# Patient Record
Sex: Male | Born: 1945
Health system: Southern US, Community
[De-identification: ages and names within clinical notes are randomized; demographics above are authoritative.]

## PROBLEM LIST (undated history)

## (undated) DIAGNOSIS — K769 Liver disease, unspecified: Secondary | ICD-10-CM

## (undated) DIAGNOSIS — N400 Enlarged prostate without lower urinary tract symptoms: Secondary | ICD-10-CM

## (undated) DIAGNOSIS — I10 Essential (primary) hypertension: Secondary | ICD-10-CM

## (undated) DIAGNOSIS — K219 Gastro-esophageal reflux disease without esophagitis: Secondary | ICD-10-CM

## (undated) DIAGNOSIS — M1990 Unspecified osteoarthritis, unspecified site: Secondary | ICD-10-CM

## (undated) DIAGNOSIS — M109 Gout, unspecified: Secondary | ICD-10-CM

## (undated) DIAGNOSIS — E039 Hypothyroidism, unspecified: Secondary | ICD-10-CM

## (undated) DIAGNOSIS — M542 Cervicalgia: Secondary | ICD-10-CM

## (undated) DIAGNOSIS — R2689 Other abnormalities of gait and mobility: Secondary | ICD-10-CM

## (undated) DIAGNOSIS — K5792 Diverticulitis of intestine, part unspecified, without perforation or abscess without bleeding: Secondary | ICD-10-CM

## (undated) HISTORY — PX: CHOLECYSTECTOMY: SHX55

## (undated) HISTORY — DX: Cervicalgia: M54.2

## (undated) HISTORY — PX: HERNIA REPAIR: SHX51

## (undated) HISTORY — DX: Liver disease, unspecified: K76.9

## (undated) HISTORY — DX: Other abnormalities of gait and mobility: R26.89

---

## 2010-06-01 ENCOUNTER — Emergency Department (HOSPITAL_COMMUNITY): Admission: EM | Admit: 2010-06-01 | Discharge: 2010-06-01 | Payer: Self-pay | Admitting: Emergency Medicine

## 2014-04-30 ENCOUNTER — Emergency Department (HOSPITAL_COMMUNITY)
Admission: EM | Admit: 2014-04-30 | Discharge: 2014-05-01 | Disposition: A | Discharge status: Home / Self Care | Payer: Medicare Other | Attending: Emergency Medicine | Admitting: Emergency Medicine

## 2014-04-30 DIAGNOSIS — R1013 Epigastric pain: Secondary | ICD-10-CM | POA: Insufficient documentation

## 2014-04-30 DIAGNOSIS — R112 Nausea with vomiting, unspecified: Secondary | ICD-10-CM

## 2014-04-30 DIAGNOSIS — Z880 Allergy status to penicillin: Secondary | ICD-10-CM | POA: Diagnosis not present

## 2014-04-30 DIAGNOSIS — I10 Essential (primary) hypertension: Secondary | ICD-10-CM | POA: Diagnosis not present

## 2014-04-30 DIAGNOSIS — R197 Diarrhea, unspecified: Secondary | ICD-10-CM | POA: Insufficient documentation

## 2014-04-30 DIAGNOSIS — R1012 Left upper quadrant pain: Secondary | ICD-10-CM | POA: Insufficient documentation

## 2014-04-30 DIAGNOSIS — R6883 Chills (without fever): Secondary | ICD-10-CM | POA: Diagnosis not present

## 2014-04-30 DIAGNOSIS — R1011 Right upper quadrant pain: Secondary | ICD-10-CM | POA: Insufficient documentation

## 2014-04-30 NOTE — ED Notes (Signed)
Pt states it isn't really pain but uncomfortable,  He vomited enroute and was given Zofran 4 mg IV

## 2014-04-30 NOTE — ED Notes (Signed)
Bed: HK06 Expected date:  Expected time:  Means of arrival:  Comments: EMS/65yoM/epigastric pain/nausea

## 2014-04-30 NOTE — ED Provider Notes (Signed)
CSN: [id number]     Arrival date & time 04/30/14  2325 History   First MD Initiated Contact with Patient 04/30/14 2333     Chief Complaint  Patient presents with  . Nausea  . Emesis  . Abdominal Pain   HPI  History provided by the patient. The patient is a 69 year old male with history of hypertension, cholecystectomy who presents with complaints of epigastric pain. The pains have been worsening for the past few months. Patient states they feel very similar to the pains he was having one year ago prior to his cholecystectomy. States at that time he was having similar pains with nausea and vomiting and when he went to the hospital they found that he was having problems with his gallbladder, liver and pancreas. Since his surgery he generally was doing well but over the last few months he has had similar pressure and pain and discomfort in his epigastric area. He has been drinking Coca-Cola or other carbonated beverages and belching which relieves the discomfort temporarily. He has had several episodes of nausea and vomiting however this evening prior to arrival. He also reports slight chills. Denies fever. Denies any other aggravating or alleviating factors.     No past medical history on file. No past surgical history on file. No family history on file. History  Substance Use Topics  . Smoking status: Not on file  . Smokeless tobacco: Not on file  . Alcohol Use: Not on file    Review of Systems  Constitutional: Positive for chills. Negative for fever.  Respiratory: Negative for shortness of breath.   Cardiovascular: Negative for chest pain.  Gastrointestinal: Positive for nausea, vomiting and abdominal pain. Negative for diarrhea and constipation.  All other systems reviewed and are negative.     Allergies  Penicillins  Home Medications   Prior to Admission medications   Not on File   BP 123/73  Pulse 99  Temp(Src) 97.7 F (36.5 C) (Oral)  Resp 16  SpO2 93% Physical  Exam  Nursing note and vitals reviewed. Constitutional: He is oriented to person, place, and time. He appears well-developed and well-nourished. No distress.  HENT:  Head: Normocephalic.  Cardiovascular: Normal rate and regular rhythm.   Pulmonary/Chest: Effort normal and breath sounds normal. No respiratory distress.  Abdominal: Soft. There is tenderness in the right upper quadrant, epigastric area and left upper quadrant. There is no rigidity, no rebound and no guarding.  Musculoskeletal: Normal range of motion.  Neurological: He is alert and oriented to person, place, and time.  Skin: Skin is warm.  Psychiatric: He has a normal mood and affect. His behavior is normal.    ED Course  Procedures   COORDINATION OF CARE:  Nursing notes reviewed. Vital signs reviewed. Initial pt interview and examination performed.   Filed Vitals:   04/30/14 2326 04/30/14 2329  BP:  123/73  Pulse:  99  Temp:  97.7 F (36.5 C)  TempSrc:  Oral  Resp:  16  SpO2: 98% 93%    11:47 PM- patient seen and evaluated. Patient appears in mild discomfort. Afebrile. Unremarkable vital signs.   Patient feeling improved after medications. Now resting comfortably. Able to tolerate by mouth fluids. No concerning findings on CT scan. Patient is already aware of his one kidney.  Treatment plan initiated: Medications  sodium chloride 0.9 % bolus 1,000 mL (1,000 mLs Intravenous New Bag/Given 05/01/14 0020)  pantoprazole (PROTONIX) injection 40 mg (40 mg Intravenous Given 05/01/14 0020)  ondansetron (ZOFRAN) injection  4 mg (4 mg Intravenous Given 05/01/14 0022)  iohexol (OMNIPAQUE) 300 MG/ML solution 50 mL (50 mLs Oral Contrast Given 05/01/14 0015)  iohexol (OMNIPAQUE) 300 MG/ML solution 100 mL (100 mLs Intravenous Contrast Given 05/01/14 0115)     Results for orders placed during the hospital encounter of 04/30/14  CBC WITH DIFFERENTIAL      Result Value Ref Range   WBC 13.4 (*) 4.0 - 10.5 K/uL   RBC 4.50  4.22 - 5.81  MIL/uL   Hemoglobin 14.1  13.0 - 17.0 g/dL   HCT 40.3  39.0 - 52.0 %   MCV 89.6  78.0 - 100.0 fL   MCH 31.3  26.0 - 34.0 pg   MCHC 35.0  30.0 - 36.0 g/dL   RDW 12.3  11.5 - 15.5 %   Platelets 212  150 - 400 K/uL   Neutrophils Relative % 83 (*) 43 - 77 %   Neutro Abs 11.1 (*) 1.7 - 7.7 K/uL   Lymphocytes Relative 7 (*) 12 - 46 %   Lymphs Abs 1.0  0.7 - 4.0 K/uL   Monocytes Relative 9  3 - 12 %   Monocytes Absolute 1.2 (*) 0.1 - 1.0 K/uL   Eosinophils Relative 1  0 - 5 %   Eosinophils Absolute 0.1  0.0 - 0.7 K/uL   Basophils Relative 0  0 - 1 %   Basophils Absolute 0.0  0.0 - 0.1 K/uL  COMPREHENSIVE METABOLIC PANEL      Result Value Ref Range   Sodium 140  137 - 147 mEq/L   Potassium 3.9  3.7 - 5.3 mEq/L   Chloride 102  96 - 112 mEq/L   CO2 24  19 - 32 mEq/L   Glucose, Bld 108 (*) 70 - 99 mg/dL   BUN 17  6 - 23 mg/dL   Creatinine, Ser 1.09  0.50 - 1.35 mg/dL   Calcium 9.5  8.4 - 10.5 mg/dL   Total Protein 7.2  6.0 - 8.3 g/dL   Albumin 3.9  3.5 - 5.2 g/dL   AST 17  0 - 37 U/L   ALT 12  0 - 53 U/L   Alkaline Phosphatase 98  39 - 117 U/L   Total Bilirubin 0.4  0.3 - 1.2 mg/dL   GFR calc non Af Amer 68 (*) >90 mL/min   GFR calc Af Amer 79 (*) >90 mL/min   Anion gap 14  5 - 15  LIPASE, BLOOD      Result Value Ref Range   Lipase 44  11 - 59 U/L    In the Imaging Review Ct Abdomen Pelvis W Contrast  05/01/2014   CLINICAL DATA:  Abdominal pain, nausea and vomiting. Patient states he was born with only 1 kidney.  EXAM: CT ABDOMEN AND PELVIS WITH CONTRAST  TECHNIQUE: Multidetector CT imaging of the abdomen and pelvis was performed using the standard protocol following bolus administration of intravenous contrast.  CONTRAST:  64mL OMNIPAQUE IOHEXOL 300 MG/ML SOLN, 133mL OMNIPAQUE IOHEXOL 300 MG/ML SOLN  COMPARISON:  02/28/2007  FINDINGS: Lung bases demonstrate multiple small calcified pleural plaques.  Poorly defined low-attenuation area in segment 5 of the liver appears unchanged  since previous study and probably represents a hemangioma. Surgical absence of the gallbladder. The pancreas, spleen, adrenal glands, abdominal aorta, inferior vena cava, and retroperitoneal lymph nodes are unremarkable. Cysts in the lower pole of the right kidney. Focal scarring in the right kidney. No hydronephrosis. Left kidney is not identified  consistent with history of congenital absence. Stomach and small bowel are unremarkable. Diverticulosis of the colon. No colonic distention or wall thickening. Prominent visceral adipose tissues. No free air or free fluid in the abdomen.  Pelvis: Lipoma in the right flank muscles. Diverticulosis of sigmoid colon without evidence of diverticulitis. The appendix is normal. No free or loculated pelvic fluid collections. No pelvic mass or lymphadenopathy. There is a small bladder diverticulum off of the right posterior superior aspect. No significant bladder wall thickening. Vertebral hemangioma at T12. No destructive bone lesions appreciated.  IMPRESSION: No definite acute process demonstrated in the abdomen or pelvis. Congenital absence of the left kidney. Diverticulosis coli without inflammatory changes. Bladder diverticulum. Right flank lipoma. Presumed cavernous hemangioma in the liver.   Electronically Signed   By: Lucienne Capers M.D.   On: 05/01/2014 01:38     EKG Interpretation   Date/Time:  Friday April 30 2014 23:33:23 EDT Ventricular Rate:  94 PR Interval:  153 QRS Duration: 87 QT Interval:  366 QTC Calculation: 458 R Axis:   13 Text Interpretation:  Sinus rhythm Atrial premature complex Borderline T  abnormalities, inferior leads Confirmed by Beau Fanny  MD, DOUGLAS ([id number]) on  05/01/2014 12:14:47 AM      MDM   Final diagnoses:  Nausea vomiting and diarrhea      Martie Lee, PA-C 05/01/14 [phone]

## 2014-05-01 ENCOUNTER — Emergency Department (HOSPITAL_COMMUNITY): Payer: Medicare Other

## 2014-05-01 LAB — CBC WITH DIFFERENTIAL/PLATELET
BASOS ABS: 0 10*3/uL (ref 0.0–0.1)
Basophils Relative: 0 % (ref 0–1)
Eosinophils Absolute: 0.1 10*3/uL (ref 0.0–0.7)
Eosinophils Relative: 1 % (ref 0–5)
HCT: 40.3 % (ref 39.0–52.0)
HEMOGLOBIN: 14.1 g/dL (ref 13.0–17.0)
Lymphocytes Relative: 7 % — ABNORMAL LOW (ref 12–46)
Lymphs Abs: 1 10*3/uL (ref 0.7–4.0)
MCH: 31.3 pg (ref 26.0–34.0)
MCHC: 35 g/dL (ref 30.0–36.0)
MCV: 89.6 fL (ref 78.0–100.0)
Monocytes Absolute: 1.2 10*3/uL — ABNORMAL HIGH (ref 0.1–1.0)
Monocytes Relative: 9 % (ref 3–12)
NEUTROS ABS: 11.1 10*3/uL — AB (ref 1.7–7.7)
NEUTROS PCT: 83 % — AB (ref 43–77)
Platelets: 212 10*3/uL (ref 150–400)
RBC: 4.5 MIL/uL (ref 4.22–5.81)
RDW: 12.3 % (ref 11.5–15.5)
WBC: 13.4 10*3/uL — AB (ref 4.0–10.5)

## 2014-05-01 LAB — LIPASE, BLOOD: Lipase: 44 U/L (ref 11–59)

## 2014-05-01 LAB — COMPREHENSIVE METABOLIC PANEL
ALBUMIN: 3.9 g/dL (ref 3.5–5.2)
ALK PHOS: 98 U/L (ref 39–117)
ALT: 12 U/L (ref 0–53)
AST: 17 U/L (ref 0–37)
Anion gap: 14 (ref 5–15)
BILIRUBIN TOTAL: 0.4 mg/dL (ref 0.3–1.2)
BUN: 17 mg/dL (ref 6–23)
CHLORIDE: 102 meq/L (ref 96–112)
CO2: 24 mEq/L (ref 19–32)
Calcium: 9.5 mg/dL (ref 8.4–10.5)
Creatinine, Ser: 1.09 mg/dL (ref 0.50–1.35)
GFR calc Af Amer: 79 mL/min — ABNORMAL LOW (ref >90)
GFR calc non Af Amer: 68 mL/min — ABNORMAL LOW (ref >90)
Glucose, Bld: 108 mg/dL — ABNORMAL HIGH (ref 70–99)
POTASSIUM: 3.9 meq/L (ref 3.7–5.3)
Sodium: 140 mEq/L (ref 137–147)
Total Protein: 7.2 g/dL (ref 6.0–8.3)

## 2014-05-01 MED ORDER — PANTOPRAZOLE SODIUM 40 MG IV SOLR
40.0000 mg | Freq: Once | INTRAVENOUS | Status: AC
  Administered 2014-05-01: 40 mg via INTRAVENOUS
  Filled 2014-05-01: qty 40

## 2014-05-01 MED ORDER — ONDANSETRON 8 MG PO TBDP: ORAL_TABLET | ORAL | Status: DC

## 2014-05-01 MED ORDER — SODIUM CHLORIDE 0.9 % IV BOLUS (SEPSIS)
1000.0000 mL | Freq: Once | INTRAVENOUS | Status: AC
  Administered 2014-05-01: 1000 mL via INTRAVENOUS

## 2014-05-01 MED ORDER — IOHEXOL 300 MG/ML  SOLN
100.0000 mL | Freq: Once | INTRAMUSCULAR | Status: AC | PRN
  Administered 2014-05-01: 100 mL via INTRAVENOUS

## 2014-05-01 MED ORDER — IOHEXOL 300 MG/ML  SOLN
50.0000 mL | Freq: Once | INTRAMUSCULAR | Status: AC | PRN
  Administered 2014-05-01: 50 mL via ORAL

## 2014-05-01 MED ORDER — ONDANSETRON HCL 4 MG/2ML IJ SOLN
4.0000 mg | Freq: Once | INTRAMUSCULAR | Status: AC
  Administered 2014-05-01: 4 mg via INTRAVENOUS
  Filled 2014-05-01: qty 2

## 2014-05-01 NOTE — ED Notes (Signed)
Patient transported to CT 

## 2014-05-01 NOTE — ED Notes (Signed)
Pt ambulated to bathroom 

## 2014-05-01 NOTE — ED Notes (Signed)
Pt is currently sleeping in NAD

## 2014-05-01 NOTE — ED Provider Notes (Signed)
Medical screening examination/treatment/procedure(s) were performed by non-physician practitioner and as supervising physician I was immediately available for consultation/collaboration.   EKG Interpretation   Date/Time:  Friday April 30 2014 23:33:23 EDT Ventricular Rate:  94 PR Interval:  153 QRS Duration: 87 QT Interval:  366 QTC Calculation: 458 R Axis:   13 Text Interpretation:  Sinus rhythm Atrial premature complex Borderline T  abnormalities, inferior leads Confirmed by Beau Fanny  MD, Shalinda Burkholder ([id number]) on  05/01/2014 12:14:47 AM       Veryl Speak, MD 05/01/14 1350

## 2014-05-01 NOTE — Discharge Instructions (Signed)
Your laboratory testing and a CT scan did not show any concerning or emergent causes for your abdominal pain, nausea, vomiting diarrhea. Please followup with a primary care provider for continued evaluation and treatment. Drink plenty of fluids to stay hydrated. Return any time for changing or worsening symptoms.    Nausea and Vomiting Nausea is a sick feeling that often comes before throwing up (vomiting). Vomiting is a reflex where stomach contents come out of your mouth. Vomiting can cause severe loss of body fluids (dehydration). Children and elderly adults can become dehydrated quickly, especially if they also have diarrhea. Nausea and vomiting are symptoms of a condition or disease. It is important to find the cause of your symptoms. CAUSES   Direct irritation of the stomach lining. This irritation can result from increased acid production (gastroesophageal reflux disease), infection, food poisoning, taking certain medicines (such as nonsteroidal anti-inflammatory drugs), alcohol use, or tobacco use.  Signals from the brain.These signals could be caused by a headache, heat exposure, an inner ear disturbance, increased pressure in the brain from injury, infection, a tumor, or a concussion, pain, emotional stimulus, or metabolic problems.  An obstruction in the gastrointestinal tract (bowel obstruction).  Illnesses such as diabetes, hepatitis, gallbladder problems, appendicitis, kidney problems, cancer, sepsis, atypical symptoms of a heart attack, or eating disorders.  Medical treatments such as chemotherapy and radiation.  Receiving medicine that makes you sleep (general anesthetic) during surgery. DIAGNOSIS Your caregiver may ask for tests to be done if the problems do not improve after a few days. Tests may also be done if symptoms are severe or if the reason for the nausea and vomiting is not clear. Tests may include:  Urine tests.  Blood tests.  Stool tests.  Cultures (to look  for evidence of infection).  X-rays or other imaging studies. Test results can help your caregiver make decisions about treatment or the need for additional tests. TREATMENT You need to stay well hydrated. Drink frequently but in small amounts.You may wish to drink water, sports drinks, clear broth, or eat frozen ice pops or gelatin dessert to help stay hydrated.When you eat, eating slowly may help prevent nausea.There are also some antinausea medicines that may help prevent nausea. HOME CARE INSTRUCTIONS   Take all medicine as directed by your caregiver.  If you do not have an appetite, do not force yourself to eat. However, you must continue to drink fluids.  If you have an appetite, eat a normal diet unless your caregiver tells you differently.  Eat a variety of complex carbohydrates (rice, wheat, potatoes, bread), lean meats, yogurt, fruits, and vegetables.  Avoid high-fat foods because they are more difficult to digest.  Drink enough water and fluids to keep your urine clear or pale yellow.  If you are dehydrated, ask your caregiver for specific rehydration instructions. Signs of dehydration may include:  Severe thirst.  Dry lips and mouth.  Dizziness.  Dark urine.  Decreasing urine frequency and amount.  Confusion.  Rapid breathing or pulse. SEEK IMMEDIATE MEDICAL CARE IF:   You have blood or brown flecks (like coffee grounds) in your vomit.  You have black or bloody stools.  You have a severe headache or stiff neck.  You are confused.  You have severe abdominal pain.  You have chest pain or trouble breathing.  You do not urinate at least once every 8 hours.  You develop cold or clammy skin.  You continue to vomit for longer than 24 to 48 hours.  You have a fever. MAKE SURE YOU:   Understand these instructions.  Will watch your condition.  Will get help right away if you are not doing well or get worse. Document Released: 08/13/2005 Document  Revised: 11/05/2011 Document Reviewed: 01/10/2011 [facility] Patient Information 2015 [place], [place]. This information is not intended to replace advice given to you by your health care provider. Make sure you discuss any questions you have with your health care provider.   Abdominal Pain Many things can cause abdominal pain. Usually, abdominal pain is not caused by a disease and will improve without treatment. It can often be observed and treated at home. Your health care provider will do a physical exam and possibly order blood tests and X-rays to help determine the seriousness of your pain. However, in many cases, more time must pass before a clear cause of the pain can be found. Before that point, your health care provider may not know if you need more testing or further treatment. HOME CARE INSTRUCTIONS  Monitor your abdominal pain for any changes. The following actions may help to alleviate any discomfort you are experiencing:  Only take over-the-counter or prescription medicines as directed by your health care provider.  Do not take laxatives unless directed to do so by your health care provider.  Try a clear liquid diet (broth, tea, or water) as directed by your health care provider. Slowly move to a bland diet as tolerated. SEEK MEDICAL CARE IF:  You have unexplained abdominal pain.  You have abdominal pain associated with nausea or diarrhea.  You have pain when you urinate or have a bowel movement.  You experience abdominal pain that wakes you in the night.  You have abdominal pain that is worsened or improved by eating food.  You have abdominal pain that is worsened with eating fatty foods.  You have a fever. SEEK IMMEDIATE MEDICAL CARE IF:   Your pain does not go away within 2 hours.  You keep throwing up (vomiting).  Your pain is felt only in portions of the abdomen, such as the right side or the left lower portion of the abdomen.  You pass bloody or black tarry  stools. MAKE SURE YOU:  Understand these instructions.   Will watch your condition.   Will get help right away if you are not doing well or get worse.  Document Released: 05/23/2005 Document Revised: 08/18/2013 Document Reviewed: 04/22/2013 [facility] Patient Information 2015 [place], [place]. This information is not intended to replace advice given to you by your health care provider. Make sure you discuss any questions you have with your health care provider.

## 2014-10-06 ENCOUNTER — Inpatient Hospital Stay (HOSPITAL_COMMUNITY)
Admission: EM | Admit: 2014-10-06 | Discharge: 2014-10-08 | DRG: 074 | Disposition: A | Discharge status: Home / Self Care | Payer: Medicare Other | Attending: Internal Medicine | Admitting: Internal Medicine

## 2014-10-06 ENCOUNTER — Encounter (HOSPITAL_COMMUNITY): Payer: Self-pay | Admitting: *Deleted

## 2014-10-06 ENCOUNTER — Emergency Department (HOSPITAL_COMMUNITY): Payer: Medicare Other

## 2014-10-06 DIAGNOSIS — M109 Gout, unspecified: Secondary | ICD-10-CM | POA: Diagnosis present

## 2014-10-06 DIAGNOSIS — G8190 Hemiplegia, unspecified affecting unspecified side: Secondary | ICD-10-CM | POA: Diagnosis not present

## 2014-10-06 DIAGNOSIS — I10 Essential (primary) hypertension: Secondary | ICD-10-CM | POA: Diagnosis present

## 2014-10-06 DIAGNOSIS — G5731 Lesion of lateral popliteal nerve, right lower limb: Principal | ICD-10-CM | POA: Diagnosis present

## 2014-10-06 DIAGNOSIS — R29898 Other symptoms and signs involving the musculoskeletal system: Secondary | ICD-10-CM | POA: Diagnosis not present

## 2014-10-06 DIAGNOSIS — Z9049 Acquired absence of other specified parts of digestive tract: Secondary | ICD-10-CM | POA: Diagnosis present

## 2014-10-06 DIAGNOSIS — R946 Abnormal results of thyroid function studies: Secondary | ICD-10-CM | POA: Diagnosis present

## 2014-10-06 DIAGNOSIS — M2141 Flat foot [pes planus] (acquired), right foot: Secondary | ICD-10-CM | POA: Diagnosis not present

## 2014-10-06 DIAGNOSIS — M21371 Foot drop, right foot: Secondary | ICD-10-CM | POA: Diagnosis present

## 2014-10-06 DIAGNOSIS — K219 Gastro-esophageal reflux disease without esophagitis: Secondary | ICD-10-CM | POA: Diagnosis present

## 2014-10-06 DIAGNOSIS — Z880 Allergy status to penicillin: Secondary | ICD-10-CM | POA: Diagnosis not present

## 2014-10-06 DIAGNOSIS — G8191 Hemiplegia, unspecified affecting right dominant side: Secondary | ICD-10-CM | POA: Diagnosis not present

## 2014-10-06 DIAGNOSIS — G459 Transient cerebral ischemic attack, unspecified: Secondary | ICD-10-CM | POA: Diagnosis not present

## 2014-10-06 DIAGNOSIS — R531 Weakness: Secondary | ICD-10-CM | POA: Diagnosis not present

## 2014-10-06 HISTORY — DX: Gout, unspecified: M10.9

## 2014-10-06 HISTORY — DX: Essential (primary) hypertension: I10

## 2014-10-06 HISTORY — DX: Gastro-esophageal reflux disease without esophagitis: K21.9

## 2014-10-06 LAB — I-STAT CHEM 8, ED
BUN: 12 mg/dL (ref 6–23)
CREATININE: 0.9 mg/dL (ref 0.50–1.35)
Calcium, Ion: 1.13 mmol/L (ref 1.13–1.30)
Chloride: 102 mmol/L (ref 96–112)
Glucose, Bld: 85 mg/dL (ref 70–99)
HCT: 39 % (ref 39.0–52.0)
Hemoglobin: 13.3 g/dL (ref 13.0–17.0)
POTASSIUM: 4 mmol/L (ref 3.5–5.1)
SODIUM: 142 mmol/L (ref 135–145)
TCO2: 23 mmol/L (ref 0–100)

## 2014-10-06 LAB — BASIC METABOLIC PANEL
ANION GAP: 12 (ref 5–15)
BUN: 15 mg/dL (ref 6–23)
CALCIUM: 9.6 mg/dL (ref 8.4–10.5)
CO2: 26 mmol/L (ref 19–32)
Chloride: 112 mmol/L (ref 96–112)
Creatinine, Ser: 1 mg/dL (ref 0.50–1.35)
GFR calc Af Amer: 88 mL/min — ABNORMAL LOW (ref >90)
GFR, EST NON AFRICAN AMERICAN: 76 mL/min — AB (ref >90)
Glucose, Bld: 102 mg/dL — ABNORMAL HIGH (ref 70–99)
POTASSIUM: 4.7 mmol/L (ref 3.5–5.1)
SODIUM: 150 mmol/L — AB (ref 135–145)

## 2014-10-06 LAB — CBC
HEMATOCRIT: 40.4 % (ref 39.0–52.0)
HEMOGLOBIN: 14.4 g/dL (ref 13.0–17.0)
MCH: 33.7 pg (ref 26.0–34.0)
MCHC: 35.6 g/dL (ref 30.0–36.0)
MCV: 94.6 fL (ref 78.0–100.0)
Platelets: 230 10*3/uL (ref 150–400)
RBC: 4.27 MIL/uL (ref 4.22–5.81)
RDW: 11.9 % (ref 11.5–15.5)
WBC: 9.6 10*3/uL (ref 4.0–10.5)

## 2014-10-06 LAB — CBG MONITORING, ED: Glucose-Capillary: 92 mg/dL (ref 70–99)

## 2014-10-06 LAB — I-STAT TROPONIN, ED: Troponin i, poc: 0.01 ng/mL (ref 0.00–0.08)

## 2014-10-06 MED ORDER — SODIUM CHLORIDE 0.9 % IV BOLUS (SEPSIS)
1000.0000 mL | Freq: Once | INTRAVENOUS | Status: AC
  Administered 2014-10-06: 1000 mL via INTRAVENOUS

## 2014-10-06 NOTE — Progress Notes (Signed)
EDCM spoke to patient at bedside.  Patient confirms he has [employer] and assistance through the [place].  Patient confirms his pcp is Dr. Adele Dan at [facility] [place] [facility] in [place] updated.

## 2014-10-06 NOTE — H&P (Signed)
[facility] [place]  Chief Complaint:  Right leg weakness  HPI: Ross Becker is a 69 y.o. male   has a past medical history of GERD (gastroesophageal reflux disease); Gout; and Hypertension.   Presented with  Patient noticed his balance was off few days ago. He have had a headache in the back of his head for the past 24 hours. When he got up in AM he noticed that his right leg was weak and numb. Denies any arm weakness.  He noticed that he has had some memory lapses for the past few days. No speech problems. Neurology was consulted and reccomends admission to [place] was called for admission for Possible TIA versus CVA  Review of Systems:    Pertinent positives include: ataxia, left leg weakness  Constitutional:  No weight loss, night sweats, Fevers, chills, fatigue, weight loss  HEENT:  No headaches, Difficulty swallowing,Tooth/dental problems,Sore throat,  No sneezing, itching, ear ache, nasal congestion, post nasal drip,  Cardio-vascular:  No chest pain, Orthopnea, PND, anasarca, dizziness, palpitations.no Bilateral lower extremity swelling  GI:  No heartburn, indigestion, abdominal pain, nausea, vomiting, diarrhea, change in bowel habits, loss of appetite, melena, blood in stool, hematemesis Resp:  no shortness of breath at rest. No dyspnea on exertion, No excess mucus, no productive cough, No non-productive cough, No coughing up of blood.No change in color of mucus.No wheezing. Skin:  no rash or lesions. No jaundice GU:  no dysuria, change in color of urine, no urgency or frequency. No straining to urinate.  No flank pain.  Musculoskeletal:  No joint pain or no joint swelling. No decreased range of motion. No back pain.  Psych:  No change in mood or affect. No depression or anxiety. No memory loss.  Neuro: no localizing neurological complaints, no tingling, no weakness, no double vision, no gait abnormality, no slurred speech, no confusion  Otherwise ROS  are negative except for above, 10 systems were reviewed  Past Medical History: Past Medical History  Diagnosis Date  . GERD (gastroesophageal reflux disease)   . Gout   . Hypertension    Past Surgical History  Procedure Laterality Date  . Cholecystectomy    . Hernia repair       Medications: Prior to Admission medications   Medication Sig Start Date End Date Taking? Authorizing Provider  acetaminophen (TYLENOL) 325 MG tablet Take 650 mg by mouth every 6 (six) hours as needed for mild pain.   Yes Historical Provider, MD  cholecalciferol (VITAMIN D) 1000 UNITS tablet Take 1,000 Units by mouth daily.   Yes Historical Provider, MD  colchicine 0.6 MG tablet Take 0.6 mg by mouth daily as needed. For gout   Yes Historical Provider, MD  cyanocobalamin 1000 MCG tablet Take 100 mcg by mouth daily.   Yes Historical Provider, MD  enalapril (VASOTEC) 20 MG tablet Take 10 mg by mouth daily.   Yes Historical Provider, MD  omeprazole (PRILOSEC) 20 MG capsule Take 40 mg by mouth 2 (two) times daily before a meal.    Yes Historical Provider, MD  terazosin (HYTRIN) 2 MG capsule Take 8 mg by mouth at bedtime.   Yes Historical Provider, MD  ondansetron (ZOFRAN ODT) 8 MG disintegrating tablet 8mg  ODT q4 hours prn nausea Patient not taking: Reported on 10/06/2014 05/01/14   Ruthell Rummage Dammen, PA-C  propranolol (INDERAL) 20 MG tablet Take 20 mg by mouth 2 (two) times daily as needed. For migraines    Historical Provider, MD  Allergies:   Allergies  Allergen Reactions  . Penicillins Other (See Comments)    "childhood allergy"    Social History:  Ambulatory  independently   Lives at home alone,         reports that he has never smoked. He does not have any smokeless tobacco history on file. He reports that he drinks alcohol. He reports that he does not use illicit drugs.    Family History: family history includes Brain cancer in his mother; Cancer in his father.    Physical Exam: Patient  Vitals for the past 24 hrs:  BP Temp Temp src Pulse Resp SpO2  10/06/14 1946 132/94 mmHg 98 F (36.7 C) Oral 111 16 98 %    1. General:  in No Acute distress 2. Psychological: Alert and   Oriented 3. Head/ENT:   Moist   Mucous Membranes                          Head Non traumatic, neck supple                          Normal  Dentition 4. SKIN: normal   Skin turgor,  Skin clean Dry and intact no rash 5. Heart: Regular rate and rhythm no Murmur, Rub or gallop 6. Lungs: Clear to auscultation bilaterally, no wheezes or crackles   7. Abdomen: Soft, non-tender, Non distended 8. Lower extremities: no clubbing, cyanosis, or edema 9. Neurologically strength diminished on the right. Right upper extremity drift mild also present. Cranial nerves II through XII intact. Right foot drop noted  10. MSK: Normal range of motion  body mass index is unknown because there is no height or weight on file.   Labs on Admission:   Results for orders placed or performed during the hospital encounter of 10/06/14 (from the past 24 hour(s))  Basic metabolic panel  (at [facility])     Status: Abnormal   Collection Time: 10/06/14  8:16 PM  Result Value Ref Range   Sodium 150 (H) 135 - 145 mmol/L   Potassium 4.7 3.5 - 5.1 mmol/L   Chloride 112 96 - 112 mmol/L   CO2 26 19 - 32 mmol/L   Glucose, Bld 102 (H) 70 - 99 mg/dL   BUN 15 6 - 23 mg/dL   Creatinine, Ser 1.00 0.50 - 1.35 mg/dL   Calcium 9.6 8.4 - 10.5 mg/dL   GFR calc non Af Amer 76 (L) >90 mL/min   GFR calc Af Amer 88 (L) >90 mL/min   Anion gap 12 5 - 15  CBC  (at [facility])     Status: None   Collection Time: 10/06/14  8:16 PM  Result Value Ref Range   WBC 9.6 4.0 - 10.5 K/uL   RBC 4.27 4.22 - 5.81 MIL/uL   Hemoglobin 14.4 13.0 - 17.0 g/dL   HCT 40.4 39.0 - 52.0 %   MCV 94.6 78.0 - 100.0 fL   MCH 33.7 26.0 - 34.0 pg   MCHC 35.6 30.0 - 36.0 g/dL   RDW 11.9 11.5 - 15.5 %   Platelets 230 150 - 400 K/uL  CBG, ED     Status:  None   Collection Time: 10/06/14  8:19 PM  Result Value Ref Range   Glucose-Capillary 92 70 - 99 mg/dL  I-stat troponin, ED     Status: None   Collection Time:  10/06/14  9:09 PM  Result Value Ref Range   Troponin i, poc 0.01 0.00 - 0.08 ng/mL   Comment 3          I-stat chem 8, ed     Status: None   Collection Time: 10/06/14 10:36 PM  Result Value Ref Range   Sodium 142 135 - 145 mmol/L   Potassium 4.0 3.5 - 5.1 mmol/L   Chloride 102 96 - 112 mmol/L   BUN 12 6 - 23 mg/dL   Creatinine, Ser 0.90 0.50 - 1.35 mg/dL   Glucose, Bld 85 70 - 99 mg/dL   Calcium, Ion 1.13 1.13 - 1.30 mmol/L   TCO2 23 0 - 100 mmol/L   Hemoglobin 13.3 13.0 - 17.0 g/dL   HCT 39.0 39.0 - 52.0 %    UA not obtained  No results found for: HGBA1C  CrCl cannot be calculated (Unknown ideal weight.).  BNP (last 3 results) No results for input(s): PROBNP in the last 8760 hours.  Other results:  I have pearsonaly reviewed this: ECG REPORT  Rate:118  Rhythm: ST ST&T Change: no ischemia some PVC's   There were no vitals filed for this visit.   Cultures: No results found for: SDES, SPECREQUEST, CULT, REPTSTATUS   Radiological Exams on Admission: Ct Head Wo Contrast  10/06/2014   CLINICAL DATA:  Right leg weakness, onset today.  EXAM: CT HEAD WITHOUT CONTRAST  TECHNIQUE: Contiguous axial images were obtained from the base of the skull through the vertex without intravenous contrast.  COMPARISON:  None.  FINDINGS: Ventricles and sulci appear symmetrical. No mass effect or midline shift. No abnormal extra-axial fluid collections. Gray-white matter junctions are distinct. Basal cisterns are not effaced. No evidence of acute intracranial hemorrhage. No depressed skull fractures. Visualized paranasal sinuses and mastoid air cells are not opacified.  IMPRESSION: No acute intracranial abnormalities.   Electronically Signed   By: Lucienne Capers M.D.   On: 10/06/2014 21:41    Chart has been  reviewed  Assessment/Plan  69 year-old gentleman history of hypertension presents with right-sided weakness being transferred to [facility]  Present on Admission:  . TIA (transient ischemic attack) -  - will admit based on TIA/CVA protocol, await results of MRA/MRI, Carotid Doppler and Echo, obtain cardiac enzymes,  ECG,   Lipid panel, TSH. Order PT/OT evaluation. Will make sure patient is on antiplatelet agent.   Neurology consult.     . Hypertension - permissive hypertension   Prophylaxis: SCD   CODE STATUS:  FULL CODE   Other plan as per orders.  I have spent a total of 55 min on this admission  Darnisha Vernet 10/06/2014, 11:34 PM  [facility]  Pager [phone]   after 2 AM please page floor coverage PA If 7AM-7PM, please contact the day team taking care of the patient  [email or web address]  Password [email or web address]

## 2014-10-06 NOTE — ED Provider Notes (Signed)
CSN: [id number]     Arrival date & time 10/06/14  1941 History   First MD Initiated Contact with Patient 10/06/14 2058     Chief Complaint  Patient presents with  . Extremity Weakness     (Consider location/radiation/quality/duration/timing/severity/associated sxs/prior Treatment) The history is provided by the patient.  Trenten Watchman is a 69 y.o. male hx of GERD, HTN, gout, here with dizziness, right leg weakness. He has been feeling dizzy for the last several days. He states that sometimes he feels imbalance and felt like he is on fall. Denies vertigo or near syncope. He went to bed late yesterday and woke up around 1 PM. He woke up and suddenly have right leg weakness. He states that when he walks he feels like he is dragging his right foot. Also has worsening dizziness. Denies any chest pain or shortness of breath. He had a fall in 2003 and was scheduled for MRI but never received it.    Past Medical History  Diagnosis Date  . GERD (gastroesophageal reflux disease)   . Gout   . Hypertension    Past Surgical History  Procedure Laterality Date  . Cholecystectomy    . Hernia repair     No family history on file. History  Substance Use Topics  . Smoking status: Never Smoker   . Smokeless tobacco: Not on file  . Alcohol Use: Yes     Comment: occasionally    Review of Systems  Musculoskeletal: Positive for extremity weakness.  Neurological: Positive for dizziness and weakness.  All other systems reviewed and are negative.     Allergies  Penicillins  Home Medications   Prior to Admission medications   Medication Sig Start Date End Date Taking? Authorizing Provider  acetaminophen (TYLENOL) 325 MG tablet Take 650 mg by mouth every 6 (six) hours as needed for mild pain.   Yes Historical Provider, MD  cholecalciferol (VITAMIN D) 1000 UNITS tablet Take 1,000 Units by mouth daily.   Yes Historical Provider, MD  colchicine 0.6 MG tablet Take 0.6 mg by mouth daily as  needed. For gout   Yes Historical Provider, MD  cyanocobalamin 1000 MCG tablet Take 100 mcg by mouth daily.   Yes Historical Provider, MD  enalapril (VASOTEC) 20 MG tablet Take 10 mg by mouth daily.   Yes Historical Provider, MD  omeprazole (PRILOSEC) 20 MG capsule Take 40 mg by mouth 2 (two) times daily before a meal.    Yes Historical Provider, MD  terazosin (HYTRIN) 2 MG capsule Take 8 mg by mouth at bedtime.   Yes Historical Provider, MD  ondansetron (ZOFRAN ODT) 8 MG disintegrating tablet 8mg  ODT q4 hours prn nausea Patient not taking: Reported on 10/06/2014 05/01/14   Ruthell Rummage Dammen, PA-C  propranolol (INDERAL) 20 MG tablet Take 20 mg by mouth 2 (two) times daily as needed. For migraines    Historical Provider, MD   BP 132/94 mmHg  Pulse 111  Temp(Src) 98 F (36.7 C) (Oral)  Resp 16  SpO2 98% Physical Exam  Constitutional: He is oriented to person, place, and time.  Uncomfortable   HENT:  Head: Normocephalic.  MM slightly dry   Eyes: Conjunctivae are normal. Pupils are equal, round, and reactive to light.  No nystagmus   Neck: Normal range of motion. Neck supple.  Cardiovascular: Regular rhythm and normal heart sounds.   Slightly tachy   Pulmonary/Chest: Effort normal and breath sounds normal. No respiratory distress. He has no wheezes. He has no rales.  Abdominal: Soft. Bowel sounds are normal. He exhibits no distension. There is no tenderness. There is no rebound and no guarding.  Musculoskeletal: Normal range of motion. He exhibits no edema or tenderness.  Neurological: He is alert and oriented to person, place, and time.  CN 2-12 intact. Strength 4/5 R leg. ? R foot drop. Drags right foot when ambulating. Neg rhomberg.   Skin: Skin is warm and dry.  Psychiatric: He has a normal mood and affect. His behavior is normal. Judgment and thought content normal.  Nursing note and vitals reviewed.   ED Course  Procedures (including critical care time) Labs Review Labs Reviewed   BASIC METABOLIC PANEL - Abnormal; Notable for the following:    Sodium 150 (*)    Glucose, Bld 102 (*)    GFR calc non Af Amer 76 (*)    GFR calc Af Amer 88 (*)    All other components within normal limits  CBC  CBG MONITORING, ED  I-STAT TROPOININ, ED  I-STAT CHEM 8, ED    Imaging Review Ct Head Wo Contrast  10/06/2014   CLINICAL DATA:  Right leg weakness, onset today.  EXAM: CT HEAD WITHOUT CONTRAST  TECHNIQUE: Contiguous axial images were obtained from the base of the skull through the vertex without intravenous contrast.  COMPARISON:  None.  FINDINGS: Ventricles and sulci appear symmetrical. No mass effect or midline shift. No abnormal extra-axial fluid collections. Gray-white matter junctions are distinct. Basal cisterns are not effaced. No evidence of acute intracranial hemorrhage. No depressed skull fractures. Visualized paranasal sinuses and mastoid air cells are not opacified.  IMPRESSION: No acute intracranial abnormalities.   Electronically Signed   By: Lucienne Capers M.D.   On: 10/06/2014 21:41     EKG Interpretation   Date/Time:  Wednesday October 06 2014 19:53:21 EST Ventricular Rate:  118 PR Interval:  146 QRS Duration: 85 QT Interval:  327 QTC Calculation: 458 R Axis:   37 Text Interpretation:  Sinus tachycardia Ventricular premature complex No  significant change since last tracing Confirmed by Micky Sheller  MD, Kamare Caspers ([id number])  on 10/06/2014 9:14:09 PM      MDM   Final diagnoses:  None   Furman Trentman is a 69 y.o. male here with dizziness, R leg weakness. I am concerned for posterior stroke. Since he is still weak, will likely need stroke workup inpatient. Will get CT head, labs. Will consult neuro and admit.   10:58 PM Discussed with Dr. Doy Mince who wants transfer to [facility]. Na 150 but recheck is 140 so likely lab error. Will admit.     Wandra Arthurs, MD 10/06/14 2259

## 2014-10-06 NOTE — ED Notes (Signed)
Pt reports waking up this am with R leg weakness.  Denies R arm weakness at this time.  He reports last known normal was when he went to bed around 0200 today.  Pt is A&Ox 4.  No facial droop or slurred speech noted at time.  Pt reports tingling sensation and cramping in his R calf area which is intermittent.  Pt also reports intermittent dizziness, worse when turning his head.  HE also reports intermittent sharp shooting pain behind his L eye.  Reports L eye injury from a fall  In 2003.

## 2014-10-07 ENCOUNTER — Inpatient Hospital Stay (HOSPITAL_COMMUNITY): Payer: Medicare Other

## 2014-10-07 DIAGNOSIS — M2141 Flat foot [pes planus] (acquired), right foot: Secondary | ICD-10-CM

## 2014-10-07 DIAGNOSIS — G5731 Lesion of lateral popliteal nerve, right lower limb: Principal | ICD-10-CM

## 2014-10-07 DIAGNOSIS — R29898 Other symptoms and signs involving the musculoskeletal system: Secondary | ICD-10-CM | POA: Diagnosis present

## 2014-10-07 LAB — URINALYSIS, ROUTINE W REFLEX MICROSCOPIC
Bilirubin Urine: NEGATIVE
Glucose, UA: NEGATIVE mg/dL
Hgb urine dipstick: NEGATIVE
KETONES UR: NEGATIVE mg/dL
LEUKOCYTES UA: NEGATIVE
Nitrite: NEGATIVE
PROTEIN: NEGATIVE mg/dL
Specific Gravity, Urine: 1.012 (ref 1.005–1.030)
UROBILINOGEN UA: 0.2 mg/dL (ref 0.0–1.0)
pH: 5.5 (ref 5.0–8.0)

## 2014-10-07 LAB — TSH: TSH: 6.163 u[IU]/mL — AB (ref 0.350–4.500)

## 2014-10-07 LAB — GLUCOSE, CAPILLARY
GLUCOSE-CAPILLARY: 100 mg/dL — AB (ref 70–99)
Glucose-Capillary: 112 mg/dL — ABNORMAL HIGH (ref 70–99)
Glucose-Capillary: 127 mg/dL — ABNORMAL HIGH (ref 70–99)
Glucose-Capillary: 97 mg/dL (ref 70–99)

## 2014-10-07 LAB — LIPID PANEL
CHOL/HDL RATIO: 2.1 ratio
Cholesterol: 112 mg/dL (ref 0–200)
HDL: 53 mg/dL (ref >39)
LDL CALC: 35 mg/dL (ref 0–99)
Triglycerides: 122 mg/dL (ref <150)
VLDL: 24 mg/dL (ref 0–40)

## 2014-10-07 MED ORDER — ACETAMINOPHEN 325 MG PO TABS: 650.0000 mg | ORAL_TABLET | ORAL | Status: DC | PRN

## 2014-10-07 MED ORDER — ENALAPRIL MALEATE 10 MG PO TABS
10.0000 mg | ORAL_TABLET | Freq: Every day | ORAL | Status: DC
  Administered 2014-10-07 – 2014-10-08 (×2): 10 mg via ORAL
  Filled 2014-10-07 (×2): qty 1

## 2014-10-07 MED ORDER — STROKE: EARLY STAGES OF RECOVERY BOOK
Freq: Once | Status: AC
  Administered 2014-10-07: 1
  Filled 2014-10-07: qty 1

## 2014-10-07 MED ORDER — TERAZOSIN HCL 5 MG PO CAPS
8.0000 mg | ORAL_CAPSULE | Freq: Every day | ORAL | Status: DC
  Administered 2014-10-07: 8 mg via ORAL
  Filled 2014-10-07 (×2): qty 1

## 2014-10-07 MED ORDER — SODIUM CHLORIDE 0.9 % IV SOLN
INTRAVENOUS | Status: DC
  Administered 2014-10-07: 03:00:00 via INTRAVENOUS

## 2014-10-07 MED ORDER — PANTOPRAZOLE SODIUM 40 MG PO TBEC
40.0000 mg | DELAYED_RELEASE_TABLET | Freq: Every day | ORAL | Status: DC
Start: 2014-10-07 — End: 2014-10-08
  Administered 2014-10-07 – 2014-10-08 (×2): 40 mg via ORAL
  Filled 2014-10-07 (×2): qty 1

## 2014-10-07 MED ORDER — TERAZOSIN HCL 5 MG PO CAPS
8.0000 mg | ORAL_CAPSULE | Freq: Once | ORAL | Status: AC
  Administered 2014-10-07: 8 mg via ORAL
  Filled 2014-10-07: qty 1

## 2014-10-07 MED ORDER — ASPIRIN 325 MG PO TABS
325.0000 mg | ORAL_TABLET | Freq: Every day | ORAL | Status: DC
  Administered 2014-10-07 – 2014-10-08 (×2): 325 mg via ORAL
  Filled 2014-10-07 (×2): qty 1

## 2014-10-07 MED ORDER — LORAZEPAM 2 MG/ML IJ SOLN
1.0000 mg | Freq: Once | INTRAMUSCULAR | Status: AC
  Administered 2014-10-07: 1 mg via INTRAVENOUS
  Filled 2014-10-07: qty 1

## 2014-10-07 NOTE — Progress Notes (Addendum)
[facility] PROGRESS NOTE  Ross Becker IYM:[id number] DOB: December 09, 1945 DOA: 10/06/2014 PCP: Historical Provider, MD  Assessment/Plan:  Right foot weakness MRI of the brain without contrast negative for any intracranial abnormality.  Patient on full dose aspirin.  Neurology recommended outpatient EMG and outpatient neurology follow-up ([facility] is [facility] may need to followup with [facility]).  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  Physical therapy recommended 1 more day off hospitalization for assessment as he lives by himself, suspect patient likely may be discharged home with a cane.  Hypertension Restart patient's home antihypertensive medications.  Stable.  Abnormal TSH Check free T4 in the morning.  Code Status: Full code. Family Communication: Patient updated at bedside.  Disposition Plan: Plan for discharge tomorrow.   Consultants:  Neurology  Procedures:  MRI Brain  Head CT  Antibiotics:  None.  HPI/Subjective: No specific concerns.  Still has right lower extremity weakness and numbness which seems to be improving.  Objective: Filed Vitals:   10/07/14 0234 10/07/14 0552 10/07/14 0816 10/07/14 1353  BP: 158/71 128/73 142/90 137/78  Pulse: 62 77    Temp: 98 F (36.7 C) 98.2 F (36.8 C) 97.5 F (36.4 C) 98.1 F (36.7 C)  TempSrc: Oral Oral Oral Oral  Resp: 16 16    Height: 5' 11.5" (1.816 m)     Weight: 85.14 kg (187 lb 11.2 oz)     SpO2: 98% 96%  97%    Intake/Output Summary (Last 24 hours) at 10/07/14 1517 Last data filed at 10/07/14 1020  Gross per 24 hour  Intake 688.75 ml  Output    875 ml  Net -186.25 ml   Filed Weights   10/07/14 0234  Weight: 85.14 kg (187 lb 11.2 oz)    Exam:  Physical Exam: General: Awake, Oriented, No acute distress. HEENT: EOMI. Neck: Supple CV: S1 and S2 Lungs: Clear to ascultation bilaterally Abdomen: Soft, Nontender, Nondistended, +bowel sounds. Ext: Good pulses. Trace  edema.  Diminished sensation in his right foot.  Had 1+ dorsiflexion.  Had 3-4+ plantar flexion.  Data Reviewed: Basic Metabolic Panel:  Recent Labs Lab 10/06/14 2016 10/06/14 2236  NA 150* 142  K 4.7 4.0  CL 112 102  CO2 26  --   GLUCOSE 102* 85  BUN 15 12  CREATININE 1.00 0.90  CALCIUM 9.6  --    Liver Function Tests: No results for input(s): AST, ALT, ALKPHOS, BILITOT, PROT, ALBUMIN in the last 168 hours. No results for input(s): LIPASE, AMYLASE in the last 168 hours. No results for input(s): AMMONIA in the last 168 hours. CBC:  Recent Labs Lab 10/06/14 2016 10/06/14 2236  WBC 9.6  --   HGB 14.4 13.3  HCT 40.4 39.0  MCV 94.6  --   PLT 230  --    Cardiac Enzymes: No results for input(s): CKTOTAL, CKMB, CKMBINDEX, TROPONINI in the last 168 hours. BNP (last 3 results) No results for input(s): BNP in the last 8760 hours.  ProBNP (last 3 results) No results for input(s): PROBNP in the last 8760 hours.  CBG:  Recent Labs Lab 10/06/14 2019 10/07/14 0754 10/07/14 1157  GLUCAP 92 97 112*    No results found for this or any previous visit (from the past 240 hour(s)).   Studies: Ct Head Wo Contrast  10/06/2014   CLINICAL DATA:  Right leg weakness, onset today.  EXAM: CT HEAD WITHOUT CONTRAST  TECHNIQUE: Contiguous axial images were obtained from the base of the skull  through the vertex without intravenous contrast.  COMPARISON:  None.  FINDINGS: Ventricles and sulci appear symmetrical. No mass effect or midline shift. No abnormal extra-axial fluid collections. Gray-white matter junctions are distinct. Basal cisterns are not effaced. No evidence of acute intracranial hemorrhage. No depressed skull fractures. Visualized paranasal sinuses and mastoid air cells are not opacified.  IMPRESSION: No acute intracranial abnormalities.   Electronically Signed   By: Lucienne Capers M.D.   On: 10/06/2014 21:41   Mri Brain Without Contrast  10/07/2014   CLINICAL DATA:  TIA. Off  balance a few days ago. Posterior headache for the past 24 hours. Right leg weakness and numbness.  EXAM: MRI HEAD WITHOUT CONTRAST  TECHNIQUE: Multiplanar, multiecho pulse sequences of the brain and surrounding structures were obtained without intravenous contrast.  COMPARISON:  Head CT 10/06/2014  FINDINGS: There is no evidence of acute infarct, intracranial hemorrhage, mass, midline shift, or extra-axial fluid collection. There is mild cerebral atrophy. Periventricular white matter T2 hyperintensities are nonspecific but may reflect minimal chronic small vessel ischemic disease, less than is often seen in patients of this age.  Orbits are unremarkable. Mild left maxillary sinus mucosal thickening and trace left mastoid fluid are noted. Major intracranial vascular flow voids are preserved.  IMPRESSION: 1. No acute intracranial abnormality. 2. Mild cerebral atrophy.   Electronically Signed   By: Logan Bores   On: 10/07/2014 09:07    Scheduled Meds: . aspirin  325 mg Oral Daily  . enalapril  10 mg Oral Daily  . pantoprazole  40 mg Oral Daily  . terazosin  8 mg Oral QHS   Continuous Infusions:   Active Problems:   Right hemiplegia   TIA (transient ischemic attack)   Hypertension    Canyon Willow A, MD  [facility] Pager [phone]. If 7PM-7AM, please contact night-coverage at [email or web address], password [facility] 10/07/2014, 3:17 PM  LOS: 1 day

## 2014-10-07 NOTE — Consult Note (Addendum)
Referring Physician: Roel Cluck    Chief Complaint: Right foot weakness  HPI: Ross Becker is an 69 y.o. male with a history of hypertension who reports that he went to bed late on Wednesday morning (about 1AM) and was at baseline.  He therefore slept in and did not wake up until about 1300 on yesterday.  When he got out of bed he noted that his right foot was weak and numb.  It affected his gait. His symptoms did not improve and the patient presented for evaluation.  The patient reports sleeping heavily that night.  Does not report excessive drinking prior to going to sleep. Patient reports that he has a history of dizziness with pivoting that has worsened over the past 1-2 day.  He also reports that he has pain behind his left eye that has been an issue since an injury quite a few years ago.  He will have episodes when he is unable to see to the left (what appears to be a [facility] that he is describing) that last a few minutes and resolve.  Also reports that he will have visual scotomas at times as well.    Date last known well: Date: 10/06/2014 Time last known well: Time: 01:00 tPA Given: No: Not felt to be a stroke  Past Medical History  Diagnosis Date  . GERD (gastroesophageal reflux disease)   . Gout   . Hypertension     Past Surgical History  Procedure Laterality Date  . Cholecystectomy    . Hernia repair      Family History  Problem Relation Age of Onset  . Brain cancer Mother   . Cancer Father    Social History:  reports that he has never smoked. He does not have any smokeless tobacco history on file. He reports that he drinks alcohol. He reports that he does not use illicit drugs.  Allergies:  Allergies  Allergen Reactions  . Penicillins Other (See Comments)    "childhood allergy"    Medications:  I have reviewed the patient's current medications. Prior to Admission:  Prescriptions prior to admission  Medication Sig Dispense Refill Last Dose  . acetaminophen (TYLENOL)  325 MG tablet Take 650 mg by mouth every 6 (six) hours as needed for mild pain.   10/05/2014 at Unknown time  . cholecalciferol (VITAMIN D) 1000 UNITS tablet Take 1,000 Units by mouth daily.   10/05/2014 at Unknown time  . colchicine 0.6 MG tablet Take 0.6 mg by mouth daily as needed. For gout   Past Week at Unknown time  . cyanocobalamin 1000 MCG tablet Take 100 mcg by mouth daily.   10/06/2014 at Unknown time  . enalapril (VASOTEC) 20 MG tablet Take 10 mg by mouth daily.   10/06/2014 at Unknown time  . omeprazole (PRILOSEC) 20 MG capsule Take 40 mg by mouth 2 (two) times daily before a meal.    10/05/2014 at Unknown time  . terazosin (HYTRIN) 2 MG capsule Take 8 mg by mouth at bedtime.   10/05/2014 at Unknown time  . ondansetron (ZOFRAN ODT) 8 MG disintegrating tablet 8mg  ODT q4 hours prn nausea (Patient not taking: Reported on 10/06/2014) 20 tablet 0   . propranolol (INDERAL) 20 MG tablet Take 20 mg by mouth 2 (two) times daily as needed. For migraines   unknown at unknown   Scheduled: . aspirin  325 mg Oral Daily  . LORazepam  1 mg Intravenous Once  . pantoprazole  40 mg Oral Daily  . terazosin  8 mg Oral QHS    ROS: History obtained from the patient  General ROS: negative for - chills, fatigue, fever, night sweats, weight gain or weight loss Psychological ROS: negative for - behavioral disorder, hallucinations, memory difficulties, mood swings or suicidal ideation Ophthalmic ROS: as noted in HPI ENT ROS: as noted in HPI Allergy and Immunology ROS: negative for - hives or itchy/watery eyes Hematological and Lymphatic ROS: negative for - bleeding problems, bruising or swollen lymph nodes Endocrine ROS: negative for - galactorrhea, hair pattern changes, polydipsia/polyuria or temperature intolerance Respiratory ROS: negative for - cough, hemoptysis, shortness of breath or wheezing Cardiovascular ROS: negative for - chest pain, dyspnea on exertion, edema or irregular heartbeat Gastrointestinal  ROS: negative for - abdominal pain, diarrhea, hematemesis, nausea/vomiting or stool incontinence Genito-Urinary ROS: negative for - dysuria, hematuria, incontinence or urinary frequency/urgency Musculoskeletal ROS: negative for - joint swelling or muscular weakness Neurological ROS:  Dermatological ROS: negative for rash and skin lesion changes  Physical Examination: Blood pressure 158/71, pulse 62, temperature 98 F (36.7 C), temperature source Oral, resp. rate 16, height 5' 11.5" (1.816 m), weight 85.14 kg (187 lb 11.2 oz), SpO2 98 %.  HEENT-  Normocephalic, no lesions, without obvious abnormality.  Normal external eye and conjunctiva.  Normal TM's bilaterally.  Normal auditory canals and external ears. Normal external nose, mucus membranes and septum.  Normal pharynx. Cardiovascular- S1, S2 normal, pulses palpable throughout   Lungs- chest clear, no wheezing, rales, normal symmetric air entry Abdomen- soft, non-tender; bowel sounds normal; no masses,  no organomegaly Extremities- no edema Lymph-no adenopathy palpable Musculoskeletal-no joint tenderness, deformity or swelling Skin-warm and dry, no hyperpigmentation, vitiligo, or suspicious lesions  Neurological Examination Mental Status: Alert, oriented, thought content appropriate.  Speech fluent without evidence of aphasia.  Able to follow 3 step commands without difficulty. Cranial Nerves: II: Discs flat bilaterally; Visual fields grossly normal, pupils equal, round, reactive to light and accommodation III,IV, VI: ptosis not present, extra-ocular motions intact bilaterally V,VII: smile symmetric, facial light touch sensation normal bilaterally VIII: hearing normal bilaterally IX,X: gag reflex present XI: bilateral shoulder shrug XII: midline tongue extension Motor: Right : Upper extremity   5/5    Left:     Upper extremity   5/5  Lower extremity   5/5 proximally   Lower extremity   5/5 In the right foot distally with 0/5 plantar  flexion and eversion.  Inversion 5/5.  Quadriceps and hamstrings 5/5 Tone and bulk:normal tone throughout; no atrophy noted Sensory: Pinprick and light touch decreased in the lateral aspect of the foot and in the first web space.   Deep Tendon Reflexes: 2+ and symmetric throughout Plantars: Right: downgoing   Left: downgoing Cerebellar: normal finger-to-nose and normal heel-to-shin testing bilaterally   Laboratory Studies:  Basic Metabolic Panel:  Recent Labs Lab 10/06/14 2016 10/06/14 2236  NA 150* 142  K 4.7 4.0  CL 112 102  CO2 26  --   GLUCOSE 102* 85  BUN 15 12  CREATININE 1.00 0.90  CALCIUM 9.6  --     Liver Function Tests: No results for input(s): AST, ALT, ALKPHOS, BILITOT, PROT, ALBUMIN in the last 168 hours. No results for input(s): LIPASE, AMYLASE in the last 168 hours. No results for input(s): AMMONIA in the last 168 hours.  CBC:  Recent Labs Lab 10/06/14 2016 10/06/14 2236  WBC 9.6  --   HGB 14.4 13.3  HCT 40.4 39.0  MCV 94.6  --   PLT 230  --  Cardiac Enzymes: No results for input(s): CKTOTAL, CKMB, CKMBINDEX, TROPONINI in the last 168 hours.  BNP: Invalid input(s): POCBNP  CBG:  Recent Labs Lab 10/06/14 2019  GLUCAP 92    Microbiology: No results found for this or any previous visit.  Coagulation Studies: No results for input(s): LABPROT, INR in the last 72 hours.  Urinalysis:  Recent Labs Lab 10/07/14 0007  COLORURINE YELLOW  LABSPEC 1.012  PHURINE 5.5  GLUCOSEU NEGATIVE  HGBUR NEGATIVE  BILIRUBINUR NEGATIVE  KETONESUR NEGATIVE  PROTEINUR NEGATIVE  UROBILINOGEN 0.2  NITRITE NEGATIVE  LEUKOCYTESUR NEGATIVE    Lipid Panel: No results found for: CHOL, TRIG, HDL, CHOLHDL, VLDL, LDLCALC  HgbA1C: No results found for: HGBA1C  Urine Drug Screen:  No results found for: LABOPIA, COCAINSCRNUR, LABBENZ, AMPHETMU, THCU, LABBARB  Alcohol Level: No results for input(s): ETH in the last 168 hours.  Other results: EKG:  sinus tachycardia at 118 bpm.  Imaging: Ct Head Wo Contrast  10/06/2014   CLINICAL DATA:  Right leg weakness, onset today.  EXAM: CT HEAD WITHOUT CONTRAST  TECHNIQUE: Contiguous axial images were obtained from the base of the skull through the vertex without intravenous contrast.  COMPARISON:  None.  FINDINGS: Ventricles and sulci appear symmetrical. No mass effect or midline shift. No abnormal extra-axial fluid collections. Gray-white matter junctions are distinct. Basal cisterns are not effaced. No evidence of acute intracranial hemorrhage. No depressed skull fractures. Visualized paranasal sinuses and mastoid air cells are not opacified.  IMPRESSION: No acute intracranial abnormalities.   Electronically Signed   By: Lucienne Capers M.D.   On: 10/06/2014 21:41    Assessment: 69 y.o. male presenting with a right foot drop.  The remainder of his symptoms appear more chronic.  Patient was to have MRI evaluation of those symptoms when living in [place] but moved before this was completed.  At this time neurological examination suggests a more peripheral cause and a likely peroneal neuropathy.  It is unclear if there may have been a compression injury while sleeping.  Head CT personally reviewed and shows no acute changes.    Stroke Risk Factors - hypertension  Plan: 1. HgbA1c, fasting lipid panel 2. MRI of the brain without contrast to complete work up of more chronic symptoms 3. PT consult for right AFO 4. NCV/EMG as an outpatient 5.  Blood sugars normal.  Would check TSH.    Alexis Goodell, MD [facility] [phone] 10/07/2014, 5:45 AM

## 2014-10-07 NOTE — Evaluation (Signed)
Physical Therapy Evaluation Patient Details Name: Min Collymore MRN: [id number] DOB: 1946/02/21 Today's Date: 10/07/2014   History of Present Illness  Aneesh Faller is an 69 y.o. male with a history of hypertension who reports that he went to bed late on Wednesday morning (about 1AM) and was at baseline. He therefore slept in and did not wake up until about 1300 on yesterday. When he got out of bed he noted that his right foot was weak and numb. It affected his gait. His symptoms did not improve and the patient presented for evaluation.  Clinical Impression  Pt admitted with/for R foot drop.  MRI negative for infarct.  Pt currently limited functionally due to the problems listed below.  (see problems list.)  Pt will benefit from PT to maximize function and safety to be able to get home safely alone.     Follow Up Recommendations No PT follow up    Equipment Recommendations  Cane;Other (comment) (shower seat)    Recommendations for Other Services       Precautions / Restrictions Precautions Precautions: Fall (minimal risk)      Mobility  Bed Mobility Overal bed mobility: Independent                Transfers Overall transfer level: Independent                  Ambulation/Gait Ambulation/Gait assistance: Supervision Ambulation Distance (Feet): 150 Feet Assistive device:  (occasional use of the rail.) Gait Pattern/deviations: Step-through pattern;Steppage Gait velocity: moderate   General Gait Details: Mild steppage on the right fatiguing to moderate steppage due to very weak df  Stairs            Wheelchair Mobility    Modified Rankin (Stroke Patients Only)       Balance Overall balance assessment: Needs assistance Sitting-balance support: No upper extremity supported Sitting balance-Leahy Scale: Normal     Standing balance support: No upper extremity supported Standing balance-Leahy Scale: Fair                                Pertinent Vitals/Pain Pain Assessment: No/denies pain    Home Living Family/patient expects to be discharged to:: Private residence Living Arrangements: Alone   Type of Home: Apartment Home Access: Stairs to enter Entrance Stairs-Rails: Psychiatric nurse of Steps: 3 Home Layout: One level Home Equipment: None      Prior Function Level of Independence: Independent               Hand Dominance        Extremity/Trunk Assessment   Upper Extremity Assessment: Defer to OT evaluation           Lower Extremity Assessment: Overall WFL for tasks assessed;RLE deficits/detail RLE Deficits / Details: trace df, 2/5 eversion and 4/5 pf       Communication   Communication: No difficulties  Cognition Arousal/Alertness: Awake/alert Behavior During Therapy: WFL for tasks assessed/performed Overall Cognitive Status: Within Functional Limits for tasks assessed                      General Comments General comments (skin integrity, edema, etc.): Discussed wait and see concerning need for AFO until neuro followup due to potential improvements.  Discussed other safety issues such as not safe to drive, environmental hazards in the home.    Exercises        Assessment/Plan  PT Assessment Patient needs continued PT services  PT Diagnosis Abnormality of gait   PT Problem List Decreased strength;Decreased balance;Decreased knowledge of precautions;Decreased knowledge of use of DME  PT Treatment Interventions DME instruction;Gait training;Stair training;Balance training;Patient/family education   PT Goals (Current goals can be found in the Care Plan section) Acute Rehab PT Goals Patient Stated Goal: need to be independent PT Goal Formulation: With patient Time For Goal Achievement: 10/09/14 Potential to Achieve Goals: Good    Frequency Min 2X/week   Barriers to discharge Decreased caregiver support      Co-evaluation                End of Session   Activity Tolerance: Patient tolerated treatment well Patient left: in bed;with call bell/phone within reach Nurse Communication: Mobility status         Time: 1610-9604 PT Time Calculation (min) (ACUTE ONLY): 48 min   Charges:   PT Evaluation $Initial PT Evaluation Tier I: 1 Procedure PT Treatments $Gait Training: 8-22 mins $Therapeutic Activity: 8-22 mins   PT G Codes:        Vardaan Depascale, Tessie Fass 10/07/2014, 2:03 PM 10/07/2014  Donnella Sham, PT [phone]  (pager)

## 2014-10-08 LAB — GLUCOSE, CAPILLARY
Glucose-Capillary: 97 mg/dL (ref 70–99)
Glucose-Capillary: 101 mg/dL — ABNORMAL HIGH (ref 70–99)

## 2014-10-08 LAB — BASIC METABOLIC PANEL
ANION GAP: 6 (ref 5–15)
BUN: 9 mg/dL (ref 6–23)
CO2: 26 mmol/L (ref 19–32)
Calcium: 8.3 mg/dL — ABNORMAL LOW (ref 8.4–10.5)
Chloride: 107 mmol/L (ref 96–112)
Creatinine, Ser: 0.9 mg/dL (ref 0.50–1.35)
GFR calc Af Amer: >90 mL/min (ref >90)
GFR calc non Af Amer: 86 mL/min — ABNORMAL LOW (ref >90)
Glucose, Bld: 94 mg/dL (ref 70–99)
Potassium: 3.9 mmol/L (ref 3.5–5.1)
SODIUM: 139 mmol/L (ref 135–145)

## 2014-10-08 LAB — CBC
HCT: 35.5 % — ABNORMAL LOW (ref 39.0–52.0)
Hemoglobin: 12.4 g/dL — ABNORMAL LOW (ref 13.0–17.0)
MCH: 33.2 pg (ref 26.0–34.0)
MCHC: 34.9 g/dL (ref 30.0–36.0)
MCV: 95.2 fL (ref 78.0–100.0)
Platelets: 162 10*3/uL (ref 150–400)
RBC: 3.73 MIL/uL — AB (ref 4.22–5.81)
RDW: 11.8 % (ref 11.5–15.5)
WBC: 5.4 10*3/uL (ref 4.0–10.5)

## 2014-10-08 LAB — HEMOGLOBIN A1C
Hgb A1c MFr Bld: 5.2 % (ref 4.8–5.6)
Mean Plasma Glucose: 103 mg/dL

## 2014-10-08 LAB — T4, FREE: Free T4: 1.13 ng/dL (ref 0.80–1.80)

## 2014-10-08 MED ORDER — ASPIRIN 81 MG PO TABS: 81.0000 mg | ORAL_TABLET | Freq: Every day | ORAL | Status: DC

## 2014-10-08 NOTE — Care Management Note (Signed)
    Page 1 of 1   10/08/2014     5:33:13 PM CARE MANAGEMENT NOTE 10/08/2014  Patient:  Ross Becker, Ross Becker   Account Number:  [id number]  Date Initiated:  10/08/2014  Documentation initiated by:  Tomi Bamberger  Subjective/Objective Assessment:   dx tia  admit= lives alone     Action/Plan:   pt eval- no f/u   Anticipated DC Date:  10/08/2014   Anticipated DC Plan:  [place]  CM consult      Choice offered to / List presented to:             Status of service:  Completed, signed off Medicare Important Message given?  NA - LOS <3 / Initial given by admissions (If response is "NO", the following Medicare IM given date fields will be blank) Date Medicare IM given:   Medicare IM given by:   Date Additional Medicare IM given:   Additional Medicare IM given by:    Discharge Disposition:  HOME/SELF CARE  Per UR Regulation:  Reviewed for med. necessity/level of care/duration of stay  If discussed at [place] of Stay Meetings, dates discussed:    Comments:  10/08/14 [place], BSN [phone] no needs anticiapated.

## 2014-10-08 NOTE — Progress Notes (Signed)
Notified Dr Bjorn Loser and echo and carotids dopplers where cancelled by neurology. If test are still needed sh will have to reorder

## 2014-10-08 NOTE — Discharge Summary (Signed)
Physician Discharge Summary  Ross Becker NUU:[id number] DOB: Feb 21, 1946 DOA: 10/06/2014  PCP: Historical Provider, MD  Admit date: 10/06/2014 Discharge date: 10/08/2014  Recommendations for Outpatient Follow-up:  1. Pt will need to follow up with PCP in 2-3 weeks post discharge 2. Please obtain BMP to evaluate electrolytes and kidney function 3. Please also check CBC to evaluate Hg and Hct levels 4. Pt advised to follow up with neurologist if needed   Discharge Diagnoses:  Active Problems:   Right hemiplegia   TIA (transient ischemic attack)   Hypertension   Weakness of right foot  Discharge Condition: Stable  Diet recommendation: Heart healthy diet discussed in details   History of present illness:   69 y.o. male with GERD, HTN, gout, presented unsteady gait and headaches 24 hours in duration. He has denies any specific neurologist symptoms other than the right foot weakness.    Right foot weakness MRI of the brain without contrast negative for any intracranial abnormality. Patient on aspirin. Neurology recommended outpatient EMG and outpatient neurology follow-up ([facility] is [facility] may need to followup with [place] neurology). 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 Physical therapy recommended HH PT.   Hypertension Restart patient's home antihypertensive medications. Stable.  Abnormal TSH Free T4 pending   Code Status: Full code. Family Communication: Patient updated at bedside.  Disposition Plan: Home today   Consultants:  Neurology  Procedures:  MRI Brain  Head CT  Antibiotics:  None.   Procedures/Studies: Ct Head Wo Contrast  10/06/2014   No acute intracranial abnormalities.     Mri Brain Without Contrast  10/07/2014 No acute intracranial abnormality. 2. Mild cerebral atrophy.   Consultations:  None   Antibiotics:  None   Discharge Exam: Filed Vitals:   10/08/14 0418  BP: 139/77  Pulse: 60  Temp: 97.8 F (36.6 C)   Resp: 13   Filed Vitals:   10/07/14 1353 10/07/14 1735 10/07/14 2316 10/08/14 0418  BP: 137/78 127/65 126/72 139/77  Pulse:  53 57 60  Temp: 98.1 F (36.7 C) 97.9 F (36.6 C) 97.7 F (36.5 C) 97.8 F (36.6 C)  TempSrc: Oral Oral Oral Oral  Resp:  16 17 13   Height:      Weight:      SpO2: 97% 98% 95% 96%    General: Pt is alert, follows commands appropriately, not in acute distress Cardiovascular: Regular rate and rhythm, S1/S2 +, no murmurs, no rubs, no gallops Respiratory: Clear to auscultation bilaterally, no wheezing, no crackles, no rhonchi Abdominal: Soft, non tender, non distended, bowel sounds +, no guarding Extremities: no edema, no cyanosis, pulses palpable bilaterally DP and PT Neuro: Grossly nonfocal  Discharge Instructions  Discharge Instructions    Diet - low sodium heart healthy    Complete by:  As directed      Increase activity slowly    Complete by:  As directed             Medication List    STOP taking these medications        ondansetron 8 MG disintegrating tablet  Commonly known as:  ZOFRAN ODT      TAKE these medications        acetaminophen 325 MG tablet  Commonly known as:  TYLENOL  Take 650 mg by mouth every 6 (six) hours as needed for mild pain.     aspirin 81 MG tablet  Take 1 tablet (81 mg total) by mouth daily.     cholecalciferol  1000 UNITS tablet  Commonly known as:  VITAMIN D  Take 1,000 Units by mouth daily.     colchicine 0.6 MG tablet  Take 0.6 mg by mouth daily as needed. For gout     cyanocobalamin 1000 MCG tablet  Take 100 mcg by mouth daily.     enalapril 20 MG tablet  Commonly known as:  VASOTEC  Take 10 mg by mouth daily.     omeprazole 20 MG capsule  Commonly known as:  PRILOSEC  Take 40 mg by mouth 2 (two) times daily before a meal.     propranolol 20 MG tablet  Commonly known as:  INDERAL  Take 20 mg by mouth 2 (two) times daily as needed. For migraines     terazosin 2 MG capsule  Commonly known  as:  HYTRIN  Take 8 mg by mouth at bedtime.           Follow-up Information    Follow up with Faye Ramsay, MD.   Specialty:  Internal Medicine   Why:  As needed, If symptoms worsen   Contact information:   [address] [place] [address] [phone]        The results of significant diagnostics from this hospitalization (including imaging, microbiology, ancillary and laboratory) are listed below for reference.     Microbiology: No results found for this or any previous visit (from the past 240 hour(s)).   Labs: Basic Metabolic Panel:  Recent Labs Lab 10/06/14 2016 10/06/14 2236 10/08/14 0611  NA 150* 142 139  K 4.7 4.0 3.9  CL 112 102 107  CO2 26  --  26  GLUCOSE 102* 85 94  BUN 15 12 9   CREATININE 1.00 0.90 0.90  CALCIUM 9.6  --  8.3*   CBC:  Recent Labs Lab 10/06/14 2016 10/06/14 2236 10/08/14 0611  WBC 9.6  --  5.4  HGB 14.4 13.3 12.4*  HCT 40.4 39.0 35.5*  MCV 94.6  --  95.2  PLT 230  --  162   CBG:  Recent Labs Lab 10/07/14 0754 10/07/14 1157 10/07/14 1701 10/07/14 2331 10/08/14 0813  GLUCAP 97 112* 100* 127* 97     SIGNED: Time coordinating discharge: Over 30 minutes  Faye Ramsay, MD  [facility] 10/08/2014, 10:02 AM Pager [phone]  If 7PM-7AM, please contact night-coverage [email or web address] Password [email or web address]

## 2014-10-08 NOTE — Discharge Instructions (Signed)
Aspirin and Your Heart Aspirin affects the way your blood clots and helps "thin" the blood. Aspirin has many uses in heart disease. It may be used as a primary prevention to help reduce the risk of heart related events. It also can be used as a secondary measure to prevent more heart attacks or to prevent additional damage from blood clots.  ASPIRIN MAY HELP IF YOU:  Have had a heart attack or chest pain.  Have undergone open heart surgery such as CABG (Coronary Artery Bypass Surgery).  Have had coronary angioplasty with or without stents.  Have experienced a stroke or TIA (transient ischemic attack).  Have peripheral vascular disease (PAD).  Have chronic heart rhythm problems such as atrial fibrillation.  Are at risk for heart disease. BEFORE STARTING ASPIRIN Before you start taking aspirin, your caregiver will need to review your medical history. Many things will need to be taken into consideration, such as:  Smoking status.  Blood pressure.  Diabetes.  Gender.  Weight.  Cholesterol level. ASPIRIN DOSES  Aspirin should only be taken on the advice of your caregiver. Talk to your caregiver about how much aspirin you should take. Aspirin comes in different doses such as:  81 mg.  162 mg.  325 mg.  The aspirin dose you take may be affected by many factors, some of which include:  Your current medications, especially if your are taking blood-thinners or anti-platelet medicine.  Liver function.  Heart disease risk.  Age.  Aspirin comes in two forms:  Non-enteric-coated. This type of aspirin does not have a coating and is absorbed faster. Non-enteric coated aspirin is recommended for patients experiencing chest pain symptoms. This type of aspirin also comes in a chewable form.  Enteric-coated. This means the aspirin has a special coating that releases the medicine very slowly. Enteric-coated aspirin causes less stomach upset. This type of aspirin should not be chewed  or crushed. ASPIRIN SIDE EFFECTS Daily use of aspirin can increase your risk of serious side effects. Some of these include:  Increased bleeding. This can range from a cut that does not stop bleeding to more serious problems such as stomach bleeding or bleeding into the brain (Intracerebral bleeding).  Increased bruising.  Stomach upset.  An allergic reaction such as red, itchy skin.  Increased risk of bleeding when combined with non-steroidal anti-inflammatory medicine (NSAIDS).  Alcohol should be drank in moderation when taking aspirin. Alcohol can increase the risk of stomach bleeding when taken with aspirin.  Aspirin should not be given to children less than 68 years of age due to the association of Reye syndrome. Reye syndrome is a serious illness that can affect the brain and liver. Studies have linked Reye syndrome with aspirin use in children.  People that have nasal polyps have an increased risk of developing an aspirin allergy. SEEK MEDICAL CARE IF:   You develop an allergic reaction such as:  Hives.  Itchy skin.  Swelling of the lips, tongue or face.  You develop stomach pain.  You have unusual bleeding or bruising.  You have ringing in your ears. SEEK IMMEDIATE MEDICAL CARE IF:   You have severe chest pain, especially if the pain is crushing or pressure-like and spreads to the arms, back, neck, or jaw. THIS IS AN EMERGENCY. Do not wait to see if the pain will go away. Get medical help at once. Call your local emergency services (911 in the [place] DO NOT drive yourself to the hospital.  You have stroke-like symptoms  such as:  Loss of vision.  Difficulty talking.  Numbness or weakness on one side of your body.  Numbness or weakness in your arm or leg.  Not thinking clearly or feeling confused.  Your bowel movements are bloody, dark red or black in color.  You vomit or cough up blood.  You have blood in your urine.  You have shortness of breath,  coughing or wheezing. MAKE SURE YOU:   Understand these instructions.  Will monitor your condition.  Seek immediate medical care if necessary. Document Released: 07/26/2008 Document Revised: 12/08/2012 Document Reviewed: 11/18/2013 [facility] Patient Information 2015 [place], [place]. This information is not intended to replace advice given to you by your health care provider. Make sure you discuss any questions you have with your health care provider.

## 2014-10-08 NOTE — Progress Notes (Signed)
Patient discharge instruction reviewed with patient. Patient verbalized understanding. Patient instructed  To take a daily 81 mg and verbalized signs and symptom of a stroke using teach back. Patient skin intact IV removed. Patient escorted to d/c lounge.

## 2014-10-08 NOTE — Progress Notes (Signed)
Physical Therapy Treatment Patient Details Name: Ross Becker MRN: [id number] DOB: [birth year] Today's Date: 10/08/2014    History of Present Illness Ross Becker is an 69 y.o. male with a history of hypertension who reports that he went to bed late on Wednesday morning (about 1AM) and was at baseline. He therefore slept in and did not wake up until about 1300 on yesterday. When he got out of bed he noted that his right foot was weak and numb. It affected his gait. His symptoms did not improve and the patient presented for evaluation.    PT Comments    Pt is ready for discharge.  R LE dorsiflexion shows improvement, hopeful for return.  Pt used cane well, but feels safe enough without cane and chooses to d/c without assistive device at this time.  Will sign off at this time.   Follow Up Recommendations  No PT follow up     Equipment Recommendations  None recommended by PT    Recommendations for Other Services       Precautions / Restrictions      Mobility  Bed Mobility Overal bed mobility: Independent                Transfers Overall transfer level: Independent                  Ambulation/Gait Ambulation/Gait assistance: Independent;Modified independent (Device/Increase time) Ambulation Distance (Feet): 280 Feet Assistive device: None;Straight cane Gait Pattern/deviations: Step-through pattern Gait velocity: functional speed Gait velocity interpretation: at or above normal speed for age/gender General Gait Details: Steady with good foot clearance initially and the more mild steppage as tibialis anterior fatigued.   Stairs Stairs: Yes Stairs assistance: Modified independent (Device/Increase time) Stair Management: One rail Right;Step to pattern;Forwards Number of Stairs: 10 General stair comments: steady with rail  Wheelchair Mobility    Modified Rankin (Stroke Patients Only)       Balance Overall balance assessment: No apparent  balance deficits (not formally assessed)   Sitting balance-Leahy Scale: Normal       Standing balance-Leahy Scale: Good                      Cognition Arousal/Alertness: Awake/alert Behavior During Therapy: WFL for tasks assessed/performed Overall Cognitive Status: Within Functional Limits for tasks assessed                      Exercises      General Comments        Pertinent Vitals/Pain Pain Assessment: No/denies pain    Home Living                      Prior Function            PT Goals (current goals can now be found in the care plan section) Acute Rehab PT Goals Patient Stated Goal: need to be independent PT Goal Formulation: With patient Time For Goal Achievement: 10/09/14 Potential to Achieve Goals: Good Progress towards PT goals: Progressing toward goals    Frequency       PT Plan Current plan remains appropriate    Co-evaluation             End of Session   Activity Tolerance: Patient tolerated treatment well Patient left: in bed;with call bell/phone within reach     Time: 1125-1140 PT Time Calculation (min) (ACUTE ONLY): 15 min  Charges:  $Gait Training: 8-22 mins  G Codes:      Abdallah Hern, Tessie Fass 10/08/2014, 11:51 AM 10/08/2014  Donnella Sham, [place] [phone]  (pager)

## 2016-01-07 ENCOUNTER — Encounter (HOSPITAL_COMMUNITY): Payer: Self-pay | Admitting: Emergency Medicine

## 2016-01-07 ENCOUNTER — Emergency Department (HOSPITAL_COMMUNITY)
Admission: EM | Admit: 2016-01-07 | Discharge: 2016-01-07 | Disposition: A | Discharge status: Home / Self Care | Payer: Medicare Other | Attending: Emergency Medicine | Admitting: Emergency Medicine

## 2016-01-07 ENCOUNTER — Emergency Department (HOSPITAL_COMMUNITY): Payer: Medicare Other

## 2016-01-07 DIAGNOSIS — R079 Chest pain, unspecified: Secondary | ICD-10-CM | POA: Insufficient documentation

## 2016-01-07 DIAGNOSIS — M109 Gout, unspecified: Secondary | ICD-10-CM | POA: Diagnosis not present

## 2016-01-07 DIAGNOSIS — I10 Essential (primary) hypertension: Secondary | ICD-10-CM | POA: Insufficient documentation

## 2016-01-07 DIAGNOSIS — Z79899 Other long term (current) drug therapy: Secondary | ICD-10-CM | POA: Insufficient documentation

## 2016-01-07 DIAGNOSIS — Z880 Allergy status to penicillin: Secondary | ICD-10-CM | POA: Insufficient documentation

## 2016-01-07 DIAGNOSIS — Z7982 Long term (current) use of aspirin: Secondary | ICD-10-CM | POA: Diagnosis not present

## 2016-01-07 DIAGNOSIS — K219 Gastro-esophageal reflux disease without esophagitis: Secondary | ICD-10-CM | POA: Diagnosis not present

## 2016-01-07 DIAGNOSIS — R0789 Other chest pain: Secondary | ICD-10-CM | POA: Diagnosis not present

## 2016-01-07 LAB — CBC
HCT: 40.7 % (ref 39.0–52.0)
Hemoglobin: 14.3 g/dL (ref 13.0–17.0)
MCH: 33.1 pg (ref 26.0–34.0)
MCHC: 35.1 g/dL (ref 30.0–36.0)
MCV: 94.2 fL (ref 78.0–100.0)
PLATELETS: 177 10*3/uL (ref 150–400)
RBC: 4.32 MIL/uL (ref 4.22–5.81)
RDW: 11.9 % (ref 11.5–15.5)
WBC: 8.3 10*3/uL (ref 4.0–10.5)

## 2016-01-07 LAB — BASIC METABOLIC PANEL
Anion gap: 10 (ref 5–15)
BUN: 16 mg/dL (ref 6–20)
CO2: 23 mmol/L (ref 22–32)
CREATININE: 1.02 mg/dL (ref 0.61–1.24)
Calcium: 9.2 mg/dL (ref 8.9–10.3)
Chloride: 105 mmol/L (ref 101–111)
GFR calc non Af Amer: >60 mL/min (ref >60)
Glucose, Bld: 104 mg/dL — ABNORMAL HIGH (ref 65–99)
POTASSIUM: 4.1 mmol/L (ref 3.5–5.1)
Sodium: 138 mmol/L (ref 135–145)

## 2016-01-07 LAB — I-STAT TROPONIN, ED
Troponin i, poc: 0 ng/mL (ref 0.00–0.08)
Troponin i, poc: 0 ng/mL (ref 0.00–0.08)

## 2016-01-07 NOTE — Discharge Instructions (Signed)
Follow up with your cardiologist.  Take zantac 150mg  twice a day for the pain.  Nonspecific Chest Pain  Chest pain can be caused by many different conditions. There is always a chance that your pain could be related to something serious, such as a heart attack or a blood clot in your lungs. Chest pain can also be caused by conditions that are not life-threatening. If you have chest pain, it is very important to follow up with your health care provider. CAUSES  Chest pain can be caused by:  Heartburn.  Pneumonia or bronchitis.  Anxiety or stress.  Inflammation around your heart (pericarditis) or lung (pleuritis or pleurisy).  A blood clot in your lung.  A collapsed lung (pneumothorax). It can develop suddenly on its own (spontaneous pneumothorax) or from trauma to the chest.  Shingles infection (varicella-zoster virus).  Heart attack.  Damage to the bones, muscles, and cartilage that make up your chest wall. This can include:  Bruised bones due to injury.  Strained muscles or cartilage due to frequent or repeated coughing or overwork.  Fracture to one or more ribs.  Sore cartilage due to inflammation (costochondritis). RISK FACTORS  Risk factors for chest pain may include:  Activities that increase your risk for trauma or injury to your chest.  Respiratory infections or conditions that cause frequent coughing.  Medical conditions or overeating that can cause heartburn.  Heart disease or family history of heart disease.  Conditions or health behaviors that increase your risk of developing a blood clot.  Having had chicken pox (varicella zoster). SIGNS AND SYMPTOMS Chest pain can feel like:  Burning or tingling on the surface of your chest or deep in your chest.  Crushing, pressure, aching, or squeezing pain.  Dull or sharp pain that is worse when you move, cough, or take a deep breath.  Pain that is also felt in your back, neck, shoulder, or arm, or pain that  spreads to any of these areas. Your chest pain may come and go, or it may stay constant. DIAGNOSIS Lab tests or other studies may be needed to find the cause of your pain. Your health care provider may have you take a test called an ambulatory ECG (electrocardiogram). An ECG records your heartbeat patterns at the time the test is performed. You may also have other tests, such as:  Transthoracic echocardiogram (TTE). During echocardiography, sound waves are used to create a picture of all of the heart structures and to look at how blood flows through your heart.  Transesophageal echocardiogram (TEE).This is a more advanced imaging test that obtains images from inside your body. It allows your health care provider to see your heart in finer detail.  Cardiac monitoring. This allows your health care provider to monitor your heart rate and rhythm in real time.  Holter monitor. This is a portable device that records your heartbeat and can help to diagnose abnormal heartbeats. It allows your health care provider to track your heart activity for several days, if needed.  Stress tests. These can be done through exercise or by taking medicine that makes your heart beat more quickly.  Blood tests.  Imaging tests. TREATMENT  Your treatment depends on what is causing your chest pain. Treatment may include:  Medicines. These may include:  Acid blockers for heartburn.  Anti-inflammatory medicine.  Pain medicine for inflammatory conditions.  Antibiotic medicine, if an infection is present.  Medicines to dissolve blood clots.  Medicines to treat coronary artery disease.  Supportive  care for conditions that do not require medicines. This may include:  Resting.  Applying heat or cold packs to injured areas.  Limiting activities until pain decreases. HOME CARE INSTRUCTIONS  If you were prescribed an antibiotic medicine, finish it all even if you start to feel better.  Avoid any activities  that bring on chest pain.  Do not use any tobacco products, including cigarettes, chewing tobacco, or electronic cigarettes. If you need help quitting, ask your health care provider.  Do not drink alcohol.  Take medicines only as directed by your health care provider.  Keep all follow-up visits as directed by your health care provider. This is important. This includes any further testing if your chest pain does not go away.  If heartburn is the cause for your chest pain, you may be told to keep your head raised (elevated) while sleeping. This reduces the chance that acid will go from your stomach into your esophagus.  Make lifestyle changes as directed by your health care provider. These may include:  Getting regular exercise. Ask your health care provider to suggest some activities that are safe for you.  Eating a heart-healthy diet. A registered dietitian can help you to learn healthy eating options.  Maintaining a healthy weight.  Managing diabetes, if necessary.  Reducing stress. SEEK MEDICAL CARE IF:  Your chest pain does not go away after treatment.  You have a rash with blisters on your chest.  You have a fever. SEEK IMMEDIATE MEDICAL CARE IF:   Your chest pain is worse.  You have an increasing cough, or you cough up blood.  You have severe abdominal pain.  You have severe weakness.  You faint.  You have chills.  You have sudden, unexplained chest discomfort.  You have sudden, unexplained discomfort in your arms, back, neck, or jaw.  You have shortness of breath at any time.  You suddenly start to sweat, or your skin gets clammy.  You feel nauseous or you vomit.  You suddenly feel light-headed or dizzy.  Your heart begins to beat quickly, or it feels like it is skipping beats. These symptoms may represent a serious problem that is an emergency. Do not wait to see if the symptoms will go away. Get medical help right away. Call your local emergency  services (911 in the [place] Do not drive yourself to the hospital.   This information is not intended to replace advice given to you by your health care provider. Make sure you discuss any questions you have with your health care provider.   Document Released: 05/23/2005 Document Revised: 09/03/2014 Document Reviewed: 03/19/2014 Elsevier Interactive Patient Education [employer].

## 2016-01-07 NOTE — ED Notes (Signed)
Patient transported to X-ray 

## 2016-01-07 NOTE — ED Provider Notes (Signed)
CSN: [id number]     Arrival date & time 01/07/16  1849 History   First MD Initiated Contact with Patient 01/07/16 1853     Chief Complaint  Patient presents with  . Chest Pain     (Consider location/radiation/quality/duration/timing/severity/associated sxs/prior Treatment) Patient is a 70 y.o. male presenting with chest pain. The history is provided by the patient.  Chest Pain Pain location:  L chest Pain quality: sharp and shooting   Pain radiates to:  Does not radiate Pain radiates to the back: no   Pain severity:  Mild Onset quality:  Sudden Duration:  2 hours Timing:  Constant Progression:  Unchanged Chronicity:  New Relieved by:  Nothing Worsened by:  Nothing tried Ineffective treatments:  None tried Associated symptoms: no abdominal pain, no fever, no headache, no palpitations, no shortness of breath and not vomiting     70 yo M with chest pain.  Left sided, non exertional.  Having some indigestion for past week.  Denies sob, diaphoresis.  HTN.  Non smoker.    Past Medical History  Diagnosis Date  . GERD (gastroesophageal reflux disease)   . Gout   . Hypertension    Past Surgical History  Procedure Laterality Date  . Cholecystectomy    . Hernia repair     Family History  Problem Relation Age of Onset  . Brain cancer Mother   . Cancer Father    Social History  Substance Use Topics  . Smoking status: Never Smoker   . Smokeless tobacco: None  . Alcohol Use: Yes     Comment: occasionally    Review of Systems  Constitutional: Negative for fever and chills.  HENT: Negative for congestion and facial swelling.   Eyes: Negative for discharge and visual disturbance.  Respiratory: Negative for shortness of breath.   Cardiovascular: Negative for chest pain and palpitations.  Gastrointestinal: Negative for vomiting, abdominal pain and diarrhea.  Musculoskeletal: Negative for myalgias and arthralgias.  Skin: Negative for color change and rash.  Neurological:  Negative for tremors, syncope and headaches.  Psychiatric/Behavioral: Negative for confusion and dysphoric mood.      Allergies  Penicillins  Home Medications   Prior to Admission medications   Medication Sig Start Date End Date Taking? Authorizing Provider  acetaminophen (TYLENOL) 325 MG tablet Take 650 mg by mouth every 6 (six) hours as needed for mild pain.   Yes Historical Provider, MD  cholecalciferol (VITAMIN D) 1000 UNITS tablet Take 2,000 Units by mouth daily.    Yes Historical Provider, MD  Cholecalciferol (VITAMIN D3) 50000 units CAPS Take 1 capsule by mouth every 28 (twenty-eight) days.   Yes Historical Provider, MD  colchicine 0.6 MG tablet Take 0.6 mg by mouth daily as needed. For gout   Yes Historical Provider, MD  cyanocobalamin 1000 MCG tablet Take 1,000 mcg by mouth daily.    Yes Historical Provider, MD  enalapril (VASOTEC) 20 MG tablet Take 10 mg by mouth daily.   Yes Historical Provider, MD  levothyroxine (SYNTHROID, LEVOTHROID) 50 MCG tablet Take 50 mcg by mouth daily before breakfast.   Yes Historical Provider, MD  methocarbamol (ROBAXIN) 750 MG tablet Take 750 mg by mouth at bedtime as needed for muscle spasms.    Yes Historical Provider, MD  omeprazole (PRILOSEC) 20 MG capsule Take 20 mg by mouth daily.    Yes Historical Provider, MD  terazosin (HYTRIN) 2 MG capsule Take 8 mg by mouth at bedtime.   Yes Historical Provider, MD  aspirin 81  MG tablet Take 1 tablet (81 mg total) by mouth daily. 10/08/14   Theodis Blaze, MD   BP 113/82 mmHg  Pulse 87  Temp(Src) 98.3 F (36.8 C) (Oral)  Resp 17  Ht 6' (1.829 m)  Wt 187 lb (84.823 kg)  BMI 25.36 kg/m2  SpO2 95% Physical Exam  Constitutional: He is oriented to person, place, and time. He appears well-developed and well-nourished.  HENT:  Head: Normocephalic and atraumatic.  Eyes: EOM are normal. Pupils are equal, round, and reactive to light.  Neck: Normal range of motion. Neck supple. No JVD present.   Cardiovascular: Normal rate and regular rhythm.  Exam reveals no gallop and no friction rub.   No murmur heard. Pulmonary/Chest: No respiratory distress. He has no wheezes. He exhibits tenderness.  Abdominal: He exhibits no distension. There is no rebound and no guarding.  Musculoskeletal: Normal range of motion.  Neurological: He is alert and oriented to person, place, and time.  Skin: No rash noted. No pallor.  Psychiatric: He has a normal mood and affect. His behavior is normal.  Nursing note and vitals reviewed.   ED Course  Procedures (including critical care time) Labs Review Labs Reviewed  BASIC METABOLIC PANEL - Abnormal; Notable for the following:    Glucose, Bld 104 (*)    All other components within normal limits  CBC  I-STAT TROPOININ, ED  Randolm Idol, ED    Imaging Review Dg Chest 2 View  01/07/2016  CLINICAL DATA:  Chest pain EXAM: CHEST  2 VIEW COMPARISON:  None. FINDINGS: Elevated right hemidiaphragm. Negative for heart failure. Heart size within normal limits. Negative for pneumonia.  Lungs are clear without infiltrate or mass. IMPRESSION: No active cardiopulmonary disease. Electronically Signed   By: Franchot Gallo M.D.   On: 01/07/2016 19:16   I have personally reviewed and evaluated these images and lab results as part of my medical decision-making.   EKG Interpretation   Date/Time:  Saturday Jan 07 2016 18:54:16 EDT Ventricular Rate:  114 PR Interval:  165 QRS Duration: 93 QT Interval:  316 QTC Calculation: 435 R Axis:   -8 Text Interpretation:  Sinus tachycardia Low voltage, precordial leads  Since last tracing rate faster Confirmed by Dewaine Morocho MD, Quillian Quince [id number]) on  01/07/2016 7:00:15 PM      MDM   Final diagnoses:  Chest pain, unspecified chest pain type    70 yo M with chest pain, delta trop negative, symptoms improved with gi cocktail.  D/c home.   11:51 PM:  I have discussed the diagnosis/risks/treatment options with the patient and  believe the pt to be eligible for discharge home to follow-up with PCP. We also discussed returning to the ED immediately if new or worsening sx occur. We discussed the sx which are most concerning (e.g., sudden worsening pain, fever, inability to tolerate by mouth) that necessitate immediate return. Medications administered to the patient during their visit and any new prescriptions provided to the patient are listed below.  Medications given during this visit Medications - No data to display  Discharge Medication List as of 01/07/2016 10:44 PM      The patient appears reasonably screen and/or stabilized for discharge and I doubt any other medical condition or other [facility] requiring further screening, evaluation, or treatment in the ED at this time prior to discharge.       Deno Etienne, DO 01/07/16 2351

## 2016-01-07 NOTE — ED Notes (Signed)
GEMS from home for CP onset 45 min pta, pressure, 8/10, intermittent, pain free on arrival, A/O X4, ambulatory and in NAD  324 ASA 1 nitro

## 2016-02-06 IMAGING — CT CT HEAD W/O CM
2 series · 16 of 30 positions shown, 20 images · non-contrast
Comparison: None.

CLINICAL DATA: Right leg weakness, onset today.

EXAM:
CT HEAD WITHOUT CONTRAST
TECHNIQUE: Contiguous axial images were obtained from the base of the skull
through the vertex without intravenous contrast.

[Series 2: head w/o · axial · non-contrast · 0.45mm/px · z∈[-130,-10]mm · 13 of 28 slices shown, 17 images]
[im 2/28  brain]
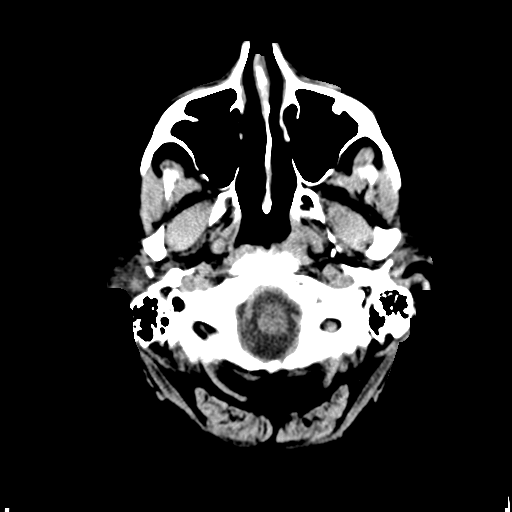
[im 2/28  bone]
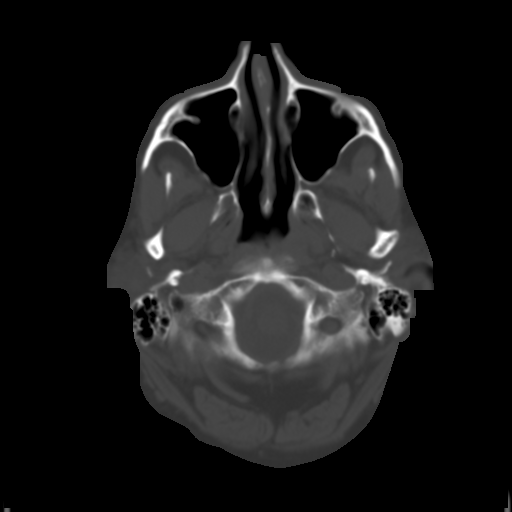
[im 4/28  brain]
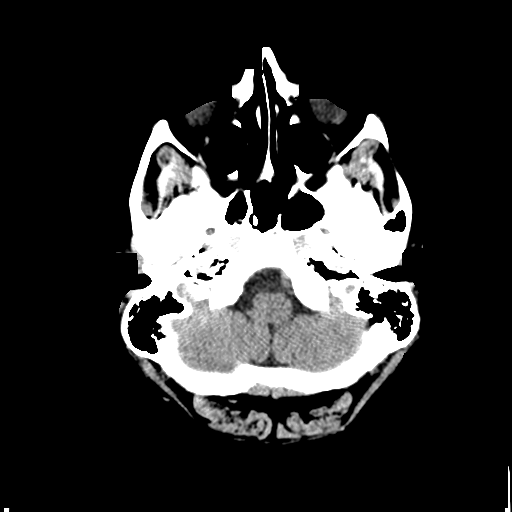
[im 6/28  brain]
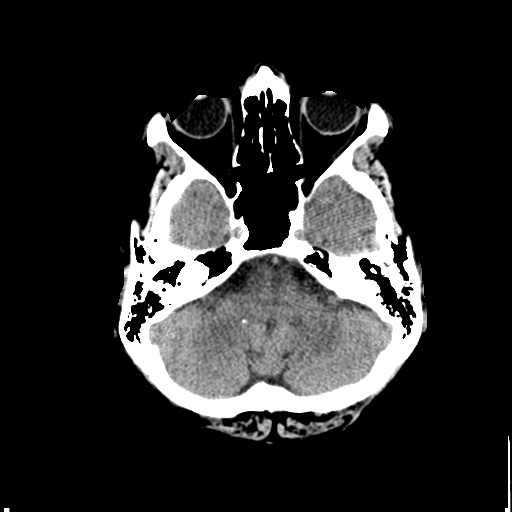
[im 8/28  brain]
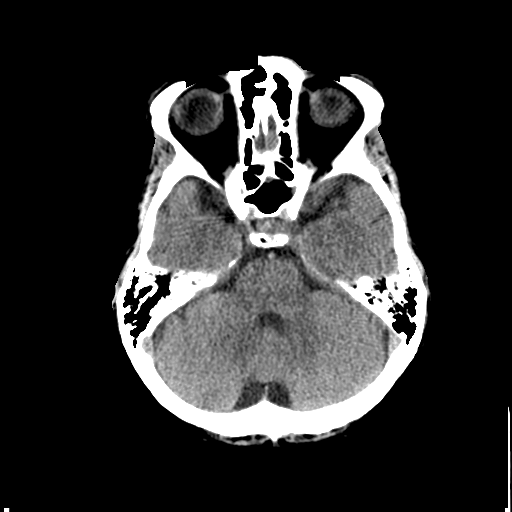
[im 10/28  brain]
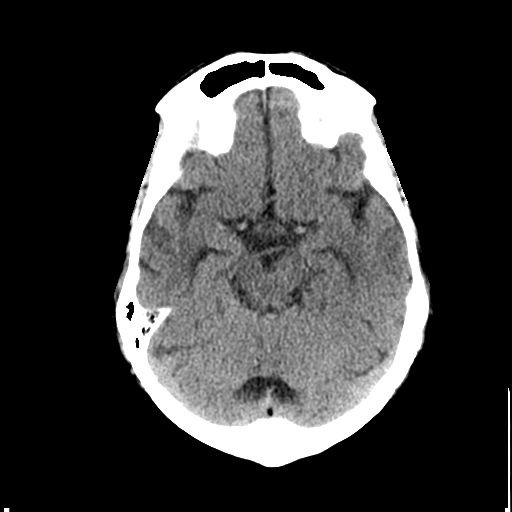
[im 10/28  bone]
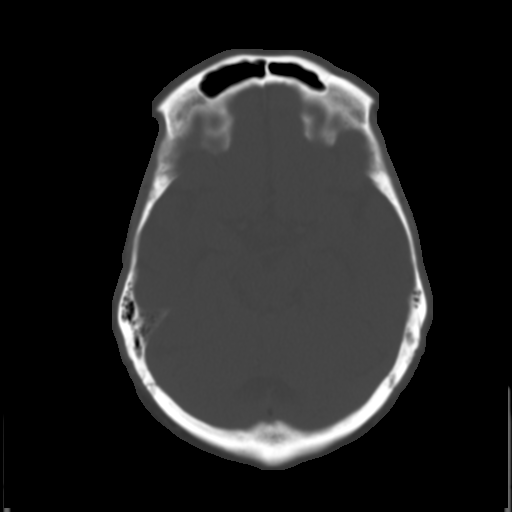
[im 12/28  brain]
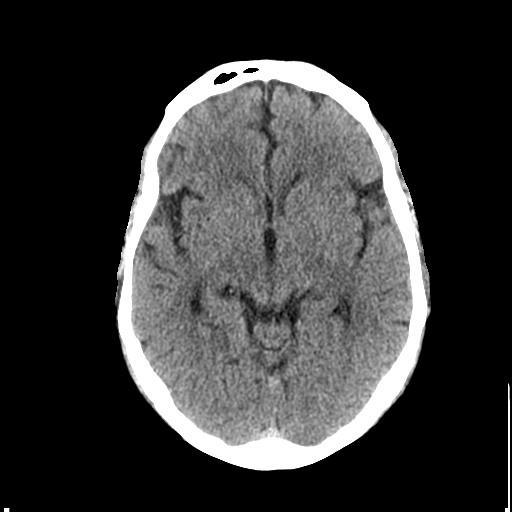
[im 14/28  brain]
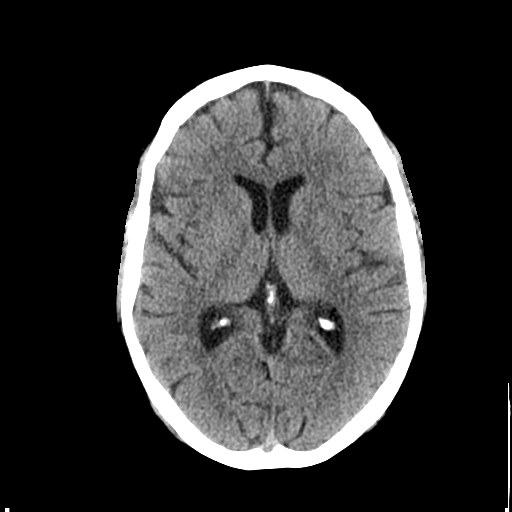
[im 16/28  brain]
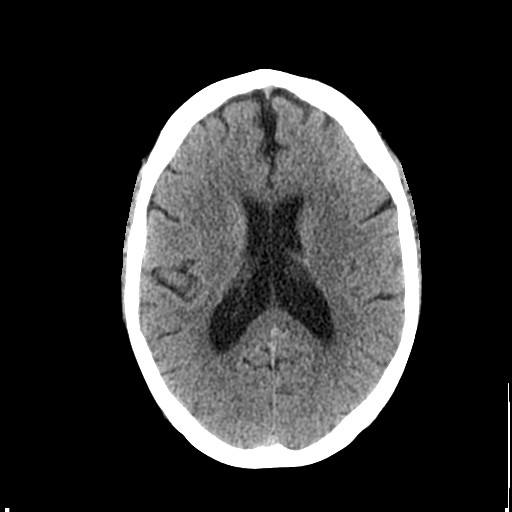
[im 18/28  brain]
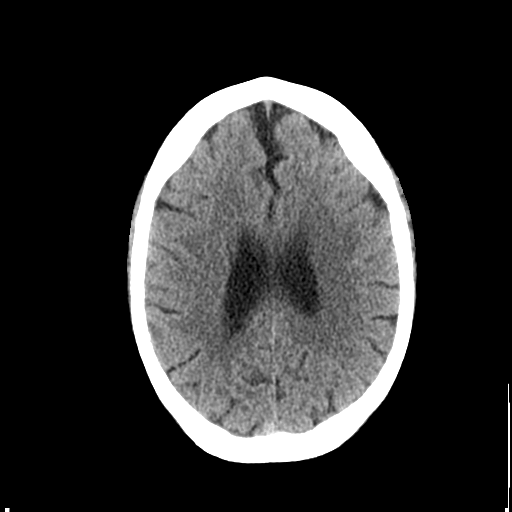
[im 18/28  bone]
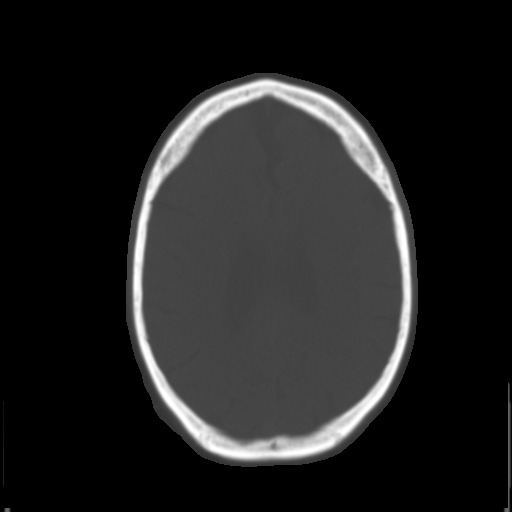
[im 20/28  brain]
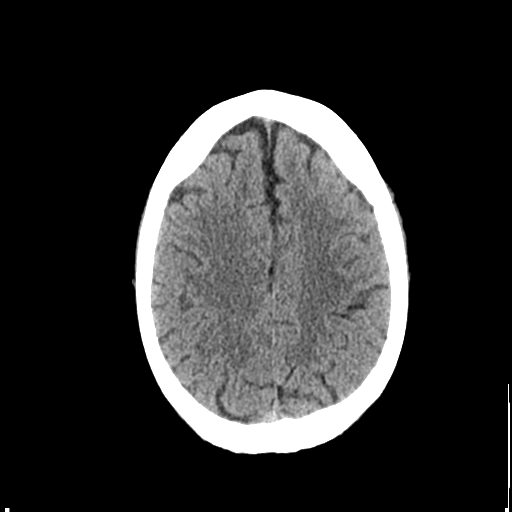
[im 22/28  brain]
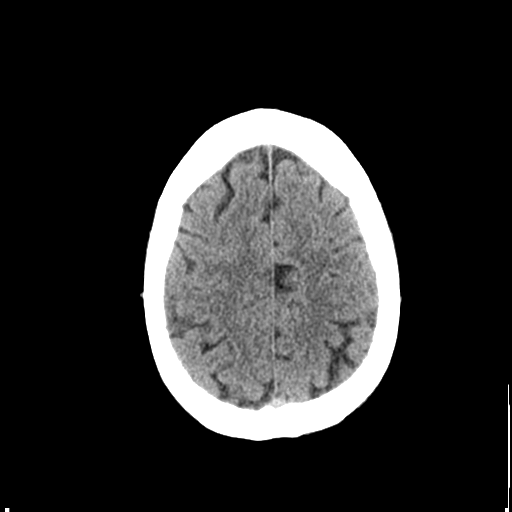
[im 24/28  brain]
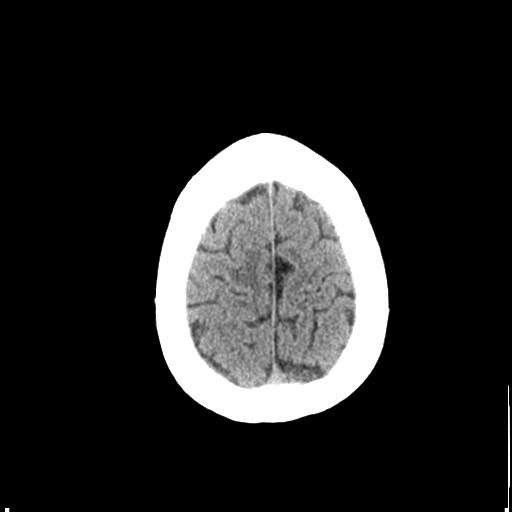
[im 26/28  brain]
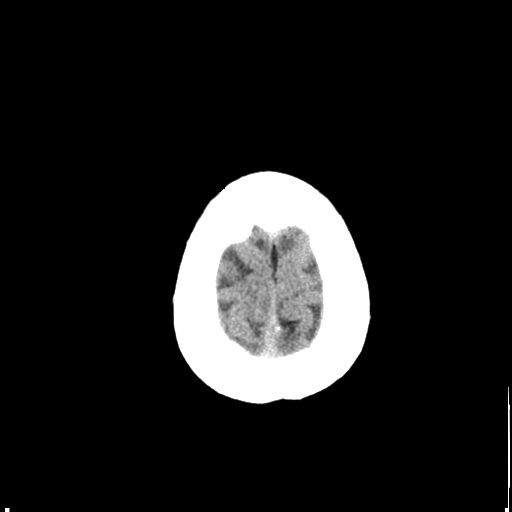
[im 26/28  bone]
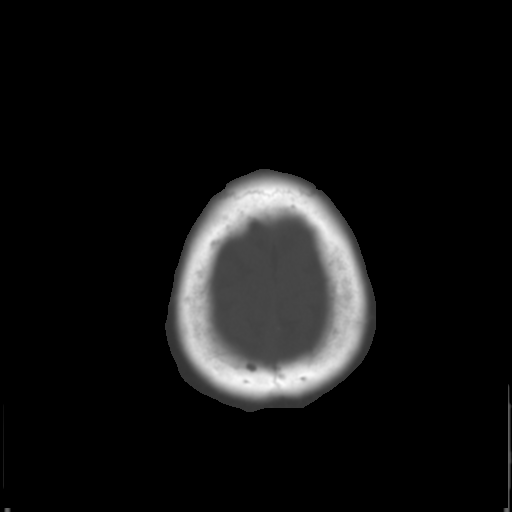

[Series 3: bone windows · axial · 0.45mm/px · z∈[-130,-90]mm · 3 of 28 slices shown]
[im 2/28  bone]
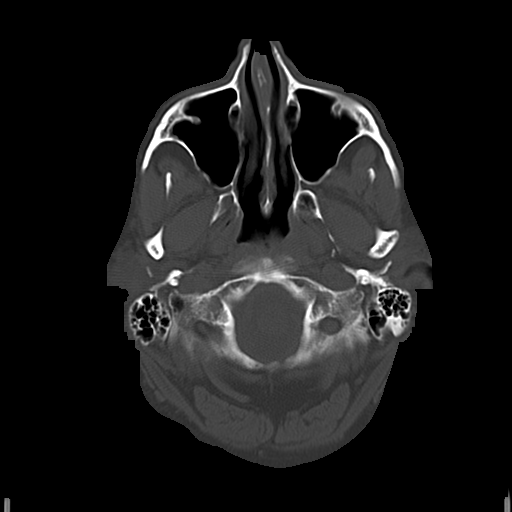
[im 6/28  bone]
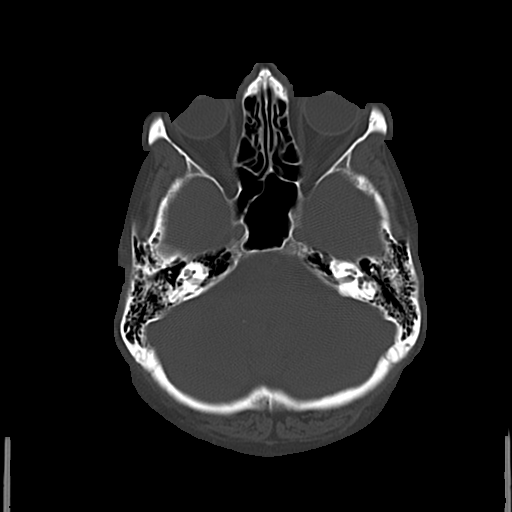
[im 10/28  bone]
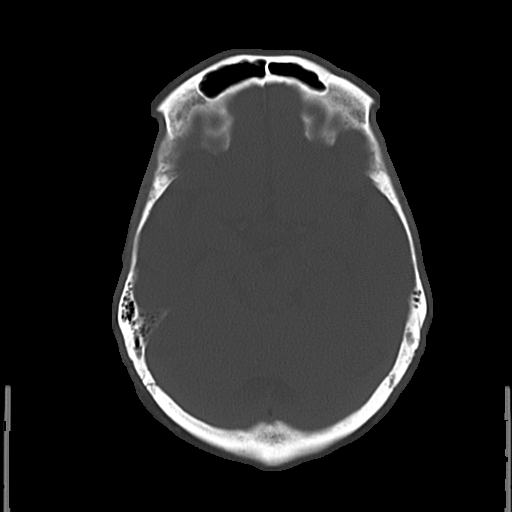

[16 of 30 positions shown; findings below may reference images not displayed]

FINDINGS: Ventricles and sulci appear symmetrical. No mass effect or midline
shift. No abnormal extra-axial fluid collections. Gray-white matter
junctions are distinct. Basal cisterns are not effaced. No evidence
of acute intracranial hemorrhage. No depressed skull fractures.
Visualized paranasal sinuses and mastoid air cells are not
opacified.
IMPRESSION: No acute intracranial abnormalities.

## 2016-12-09 ENCOUNTER — Encounter (HOSPITAL_COMMUNITY): Payer: Self-pay | Admitting: Emergency Medicine

## 2016-12-09 ENCOUNTER — Observation Stay (HOSPITAL_COMMUNITY)
Admission: EM | Admit: 2016-12-09 | Discharge: 2016-12-10 | Disposition: A | Discharge status: Home / Self Care | Payer: Medicare Other | Attending: Family Medicine | Admitting: Family Medicine

## 2016-12-09 ENCOUNTER — Emergency Department (HOSPITAL_COMMUNITY): Payer: Medicare Other

## 2016-12-09 DIAGNOSIS — Z7982 Long term (current) use of aspirin: Secondary | ICD-10-CM | POA: Diagnosis not present

## 2016-12-09 DIAGNOSIS — R079 Chest pain, unspecified: Secondary | ICD-10-CM | POA: Diagnosis present

## 2016-12-09 DIAGNOSIS — I208 Other forms of angina pectoris: Secondary | ICD-10-CM

## 2016-12-09 DIAGNOSIS — R072 Precordial pain: Secondary | ICD-10-CM

## 2016-12-09 DIAGNOSIS — Z79899 Other long term (current) drug therapy: Secondary | ICD-10-CM | POA: Diagnosis not present

## 2016-12-09 DIAGNOSIS — Z8673 Personal history of transient ischemic attack (TIA), and cerebral infarction without residual deficits: Secondary | ICD-10-CM | POA: Insufficient documentation

## 2016-12-09 DIAGNOSIS — I10 Essential (primary) hypertension: Secondary | ICD-10-CM | POA: Insufficient documentation

## 2016-12-09 DIAGNOSIS — I200 Unstable angina: Secondary | ICD-10-CM | POA: Diagnosis not present

## 2016-12-09 DIAGNOSIS — R0789 Other chest pain: Secondary | ICD-10-CM | POA: Diagnosis present

## 2016-12-09 LAB — CBC WITH DIFFERENTIAL/PLATELET
Basophils Absolute: 0 10*3/uL (ref 0.0–0.1)
Basophils Relative: 0 %
EOS PCT: 1 %
Eosinophils Absolute: 0.1 10*3/uL (ref 0.0–0.7)
HCT: 38 % — ABNORMAL LOW (ref 39.0–52.0)
HEMOGLOBIN: 13.3 g/dL (ref 13.0–17.0)
LYMPHS PCT: 25 %
Lymphs Abs: 1.4 10*3/uL (ref 0.7–4.0)
MCH: 33.7 pg (ref 26.0–34.0)
MCHC: 35 g/dL (ref 30.0–36.0)
MCV: 96.2 fL (ref 78.0–100.0)
Monocytes Absolute: 0.4 10*3/uL (ref 0.1–1.0)
Monocytes Relative: 7 %
Neutro Abs: 3.7 10*3/uL (ref 1.7–7.7)
Neutrophils Relative %: 67 %
PLATELETS: 136 10*3/uL — AB (ref 150–400)
RBC: 3.95 MIL/uL — ABNORMAL LOW (ref 4.22–5.81)
RDW: 11.7 % (ref 11.5–15.5)
WBC: 5.6 10*3/uL (ref 4.0–10.5)

## 2016-12-09 LAB — BASIC METABOLIC PANEL
Anion gap: 10 (ref 5–15)
BUN: 15 mg/dL (ref 6–20)
CHLORIDE: 100 mmol/L — AB (ref 101–111)
CO2: 25 mmol/L (ref 22–32)
CREATININE: 1.1 mg/dL (ref 0.61–1.24)
Calcium: 8.9 mg/dL (ref 8.9–10.3)
GFR calc Af Amer: >60 mL/min (ref >60)
GFR calc non Af Amer: >60 mL/min (ref >60)
Glucose, Bld: 109 mg/dL — ABNORMAL HIGH (ref 65–99)
Potassium: 3.9 mmol/L (ref 3.5–5.1)
Sodium: 135 mmol/L (ref 135–145)

## 2016-12-09 LAB — I-STAT TROPONIN, ED: Troponin i, poc: 0 ng/mL (ref 0.00–0.08)

## 2016-12-09 LAB — TSH: TSH: 2.683 u[IU]/mL (ref 0.350–4.500)

## 2016-12-09 LAB — LIPID PANEL
CHOL/HDL RATIO: 2.4 ratio
Cholesterol: 134 mg/dL (ref 0–200)
HDL: 55 mg/dL (ref >40)
LDL CALC: 47 mg/dL (ref 0–99)
TRIGLYCERIDES: 162 mg/dL — AB (ref <150)
VLDL: 32 mg/dL (ref 0–40)

## 2016-12-09 LAB — TROPONIN I

## 2016-12-09 LAB — D-DIMER, QUANTITATIVE: D-Dimer, Quant: 0.3 ug/mL-FEU (ref 0.00–0.50)

## 2016-12-09 MED ORDER — ONDANSETRON HCL 4 MG/2ML IJ SOLN: 4.0000 mg | Freq: Four times a day (QID) | INTRAMUSCULAR | Status: DC | PRN

## 2016-12-09 MED ORDER — ASPIRIN 81 MG PO CHEW
81.0000 mg | CHEWABLE_TABLET | Freq: Every day | ORAL | Status: DC
  Administered 2016-12-10: 81 mg via ORAL
  Filled 2016-12-09: qty 1

## 2016-12-09 MED ORDER — NITROGLYCERIN 0.4 MG SL SUBL
0.4000 mg | SUBLINGUAL_TABLET | SUBLINGUAL | Status: DC | PRN
Start: 2016-12-09 — End: 2016-12-10
  Administered 2016-12-09: 0.4 mg via SUBLINGUAL
  Filled 2016-12-09: qty 1

## 2016-12-09 MED ORDER — GI COCKTAIL ~~LOC~~: 30.0000 mL | Freq: Four times a day (QID) | ORAL | Status: DC | PRN

## 2016-12-09 MED ORDER — SODIUM CHLORIDE 0.9 % IV SOLN: 250.0000 mL | INTRAVENOUS | Status: DC | PRN

## 2016-12-09 MED ORDER — TERAZOSIN HCL 2 MG PO CAPS
8.0000 mg | ORAL_CAPSULE | Freq: Every day | ORAL | Status: DC
  Administered 2016-12-09: 8 mg via ORAL
  Filled 2016-12-09: qty 1

## 2016-12-09 MED ORDER — MORPHINE SULFATE (PF) 2 MG/ML IV SOLN: 2.0000 mg | INTRAVENOUS | Status: DC | PRN

## 2016-12-09 MED ORDER — PANTOPRAZOLE SODIUM 40 MG PO TBEC
40.0000 mg | DELAYED_RELEASE_TABLET | Freq: Every day | ORAL | Status: DC
  Administered 2016-12-09 – 2016-12-10 (×2): 40 mg via ORAL
  Filled 2016-12-09 (×2): qty 1

## 2016-12-09 MED ORDER — ENOXAPARIN SODIUM 40 MG/0.4ML ~~LOC~~ SOLN
40.0000 mg | SUBCUTANEOUS | Status: DC
  Administered 2016-12-09: 40 mg via SUBCUTANEOUS
  Filled 2016-12-09: qty 0.4

## 2016-12-09 MED ORDER — ENALAPRIL MALEATE 5 MG PO TABS
10.0000 mg | ORAL_TABLET | Freq: Every day | ORAL | Status: DC
  Administered 2016-12-09 – 2016-12-10 (×2): 10 mg via ORAL
  Filled 2016-12-09 (×2): qty 2

## 2016-12-09 MED ORDER — LEVOTHYROXINE SODIUM 50 MCG PO TABS
50.0000 ug | ORAL_TABLET | Freq: Every day | ORAL | Status: DC
  Administered 2016-12-10: 50 ug via ORAL
  Filled 2016-12-09: qty 1

## 2016-12-09 MED ORDER — ACETAMINOPHEN 325 MG PO TABS: 650.0000 mg | ORAL_TABLET | ORAL | Status: DC | PRN

## 2016-12-09 MED ORDER — SODIUM CHLORIDE 0.9% FLUSH: 3.0000 mL | INTRAVENOUS | Status: DC | PRN

## 2016-12-09 MED ORDER — SODIUM CHLORIDE 0.9% FLUSH
3.0000 mL | Freq: Two times a day (BID) | INTRAVENOUS | Status: DC
  Administered 2016-12-09 – 2016-12-10 (×3): 3 mL via INTRAVENOUS

## 2016-12-09 NOTE — Consult Note (Signed)
Reason for Consult: Chest pain  Requesting Physician: Hensel  Cardiologist: New  HPI: This is a 71 y.o. male non--smoker with a past medical history significant for hypertension, transient ischemic attack, but without diabetes mellitus, known coronary or peripheral vascular disease, congestive heart failure or other serious medical illnesses.   He presents with several episodes of brief anterior left chest discomfort occurring at rest, radiating towards the sternum, inconsistently worsened by breathing. He has noticed some mild exertional dyspnea for the last 2-3 days. Today around 9:30 AM he was at rest when he experienced the discomfort that lasted longer, for about 45 minutes. By the time he arrived at the emergency room symptoms had resolved. His electrocardiogram was normal and his first cardiac enzyme assay is normal.  He describes at least several months of exertional chest tightness. As long as he walks at a regular pace he has no complaints, but if he tries to jog or run he quickly becomes short of breath and develops chest tightness. This resolves within 1-2 minutes after cessation of activity. Location of the exertional discomfort is identical with the pain that brought him to the emergency room today. On the other hand the symptoms feel different, with today's pain being sharp.  In May 2017 had chest pain that led to an emergency room evaluation, symptoms seem to improve with GI cocktail, cardiac enzymes are negative. He has never had cardiac catheterization or stress testing that I can identify.  He has gastroesophageal reflux disease with symptoms that are typically well controlled with omeprazole.  He has had "several uncles" and that had sudden death, presumably of a heart attack, but does not have first-degree relatives with heart disease.  He receives [employer] benefits after 4 years in the [employer].  PMHx:  Past Medical History:  Diagnosis Date  . GERD (gastroesophageal  reflux disease)   . Gout   . Hypertension    Past Surgical History:  Procedure Laterality Date  . CHOLECYSTECTOMY    . HERNIA REPAIR      FAMHx: Family History  Problem Relation Age of Onset  . Brain cancer Mother   . Cancer Father     SOCHx:  reports that he has never smoked. He does not have any smokeless tobacco history on file. He reports that he drinks alcohol. He reports that he does not use drugs.  ALLERGIES: Allergies  Allergen Reactions  . Penicillins Other (See Comments)    "childhood allergy"    ROS: Pertinent items noted in HPI and remainder of comprehensive ROS otherwise negative.  HOME MEDICATIONS: Prescriptions Prior to Admission  Medication Sig Dispense Refill Last Dose  . cholecalciferol (VITAMIN D) 1000 UNITS tablet Take 2,000 Units by mouth daily.    12/08/2016 at Unknown time  . cyanocobalamin 1000 MCG tablet Take 1,000 mcg by mouth daily.    12/08/2016 at Unknown time  . enalapril (VASOTEC) 20 MG tablet Take 10 mg by mouth daily.   12/08/2016 at Unknown time  . levothyroxine (SYNTHROID, LEVOTHROID) 50 MCG tablet Take 50 mcg by mouth daily before breakfast.   12/08/2016 at Unknown time  . omeprazole (PRILOSEC) 20 MG capsule Take 40 mg by mouth daily.    12/08/2016 at Unknown time  . terazosin (HYTRIN) 2 MG capsule Take 8 mg by mouth at bedtime.   12/08/2016 at Unknown time  . aspirin 81 MG tablet Take 1 tablet (81 mg total) by mouth daily. (Patient not taking: Reported on 12/09/2016)   Not  Taking at Unknown time  . colchicine 0.6 MG tablet Take 0.6 mg by mouth daily as needed (for gout).    Unknown at [place]: Prior to Admission:  Prescriptions Prior to Admission  Medication Sig Dispense Refill Last Dose  . cholecalciferol (VITAMIN D) 1000 UNITS tablet Take 2,000 Units by mouth daily.    12/08/2016 at Unknown time  . cyanocobalamin 1000 MCG tablet Take 1,000 mcg by mouth daily.    12/08/2016 at Unknown time  . enalapril (VASOTEC) 20  MG tablet Take 10 mg by mouth daily.   12/08/2016 at Unknown time  . levothyroxine (SYNTHROID, LEVOTHROID) 50 MCG tablet Take 50 mcg by mouth daily before breakfast.   12/08/2016 at Unknown time  . omeprazole (PRILOSEC) 20 MG capsule Take 40 mg by mouth daily.    12/08/2016 at Unknown time  . terazosin (HYTRIN) 2 MG capsule Take 8 mg by mouth at bedtime.   12/08/2016 at Unknown time  . aspirin 81 MG tablet Take 1 tablet (81 mg total) by mouth daily. (Patient not taking: Reported on 12/09/2016)   Not Taking at Unknown time  . colchicine 0.6 MG tablet Take 0.6 mg by mouth daily as needed (for gout).    Unknown at Unknown    VITALS: Blood pressure 125/89, pulse 78, temperature 98.4 F (36.9 C), temperature source Oral, resp. rate 19, height 6' (1.829 m), weight 87.1 kg (192 lb), SpO2 96 %.  PHYSICAL EXAM:  General: Alert, oriented x3, no distress Head: no evidence of trauma, PERRL, EOMI, no exophtalmos or lid lag, no myxedema, no xanthelasma; normal ears, nose and oropharynx Neck: Normal jugular venous pulsations and no hepatojugular reflux; brisk carotid pulses without delay and no carotid bruits Chest: clear to auscultation, no signs of consolidation by percussion or palpation, normal fremitus, symmetrical and full respiratory excursions Cardiovascular: normal position and quality of the apical impulse, regular rhythm, normal first heart sound and normal second heart sound, no rubs or gallops, no murmur Abdomen: no tenderness or distention, no masses by palpation, no abnormal pulsatility or arterial bruits, normal bowel sounds, no hepatosplenomegaly Extremities: no clubbing, cyanosis;  no edema; 2+ radial, ulnar and brachial pulses bilaterally; 2+ right femoral, posterior tibial and dorsalis pedis pulses; 2+ left femoral, posterior tibial and dorsalis pedis pulses; no subclavian or femoral bruits Neurological: grossly nonfocal   LABS  CBC  Recent Labs  12/09/16 1056  WBC 5.6  NEUTROABS 3.7    HGB 13.3  HCT 38.0*  MCV 96.2  PLT 456*   Basic Metabolic Panel  Recent Labs  12/09/16 1056  NA 135  K 3.9  CL 100*  CO2 25  GLUCOSE 109*  BUN 15  CREATININE 1.10  CALCIUM 8.9   D-Dimer  Recent Labs  12/09/16 1056  DDIMER 0.30    IMAGING: Dg Chest 2 View  Result Date: 12/09/2016 CLINICAL DATA:  Mid LEFT chest pain. EXAM: CHEST  2 VIEW COMPARISON:  01/07/2016 FINDINGS: Normal mediastinum and cardiac silhouette. Normal pulmonary vasculature. No evidence of effusion, infiltrate, or pneumothorax. Mild pleural thickening along the lateral chest walls. No change from prior. No acute bony abnormality. IMPRESSION: No acute cardiopulmonary process. Bilateral pleural plaques. Electronically Signed   By: Suzy Bouchard M.D.   On: 12/09/2016 11:55    ECG: Normal sinus rhythm, normal tracing  TELEMETRY:  normal sinus rhythm  Note the absence of any coronary calcification or aortic atherosclerosis calcification seen on thoracic cuts of the CT abdomen/pelvis from 2015.  Minimal plaque is seen in the iliacs.  IMPRESSION: 1. Atypical chest discomfort at rest 2. Exertional angina 3. Treated hypertension 4. History of TIA  RECOMMENDATION: 1. Repeat cardiac enzymes 2. Treadmill stress test in AM if the cardiac enzymes and ECG remained normal. 3. If he develops further chest discomfort at rest, ECG abnormalities or if the cardiac enzymes are abnormal, I would proceed directly to coronary angiography. 4. Aspirin 5. At this point heparin does not appear indicated   Time Spent Directly with Patient: 45 minutes  Sanda Klein, MD, [facility] [phone] office [phone] pager   12/09/2016, 2:15 PM

## 2016-12-09 NOTE — ED Provider Notes (Signed)
[facility] DEPT Provider Note   CSN: [id number] Arrival date & time: 12/09/16  1030     History   Chief Complaint Chief Complaint  Patient presents with  . Chest Pain    HPI Ross Becker is a 71 y.o. male.  Patient is a 71 year old male with a history of hypertension, prior TIA and reflux. He presents with chest pain. He states over the last 2-3 days he's had some minute minute pain in his left chest. It starts in the lateral aspect of the chest and radiates toward the sternum. He states it's a little bit worse with deep breathing. It's nonexertional although he has had some shortness of breath on exertion over the last 2-3 days. He denies any current shortness of breath. He states his pain today started about 45 minutes prior to arrival and he currently denies any symptoms. It lasted total of about 45 minutes. He has a little bit of cough but no chest congestion. No leg swelling. He does have some vague discomfort in his left groin. No abdominal pain. No history of heart problems in the past. His last stress test was many years ago he says. He denies any family history of heart disease in his immediate family although he had several uncles that he says dropped dead of a heart attack.  He denies any smoking use. He states he does drink alcohol intermittently but not daily. He states he drinks sometimes a few beers a night and sometimes a mixed drink. He denies any alcohol use in the last 24 hours.      Past Medical History:  Diagnosis Date  . GERD (gastroesophageal reflux disease)   . Gout   . Hypertension     Patient Active Problem List   Diagnosis Date Noted  . Weakness of right foot 10/07/2014  . Right hemiplegia ([place]) 10/06/2014  . TIA (transient ischemic attack) 10/06/2014  . Hypertension 10/06/2014    Past Surgical History:  Procedure Laterality Date  . CHOLECYSTECTOMY    . HERNIA REPAIR         Home Medications    Prior to Admission medications     Medication Sig Start Date End Date Taking? Authorizing Provider  cholecalciferol (VITAMIN D) 1000 UNITS tablet Take 2,000 Units by mouth daily.    Yes Historical Provider, MD  cyanocobalamin 1000 MCG tablet Take 1,000 mcg by mouth daily.    Yes Historical Provider, MD  enalapril (VASOTEC) 20 MG tablet Take 10 mg by mouth daily.   Yes Historical Provider, MD  levothyroxine (SYNTHROID, LEVOTHROID) 50 MCG tablet Take 50 mcg by mouth daily before breakfast.   Yes Historical Provider, MD  omeprazole (PRILOSEC) 20 MG capsule Take 40 mg by mouth daily.    Yes Historical Provider, MD  terazosin (HYTRIN) 2 MG capsule Take 8 mg by mouth at bedtime.   Yes Historical Provider, MD  aspirin 81 MG tablet Take 1 tablet (81 mg total) by mouth daily. Patient not taking: Reported on 12/09/2016 10/08/14   Theodis Blaze, MD  colchicine 0.6 MG tablet Take 0.6 mg by mouth daily as needed (for gout).     Historical Provider, MD    Family History Family History  Problem Relation Age of Onset  . Brain cancer Mother   . Cancer Father     Social History Social History  Substance Use Topics  . Smoking status: Never Smoker  . Smokeless tobacco: Not on file  . Alcohol use Yes     Comment:  occasionally     Allergies   Penicillins   Review of Systems Review of Systems  Constitutional: Positive for fatigue. Negative for chills, diaphoresis and fever.  HENT: Negative for congestion, rhinorrhea and sneezing.   Eyes: Negative.   Respiratory: Positive for chest tightness and shortness of breath. Negative for cough.   Cardiovascular: Negative for chest pain and leg swelling.  Gastrointestinal: Negative for abdominal pain, blood in stool, diarrhea, nausea and vomiting.  Genitourinary: Negative for difficulty urinating, flank pain, frequency and hematuria.  Musculoskeletal: Negative for arthralgias and back pain.  Skin: Negative for rash.  Neurological: Negative for dizziness, speech difficulty, weakness,  numbness and headaches.     Physical Exam Updated Vital Signs BP (!) 147/88   Pulse 67   Temp 98.4 F (36.9 C) (Oral)   Resp 20   Ht 6' (1.829 m)   Wt 192 lb (87.1 kg)   SpO2 97%   BMI 26.04 kg/m   Physical Exam  Constitutional: He is oriented to person, place, and time. He appears well-developed and well-nourished.  HENT:  Head: Normocephalic and atraumatic.  Eyes: Pupils are equal, round, and reactive to light.  Neck: Normal range of motion. Neck supple.  Cardiovascular: Normal rate, regular rhythm and normal heart sounds.   Pulmonary/Chest: Effort normal and breath sounds normal. No respiratory distress. He has no wheezes. He has no rales. He exhibits no tenderness.  Abdominal: Soft. Bowel sounds are normal. There is no tenderness. There is no rebound and no guarding.  Musculoskeletal: Normal range of motion. He exhibits no edema.  No edema or calf tenderness  Lymphadenopathy:    He has no cervical adenopathy.  Neurological: He is alert and oriented to person, place, and time.  Skin: Skin is warm and dry. No rash noted.  Psychiatric: He has a normal mood and affect.     ED Treatments / Results  Labs (all labs ordered are listed, but only abnormal results are displayed) Labs Reviewed  BASIC METABOLIC PANEL - Abnormal; Notable for the following:       Result Value   Chloride 100 (*)    Glucose, Bld 109 (*)    All other components within normal limits  CBC WITH DIFFERENTIAL/PLATELET - Abnormal; Notable for the following:    RBC 3.95 (*)    HCT 38.0 (*)    Platelets 136 (*)    All other components within normal limits  D-DIMER, QUANTITATIVE (NOT AT [facility])  Randolm Idol, ED    EKG  EKG Interpretation  Date/Time:  Sunday December 09 2016 10:39:41 EDT Ventricular Rate:  61 PR Interval:    QRS Duration: 96 QT Interval:  409 QTC Calculation: 412 R Axis:   48 Text Interpretation:  Sinus rhythm Confirmed by Woody Kronberg  MD, Sharece Fleischhacker ([id number]) on 12/09/2016 10:44:04 AM        Radiology Dg Chest 2 View  Result Date: 12/09/2016 CLINICAL DATA:  Mid LEFT chest pain. EXAM: CHEST  2 VIEW COMPARISON:  01/07/2016 FINDINGS: Normal mediastinum and cardiac silhouette. Normal pulmonary vasculature. No evidence of effusion, infiltrate, or pneumothorax. Mild pleural thickening along the lateral chest walls. No change from prior. No acute bony abnormality. IMPRESSION: No acute cardiopulmonary process. Bilateral pleural plaques. Electronically Signed   By: Suzy Bouchard M.D.   On: 12/09/2016 11:55    Procedures Procedures (including critical care time)  Medications Ordered in ED Medications  nitroGLYCERIN (NITROSTAT) SL tablet 0.4 mg (not administered)     Initial Impression / Assessment and Plan /  ED Course  I have reviewed the triage vital signs and the nursing notes.  Pertinent labs & imaging results that were available during my care of the patient were reviewed by me and considered in my medical decision making (see chart for details).    HEART Score for Major Cardiac Events from [email or web address]  on 12/09/2016 ** All calculations should be rechecked by clinician prior to use **  RESULT SUMMARY: 4 points Moderate Score (4-6 points)  Risk of MACE of 12-16.6%.   INPUTS: History -> 1 = Moderately suspicious EKG -> 0 = Normal Age -> 2 = ?65 Risk factors -> 1 = 1-2 risk factors Initial troponin -> 0 = ?normal limit  Patient presents with chest pain. He's pain-free currently. He took a 325 mg aspirin prior to arrival. He was pain-free throughout majority of the ED course. He did have one episode where he had burning in his left chest. Repeat EKG was unchanged. I consulted family medicine whose on-call for unassigned and will admit the patient for further cardiac evaluation.  Final Clinical Impressions(s) / ED Diagnoses   Final diagnoses:  Unstable angina [facility])    New Prescriptions New Prescriptions   No medications on file     Malvin Johns,  MD 12/09/16 1234

## 2016-12-09 NOTE — ED Notes (Signed)
Pt reports sudden onset of nausea and burning in left chest about 5 mins ago, denies nausea at this time. DR Tamera Punt made aware

## 2016-12-09 NOTE — ED Triage Notes (Addendum)
Pt reports sharp left sided chest pain that occurs with inspiration. Pt reports pain began around 45 mins ago while he was lying in bed. Pt denies sob, resp e/u, nad. Pt had 325mg  asa pta.

## 2016-12-09 NOTE — ED Notes (Signed)
Pt has heart healthy diet order in place, lunch tray ordered.

## 2016-12-09 NOTE — H&P (Signed)
[facility] Admission History and Physical Service Pager: [phone]  Patient name: Ross Becker Medical record number: [id number] Date of birth: 1946/03/07 Age: 71 y.o. Gender: male  Primary Care Provider: [facility] Consultants: Cardiology  Code Status: Full (confirmed on admission)  Chief Complaint: Chest pain  Assessment and Plan: Ross Becker is a 71 y.o. male presenting with new onset chest pain and shortness of breath. PMH is significant for HTN, hypothyroidism, GERD, BPH, gout, h/o R peroneal palsy.  #Atypical chest pain: Acute. HEART score 4. No prior caths. Distant cardiac workup, however none on file. Former smoker 2 pack years. Initial troponin negative. EKG without ST changes or T-wave abn. CXR unremarkable. Not on ASA or other anticoagulation. Workup negative thus far, however concerning hx by symptoms and risk factors. PE unlikely with negative d-dimer. GERD is possible however burning in his different than baseline reflux. Other DDX includes MSK etiology. --Admit to observation on telemetry, attending Dr. Andria Frames --Cardiology consulted, appreciate recommendations: Trend troponin, treadmill stress test in morning if troponin and ECG normal, otherwise will proceed with coronary angiography, heparin not indicated at this time --ASA 81 mg QD, morphine IV 2 mg q2h PRN, nitroglycerin PRN --Trending troponin --Repeat EKG 12-lead in AM --NPO at midnight per above --Risk stratification labs pending: A1c --Cardiac monitoring  #Hypertension: Chronic. Stable. BP 150s over 90s on admission. Creatinine within normal limits. --Enalapril 10 mg QD, ASA 81 mg QD  #Hypothyroidism: Chronic. Stable. Last TSH elevated at 6.163 on 09/2014. --Synthroid 50 mcg QD --TSH pending  #GERD: Chronic. Stable. Chest pain may likely be from reflux, however characteristics not consistent with chronic. --Protonix 40 mg QD --GI cocktail q4h PRN and Zofran  PRN  #BPH: Chronic. Stable. On Terazolsin. No urinary issues on admission. --Terazolsin 8 mg QD  #Gout: Chronic. Stable. Several months since last attack. On colchicine PRN. --Avoid nephrotoxic agents, monitor outpatient  #Right peroneal palsy  Possible h/o TIA: Chronic. Stable. Documented possible TIA, however sounds like patient experience right peroneal palsy. [facility] of [facility] at [facility]. --Monitor outpatient  FEN/GI: HH diet, NPO at [place] Prophylaxis: Lovenox SQ  Disposition: ACS workup for chest pain. Pending troponin trend.  History of Present Illness:  Ross Becker is a 71 y.o. male presenting with new onset chest pain and shortness of breath. PMH is significant for HTN, hypothyroidism, GERD, BPH, gout, h/o R peroneal palsy.  Patient states he's been experiencing left centralized burning sensation since 0930 this morning with intermittent severity not worse with exertion and not relieved with rest. The pain radiates to the sternum. Patient denies fevers breath, palpitations, diaphoresis, cough. He states he occasionally has a palpitation. Patient states he is not received catheterization before he recalls having a stress test 4 years ago in [place] which he cannot recall the results. Patient did take a 325 aspirin this morning but did not take his blood pressure medications. Patient was brought by neighbor to [facility] ED  Upon arrival, VS afebrile, HR 60-70, BP 140-150/96, respirations 15-20 with 98% on room air. Pertinent labs: I-STAT troponin 0.00, d-dimer 0.30 (WNL), glucose 109, potassium 3.9, Cr 1.10, WBC 5.6, hgb 13.3, plts 136. EKG consistent with borderline bradycardia at 61 without ST changes or T-wave abnormalities. CXR negative except for incidental finding for bilateral pleural plaques. Cardiology consulted and patient admitted to observation under family practice teaching service.  Review Of Systems: Per HPI with the following additions: See above for pertinent. Otherwise  the remainder of the systems  were negative.  Review of Systems  Constitutional: Negative for chills and fever.  HENT: Negative for congestion and sore throat.   Eyes: Negative for blurred vision and double vision.  Respiratory: Negative for cough, shortness of breath and wheezing.   Cardiovascular: Positive for chest pain and palpitations. Negative for orthopnea, leg swelling and PND.  Gastrointestinal: Negative for abdominal pain, blood in stool, constipation, diarrhea, heartburn, melena, nausea and vomiting.  Genitourinary: Negative for dysuria, hematuria and urgency.  Musculoskeletal: Negative for back pain, falls and neck pain.  Skin: Negative for itching and rash.  Neurological: Negative for focal weakness and headaches.    Patient Active Problem List   Diagnosis Date Noted  . Weakness of right foot 10/07/2014  . Right hemiplegia ([place]) 10/06/2014  . TIA (transient ischemic attack) 10/06/2014  . Hypertension 10/06/2014    Past Medical History: Past Medical History:  Diagnosis Date  . GERD (gastroesophageal reflux disease)   . Gout   . Hypertension     Past Surgical History: Past Surgical History:  Procedure Laterality Date  . CHOLECYSTECTOMY    . HERNIA REPAIR      Social History: Social History  Substance Use Topics  . Smoking status: Never Smoker  . Smokeless tobacco: Not on file  . Alcohol use Yes     Comment: occasionally   Additional social history: Upset home alone. Drinks 7 beers per week 2 mix drinks. Former smoker, 2 pack years. Does not drive, states he did not renew his license because he didn't feel like he needed it. Please also refer to relevant sections of EMR.  Family History: Family History  Problem Relation Age of Onset  . Brain cancer Mother   . Cancer Father    New heart disease or history of strokes on mother or father's side.  Allergies and Medications: Allergies  Allergen Reactions  . Penicillins Other (See Comments)     "childhood allergy"   No current facility-administered medications on file prior to encounter.    Current Outpatient Prescriptions on File Prior to Encounter  Medication Sig Dispense Refill  . cholecalciferol (VITAMIN D) 1000 UNITS tablet Take 2,000 Units by mouth daily.     . cyanocobalamin 1000 MCG tablet Take 1,000 mcg by mouth daily.     . enalapril (VASOTEC) 20 MG tablet Take 10 mg by mouth daily.    Marland Kitchen levothyroxine (SYNTHROID, LEVOTHROID) 50 MCG tablet Take 50 mcg by mouth daily before breakfast.    . omeprazole (PRILOSEC) 20 MG capsule Take 40 mg by mouth daily.     Marland Kitchen terazosin (HYTRIN) 2 MG capsule Take 8 mg by mouth at bedtime.    Marland Kitchen aspirin 81 MG tablet Take 1 tablet (81 mg total) by mouth daily. (Patient not taking: Reported on 12/09/2016)    . colchicine 0.6 MG tablet Take 0.6 mg by mouth daily as needed (for gout).       Objective: BP (!) 147/75   Pulse 65   Temp 98.4 F (36.9 C) (Oral)   Resp 15   Ht 6' (1.829 m)   Wt 192 lb (87.1 kg)   SpO2 97%   BMI 26.04 kg/m  Exam: General: elderly male, well nourished, well developed, in no acute distress with non-toxic appearance HEENT: normocephalic, atraumatic, EOMI, PERRLA, moist mucous membranes Neck: supple, non-tender without lymphadenopathy CV: regular rate and rhythm without murmurs, rubs, or gallops Lungs: clear to auscultation bilaterally with normal work of breathing on room air Abdomen: soft, non-tender, no masses or  organomegaly palpable, normoactive bowel sounds Skin: warm, dry, no rashes or lesions, cap refill < 2 seconds Extremities: warm and well perfused, normal tone  Labs and Imaging: CBC BMET   Recent Labs Lab 12/09/16 1056  WBC 5.6  HGB 13.3  HCT 38.0*  PLT 136*    Recent Labs Lab 12/09/16 1056  NA 135  K 3.9  CL 100*  CO2 25  BUN 15  CREATININE 1.10  GLUCOSE 109*  CALCIUM 8.9     I-STAT troponin: 0.00 D-dimer: 0.30 (WNL) Lipid panel: Cholesterol 134 (WNL), triglyceride 162 (H), HDL  55 (WNL), LDL 47 (WNL) A1c: Pending TSH: Pending  DG Chest 2 View (12/09/2016) FINDINGS: Normal mediastinum and cardiac silhouette. Normal pulmonary vasculature. No evidence of effusion, infiltrate, or pneumothorax. Mild pleural thickening along the lateral chest walls. No change from prior. No acute bony abnormality.  IMPRESSION: No acute cardiopulmonary process. Bilateral pleural plaques.  [place] Bing, DO 12/09/2016, 12:17 PM PGY-1, [employer] Intern pager: [phone], text pages welcome   Upper Level Addendum:  I have seen and evaluated this patient along with Dr. Yisroel Ramming and reviewed the above note, making necessary revisions in red.   Elberta Leatherwood, MD,MS,  PGY3 12/09/2016 7:40 PM

## 2016-12-09 NOTE — Progress Notes (Signed)
I saw and examined this patient.  Discussed with Drs Yisroel Ramming and McKeag.  Agree with her documentation and management.  Will cosign their H&PE when available.  Patient admitted with chest pain and diaphoresis.  Patient is mostly atypical.  Has HBP and weak positive FHx of CAD.  Heart score=4.  Initial EKG and trop neg.  Will admit for ACS R/O and involve cards.  Likely stress test as initial WU.

## 2016-12-09 NOTE — ED Notes (Signed)
ED Provider at bedside. 

## 2016-12-10 ENCOUNTER — Encounter (HOSPITAL_COMMUNITY): Payer: Self-pay | Admitting: General Practice

## 2016-12-10 ENCOUNTER — Observation Stay (HOSPITAL_BASED_OUTPATIENT_CLINIC_OR_DEPARTMENT_OTHER): Payer: Medicare Other

## 2016-12-10 ENCOUNTER — Observation Stay (HOSPITAL_COMMUNITY): Payer: Medicare Other

## 2016-12-10 DIAGNOSIS — I200 Unstable angina: Secondary | ICD-10-CM | POA: Diagnosis not present

## 2016-12-10 DIAGNOSIS — R079 Chest pain, unspecified: Secondary | ICD-10-CM | POA: Diagnosis not present

## 2016-12-10 DIAGNOSIS — R0609 Other forms of dyspnea: Secondary | ICD-10-CM | POA: Diagnosis not present

## 2016-12-10 DIAGNOSIS — Z8673 Personal history of transient ischemic attack (TIA), and cerebral infarction without residual deficits: Secondary | ICD-10-CM | POA: Diagnosis not present

## 2016-12-10 DIAGNOSIS — I10 Essential (primary) hypertension: Secondary | ICD-10-CM | POA: Diagnosis not present

## 2016-12-10 DIAGNOSIS — Z79899 Other long term (current) drug therapy: Secondary | ICD-10-CM | POA: Diagnosis not present

## 2016-12-10 DIAGNOSIS — Z7982 Long term (current) use of aspirin: Secondary | ICD-10-CM | POA: Diagnosis not present

## 2016-12-10 DIAGNOSIS — R072 Precordial pain: Secondary | ICD-10-CM | POA: Diagnosis not present

## 2016-12-10 LAB — EXERCISE TOLERANCE TEST
CHL CUP RESTING HR STRESS: 104 {beats}/min
CSEPED: 4 min
CSEPEW: 6.6 METS
CSEPPHR: 151 {beats}/min
Exercise duration (sec): 42 s

## 2016-12-10 LAB — BASIC METABOLIC PANEL
ANION GAP: 8 (ref 5–15)
BUN: 14 mg/dL (ref 6–20)
CALCIUM: 8.8 mg/dL — AB (ref 8.9–10.3)
CO2: 27 mmol/L (ref 22–32)
Chloride: 102 mmol/L (ref 101–111)
Creatinine, Ser: 0.94 mg/dL (ref 0.61–1.24)
GFR calc Af Amer: >60 mL/min (ref >60)
GLUCOSE: 107 mg/dL — AB (ref 65–99)
Potassium: 4.3 mmol/L (ref 3.5–5.1)
Sodium: 137 mmol/L (ref 135–145)

## 2016-12-10 LAB — CBC
HCT: 38.1 % — ABNORMAL LOW (ref 39.0–52.0)
Hemoglobin: 13.4 g/dL (ref 13.0–17.0)
MCH: 33.9 pg (ref 26.0–34.0)
MCHC: 35.2 g/dL (ref 30.0–36.0)
MCV: 96.5 fL (ref 78.0–100.0)
PLATELETS: 150 10*3/uL (ref 150–400)
RBC: 3.95 MIL/uL — ABNORMAL LOW (ref 4.22–5.81)
RDW: 11.7 % (ref 11.5–15.5)
WBC: 6.7 10*3/uL (ref 4.0–10.5)

## 2016-12-10 LAB — ECHOCARDIOGRAM COMPLETE
Height: 71 in
Weight: 2979.2 oz

## 2016-12-10 LAB — HEMOGLOBIN A1C
Hgb A1c MFr Bld: 4.9 % (ref 4.8–5.6)
Mean Plasma Glucose: 94 mg/dL

## 2016-12-10 LAB — TROPONIN I: Troponin I: <0.03 ng/mL (ref <0.03)

## 2016-12-10 MED ORDER — ATORVASTATIN CALCIUM 40 MG PO TABS: 40.0000 mg | ORAL_TABLET | Freq: Every day | ORAL | 0 refills | Status: DC

## 2016-12-10 NOTE — Progress Notes (Addendum)
Progress Note  Patient Name: Ross Becker Date of Encounter: 12/10/2016  Primary Cardiologist: New  Subjective   No chest pain  Inpatient Medications    Scheduled Meds: . aspirin  81 mg Oral Daily  . enalapril  10 mg Oral Daily  . enoxaparin (LOVENOX) injection  40 mg Subcutaneous Q24H  . levothyroxine  50 mcg Oral QAC breakfast  . pantoprazole  40 mg Oral Daily  . sodium chloride flush  3 mL Intravenous Q12H  . terazosin  8 mg Oral QHS   Continuous Infusions:  PRN Meds: sodium chloride, acetaminophen, gi cocktail, morphine injection, nitroGLYCERIN, ondansetron (ZOFRAN) IV, sodium chloride flush   Vital Signs    Vitals:   12/09/16 1420 12/09/16 2330 12/10/16 0437 12/10/16 0831  BP: (!) 158/95 116/86 120/77 110/63  Pulse: 76 84 80 67  Resp: 20 19 18 18   Temp: 98 F (36.7 C) 98.1 F (36.7 C) 98 F (36.7 C) 98.2 F (36.8 C)  TempSrc: Oral   Oral  SpO2: 98% 96% 98% 95%  Weight: 189 lb 6 oz (85.9 kg)  186 lb 3.2 oz (84.5 kg)   Height: 5\' 11"  (1.803 m)       Intake/Output Summary (Last 24 hours) at 12/10/16 1059 Last data filed at 12/09/16 2331  Gross per 24 hour  Intake              123 ml  Output              775 ml  Net             -652 ml   Filed Weights   12/09/16 1041 12/09/16 1420 12/10/16 0437  Weight: 192 lb (87.1 kg) 189 lb 6 oz (85.9 kg) 186 lb 3.2 oz (84.5 kg)    Telemetry    NSR - Personally Reviewed  ECG    Normal Sinus Rhythm - Personally Reviewed  Physical Exam   GEN: No acute distress.   Neck: No JVD Cardiac: RRR, no murmurs, rubs, or gallops.  Respiratory: Clear to auscultation bilaterally.  GI: Soft, nontender, non-distended  MS: No edema; No deformity. Neuro:  Nonfocal  Psych: Normal affect   Labs    Chemistry Recent Labs Lab 12/09/16 1056 12/10/16 0715  NA 135 137  K 3.9 4.3  CL 100* 102  CO2 25 27  GLUCOSE 109* 107*  BUN 15 14  CREATININE 1.10 0.94  CALCIUM 8.9 8.8*  GFRNONAA >60 >60  GFRAA >60 >60    ANIONGAP 10 8     Hematology Recent Labs Lab 12/09/16 1056 12/10/16 0715  WBC 5.6 6.7  RBC 3.95* 3.95*  HGB 13.3 13.4  HCT 38.0* 38.1*  MCV 96.2 96.5  MCH 33.7 33.9  MCHC 35.0 35.2  RDW 11.7 11.7  PLT 136* 150    Cardiac Enzymes Recent Labs Lab 12/09/16 1814 12/09/16 2045 12/09/16 2345  TROPONINI <0.03 <0.03 <0.03    Recent Labs Lab 12/09/16 1117  TROPIPOC 0.00      DDimer  Recent Labs Lab 12/09/16 1056  DDIMER 0.30     Radiology    Dg Chest 2 View  Result Date: 12/09/2016 CLINICAL DATA:  Mid LEFT chest pain. EXAM: CHEST  2 VIEW COMPARISON:  01/07/2016 FINDINGS: Normal mediastinum and cardiac silhouette. Normal pulmonary vasculature. No evidence of effusion, infiltrate, or pneumothorax. Mild pleural thickening along the lateral chest walls. No change from prior. No acute bony abnormality. IMPRESSION: No acute cardiopulmonary process. Bilateral pleural plaques.   Cardiac  Studies   2D Echo Pending for today  Patient Profile     This is a 71 y.o. male non--smoker with a past medical history significant for hypertension, transient ischemic attack, but without diabetes mellitus, known coronary or peripheral vascular disease, congestive heart failure or other serious medical illnesses. Presented to the ED for chest pain.  Assessment & Plan     IMPRESSION: 1. Atypical chest discomfort at rest: Negative cardiac enzymes x 3. Stress test today-see below 2. Exertional angina: At this point given aspirin but hold off on Heparin 3. Treated hypertension 4. History of TIA   Plan: To discuss with MD, GXT was unremarkable for significant EKG changes but he should have been able to go further. ? Out of shape vs some other process, will need to review echo.   Angelena Form PA-C 12/10/2016 11:44 AM  I have seen and examined the patient along with Kerin Ransom PA-C.  I have reviewed the chart, notes and new data.  I agree with PA's note.  Key new  complaints: no chest discomfort Key examination changes: no arrhythmia or CHF Key new findings / data: echo is normal. Poor exercise tolerance on treadmill, but normal hemodynamics and no ECG changes  PLAN: Consider outpatient workup for his exertional symptoms. If no pulmonary abnormalities are identified on simple PFTs, recommend cardiopulmonary stress test or R and left heart cath to identify cause of his poor exercise tolerance.  Sanda Klein, MD, [place] [phone] 12/10/2016, 1:08 PM

## 2016-12-10 NOTE — Care Management Note (Signed)
Case Management Note  Patient Details  Name: Ross Becker MRN: [id number] Date of Birth: [birth year]  Subjective/Objective:  Pt presented for chest pain. Post stress test. PTA pt was independent.                   Action/Plan: No needs identified by CM at this time.   Expected Discharge Date:                  Expected Discharge Plan:  Home/Self Care  In-House Referral:  NA  Discharge planning Services  CM Consult  Post Acute Care Choice:  NA Choice offered to:  NA  DME Arranged:  N/A DME Agency:  NA  HH Arranged:  NA HH Agency:  NA  Status of Service:  Completed, signed off  If discussed at [place] of Stay Meetings, dates discussed:    Additional Comments:  Bethena Roys, RN 12/10/2016, 12:49 PM

## 2016-12-10 NOTE — Discharge Summary (Signed)
[facility] Discharge Summary  Patient name: Ross Becker Medical record number: [id number] Date of birth: [birth year] Age: 71 y.o. Gender: male Date of Admission: 12/09/2016  Date of Discharge: 12/10/2016 Admitting Physician: Zenia Resides, MD  Primary Care Provider: [facility] Consultants: Cardiology  Indication for Hospitalization: ACS rule out  Discharge Diagnoses/Problem List:  Patient Active Problem List   Diagnosis Date Noted  . Chest pain 12/09/2016  . Unstable angina ([place])   . Weakness of right foot 10/07/2014  . Right hemiplegia ([place]) 10/06/2014  . TIA (transient ischemic attack) 10/06/2014  . Hypertension 10/06/2014    Disposition: Home  Discharge Condition: Stable/Improved  Discharge Exam:  Temp:  [98 F (36.7 C)-98.4 F (36.9 C)] 98.2 F (36.8 C) (04/16 0831) Pulse Rate:  [63-84] 67 (04/16 0831) Resp:  [12-20] 18 (04/16 0831) BP: (110-164)/(63-99) 110/63 (04/16 0831) SpO2:  [95 %-100 %] 95 % (04/16 0831) Weight:  [186 lb 3.2 oz (84.5 kg)-192 lb (87.1 kg)] 186 lb 3.2 oz (84.5 kg) (04/16 0437) Physical Exam: General: NAD, sits comfortably, pleasant Cardiovascular: RRR, no m/r/g Respiratory: CTA bil no W/R/R Abdomen: soft  Mildly tender, nondistended Extremities: no LE edema   Brief Hospital Course:  Patient was hospitalized with atypical chest pain, admitted for acute coronary syndrome rule out. Troponin was followed and remained negative for 3 tests. D-dimer was within normal limits to think of PE. EKG was negative for indication of acute ischemia. Chest x-ray was unremarkable except for pleural plaques.  Patient was monitored on telemetry. In the morning he was taken for an exercise stress test which was negative for ST segment deviation during stress, and blood pressure demonstrated a normal response to exercise.   Issues for Follow Up:  1. Patient was discharged with Atorvastatin 40 mg for ASCVD risk  score ~13, Aspirin was continued 2. Cardiology recommended outpatient PFT's, consider asbestos given patient's history of being in the [employer]/likely exposure. Pulmonary cause for shortness of breath seemed most likely with normal echo and normal stress test.  Significant Procedures: Exercise stress test, Cardiac Echo  Significant Labs and Imaging:   -STAT troponin: 0.00 Troponin <0.03 x3 D-dimer: 0.30 (WNL) Lipid panel: Cholesterol 134 (WNL), triglyceride 162 (H), HDL 55 (WNL), LDL 47 (WNL) A1c: Pending TSH: 2.683    Recent Labs Lab 12/09/16 1056 12/10/16 0715  WBC 5.6 6.7  HGB 13.3 13.4  HCT 38.0* 38.1*  PLT 136* 150    Recent Labs Lab 12/09/16 1056 12/10/16 0715  NA 135 137  K 3.9 4.3  CL 100* 102  CO2 25 27  GLUCOSE 109* 107*  BUN 15 14  CREATININE 1.10 0.94  CALCIUM 8.9 8.8*   Cardiac Echo 12/10/2016  Study Conclusions - Left ventricle: The cavity size was normal. Systolic function was   normal. The estimated ejection fraction was in the range of 55%   to 60%. Wall motion was normal; there were no regional wall   motion abnormalities. Left ventricular diastolic function   parameters were normal.  DG Chest 2 View (12/09/2016) FINDINGS: Normal mediastinum and cardiac silhouette. Normal pulmonary vasculature. No evidence of effusion, infiltrate, or pneumothorax. Mild pleural thickening along the lateral chest walls. No change from prior. No acute bony abnormality.  IMPRESSION: No acute cardiopulmonary process. Bilateral pleural plaques.  Exercise Stress Test 12/10/2016 Blood pressure demonstrated a normal response to exercise There was no ST segment deviation noted during stress  Results/Tests Pending at Time of Discharge: HbA1c  Discharge Medications:  Allergies as of 12/10/2016      Reactions   Penicillins Other (See Comments)   "childhood allergy"      Medication List    TAKE these medications   aspirin 81 MG tablet Take 1 tablet (81 mg total)  by mouth daily.   atorvastatin 40 MG tablet Commonly known as:  LIPITOR Take 1 tablet (40 mg total) by mouth daily.   cholecalciferol 1000 units tablet Commonly known as:  VITAMIN D Take 2,000 Units by mouth daily.   colchicine 0.6 MG tablet Take 0.6 mg by mouth daily as needed (for gout).   cyanocobalamin 1000 MCG tablet Take 1,000 mcg by mouth daily.   enalapril 20 MG tablet Commonly known as:  VASOTEC Take 10 mg by mouth daily.   levothyroxine 50 MCG tablet Commonly known as:  SYNTHROID, LEVOTHROID Take 50 mcg by mouth daily before breakfast.   omeprazole 20 MG capsule Commonly known as:  PRILOSEC Take 40 mg by mouth daily.   terazosin 2 MG capsule Commonly known as:  HYTRIN Take 8 mg by mouth at bedtime.       Discharge Instructions: Please refer to Patient Instructions section of EMR for full details.  Patient was counseled important signs and symptoms that should prompt return to medical care, changes in medications, dietary instructions, activity restrictions, and follow up appointments.   Follow-Up Appointments: Follow-up Information    [facility] Follow up.   Specialty:  General Practice Why:  Please call and make a follow-up appointment to be seen by your regular doctor within 1 week. Contact information: [address] [place] [address] [phone]           Everrett Coombe, MD 12/10/2016, 1:31 PM PGY-1, [place]

## 2016-12-10 NOTE — Progress Notes (Signed)
  Echocardiogram 2D Echocardiogram has been performed.  Yannely Kintzel T Lygia Olaes 12/10/2016, 10:55 AM

## 2016-12-10 NOTE — Care Management Obs Status (Signed)
[facility] NOTIFICATION   Patient Details  Name: Ross Becker MRN: [id number] Date of Birth: [birth year]   Medicare Observation Status Notification Given:  Yes    Bethena Roys, RN 12/10/2016, 12:48 PM

## 2016-12-10 NOTE — Progress Notes (Signed)
Family Medicine Teaching Service Daily Progress Note Intern Pager: [phone]  Patient name: Ross Becker Medical record number: [id number] Date of birth: 05/08/1946 Age: 71 y.o. Gender: male  Primary Care Provider: [facility] Consultants: Cardiology Code Status: Full  Pt Overview and Major Events to Date:  4/16 - Admit FPTS for ACS r/o  Assessment and Plan: Ross Becker is a 71 y.o. male presenting with new onset chest pain and shortness of breath. PMH is significant for HTN, hypothyroidism, GERD, BPH, gout, h/o R peroneal palsy.   Atypical chest pain, ACS rule-out : Troponin negative x3, AM EKG without ST changes or T-wave abn. PE considered however D-dimer negative.   - Admit to observation on telemetry, attending Dr. Andria Frames - Cardiology consulted, appreciate recommendations: Trend troponin, treadmill stress test in morning if troponin and ECG normal - ASA 81 mg QD, morphine IV 2 mg q2h PRN, nitroglycerin PRN - Risk stratification labs pending: A1c - continue Cardiac monitoring - ASCVD risk score ~13, consider start high intensity statin  Hypertension, stable: currently normotensive. Home medication enalapril 10 mg QD - continue home ACE  Hypothyroidism: chronic, stable. Home med levothyroxine 50 mcg QD. TSH WNL this admission. - continue home Synthroid  GERD: chronic, stable. Presenting symptom chest pain may likely be from reflux, however per patient characteristics not consistent with chronic GERD symptoms. - Protonix 40 mg QD - GI cocktail q4h PRN and Zofran PRN  BPH: Chronic. Stable. On Terazolsin. No urinary issues on admission. - Terazolsin 8 mg QD  Gout: Chronic. Stable. Several months since last attack. On colchicine PRN. - Avoid nephrotoxic agents, monitor outpatient  Right peroneal palsy  Possible h/o TIA: Chronic. Stable. Documented possible TIA, however sounds like patient experience right peroneal palsy. All of by neurology at the  [facility]. - Monitor outpatient  FEN/GI: HH diet, NPO at MN Prophylaxis: Lovenox SQ  Disposition: ACS workup for chest pain.   Subjective:  No acute events overnight. Biggest complaint is GI upset this AM.  Objective: Temp:  [98 F (36.7 C)-98.4 F (36.9 C)] 98.2 F (36.8 C) (04/16 0831) Pulse Rate:  [63-84] 67 (04/16 0831) Resp:  [12-20] 18 (04/16 0831) BP: (110-164)/(63-99) 110/63 (04/16 0831) SpO2:  [95 %-100 %] 95 % (04/16 0831) Weight:  [186 lb 3.2 oz (84.5 kg)-192 lb (87.1 kg)] 186 lb 3.2 oz (84.5 kg) (04/16 0437) Physical Exam: General: NAD, sits comfortably, pleasant Cardiovascular: RRR, no m/r/g Respiratory: CTA bil no W/R/R Abdomen: soft  Mildly tender, nondistended Extremities: no LE edema  Laboratory: I-STAT troponin: 0.00 Troponin <0.03 x3 D-dimer: 0.30 (WNL) Lipid panel: Cholesterol 134 (WNL), triglyceride 162 (H), HDL 55 (WNL), LDL 47 (WNL) A1c: Pending TSH: 2.683    Recent Labs Lab 12/09/16 1056 12/10/16 0715  WBC 5.6 6.7  HGB 13.3 13.4  HCT 38.0* 38.1*  PLT 136* 150    Recent Labs Lab 12/09/16 1056 12/10/16 0715  NA 135 137  K 3.9 4.3  CL 100* 102  CO2 25 27  BUN 15 14  CREATININE 1.10 0.94  CALCIUM 8.9 8.8*  GLUCOSE 109* 107*   Imaging/Diagnostic Tests:  DG Chest 2 View (12/09/2016) FINDINGS: Normal mediastinum and cardiac silhouette. Normal pulmonary vasculature. No evidence of effusion, infiltrate, or pneumothorax. Mild pleural thickening along the lateral chest walls. No change from prior. No acute bony abnormality.  IMPRESSION: No acute cardiopulmonary process. Bilateral pleural plaques.   Ross Coombe, MD 12/10/2016, 9:29 AM PGY-1, [place] Intern pager: [phone], text pages welcome

## 2016-12-30 ENCOUNTER — Emergency Department (HOSPITAL_COMMUNITY)
Admission: EM | Admit: 2016-12-30 | Discharge: 2016-12-30 | Disposition: A | Discharge status: Home / Self Care | Payer: Medicare Other | Attending: Emergency Medicine | Admitting: Emergency Medicine

## 2016-12-30 ENCOUNTER — Emergency Department (HOSPITAL_COMMUNITY): Payer: Medicare Other

## 2016-12-30 ENCOUNTER — Encounter (HOSPITAL_COMMUNITY): Payer: Self-pay

## 2016-12-30 DIAGNOSIS — J019 Acute sinusitis, unspecified: Secondary | ICD-10-CM | POA: Diagnosis not present

## 2016-12-30 DIAGNOSIS — I10 Essential (primary) hypertension: Secondary | ICD-10-CM | POA: Insufficient documentation

## 2016-12-30 DIAGNOSIS — R0602 Shortness of breath: Secondary | ICD-10-CM | POA: Diagnosis not present

## 2016-12-30 DIAGNOSIS — Z7982 Long term (current) use of aspirin: Secondary | ICD-10-CM | POA: Insufficient documentation

## 2016-12-30 DIAGNOSIS — Z8673 Personal history of transient ischemic attack (TIA), and cerebral infarction without residual deficits: Secondary | ICD-10-CM | POA: Diagnosis not present

## 2016-12-30 DIAGNOSIS — J3489 Other specified disorders of nose and nasal sinuses: Secondary | ICD-10-CM | POA: Diagnosis present

## 2016-12-30 DIAGNOSIS — R05 Cough: Secondary | ICD-10-CM | POA: Diagnosis not present

## 2016-12-30 DIAGNOSIS — R059 Cough, unspecified: Secondary | ICD-10-CM

## 2016-12-30 LAB — BASIC METABOLIC PANEL
Anion gap: 10 (ref 5–15)
BUN: 12 mg/dL (ref 6–20)
CO2: 23 mmol/L (ref 22–32)
Calcium: 9 mg/dL (ref 8.9–10.3)
Chloride: 106 mmol/L (ref 101–111)
Creatinine, Ser: 1.16 mg/dL (ref 0.61–1.24)
GFR calc Af Amer: >60 mL/min (ref >60)
GLUCOSE: 105 mg/dL — AB (ref 65–99)
POTASSIUM: 4.7 mmol/L (ref 3.5–5.1)
Sodium: 139 mmol/L (ref 135–145)

## 2016-12-30 LAB — CBC
HEMATOCRIT: 41.5 % (ref 39.0–52.0)
Hemoglobin: 14.8 g/dL (ref 13.0–17.0)
MCH: 33.7 pg (ref 26.0–34.0)
MCHC: 35.7 g/dL (ref 30.0–36.0)
MCV: 94.5 fL (ref 78.0–100.0)
Platelets: 276 10*3/uL (ref 150–400)
RBC: 4.39 MIL/uL (ref 4.22–5.81)
RDW: 11.2 % — AB (ref 11.5–15.5)
WBC: 9.2 10*3/uL (ref 4.0–10.5)

## 2016-12-30 LAB — D-DIMER, QUANTITATIVE: D-Dimer, Quant: 0.6 ug/mL-FEU — ABNORMAL HIGH (ref 0.00–0.50)

## 2016-12-30 LAB — I-STAT TROPONIN, ED: Troponin i, poc: 0.01 ng/mL (ref 0.00–0.08)

## 2016-12-30 MED ORDER — BENZONATATE 100 MG PO CAPS: 100.0000 mg | ORAL_CAPSULE | Freq: Three times a day (TID) | ORAL | 0 refills | Status: DC

## 2016-12-30 MED ORDER — HYDROCOD POLST-CPM POLST ER 10-8 MG/5ML PO SUER: 5.0000 mL | Freq: Two times a day (BID) | ORAL | 0 refills | Status: DC

## 2016-12-30 MED ORDER — DOXYCYCLINE HYCLATE 100 MG PO CAPS: 100.0000 mg | ORAL_CAPSULE | Freq: Two times a day (BID) | ORAL | 0 refills | Status: DC

## 2016-12-30 NOTE — ED Notes (Signed)
Per main lab there is no blue top in lab for patient.

## 2016-12-30 NOTE — ED Notes (Signed)
PT back in treatment room and is changing into gown.

## 2016-12-30 NOTE — Discharge Instructions (Signed)
Follow-up with your primary care physician. Return to ER as needed.

## 2016-12-30 NOTE — ED Triage Notes (Signed)
Onset 6 days runny nose, bodyaches.  PCP prescribed zpack and cough med.  Onset 2 days chest tightness, shortness of breath.  NAD at triage, talking incomplete sentences.

## 2016-12-30 NOTE — ED Notes (Signed)
Pt returned from radiology and ambulatory to restroom.

## 2016-12-30 NOTE — ED Notes (Signed)
PT in radiology  

## 2016-12-30 NOTE — ED Provider Notes (Signed)
[facility] DEPT Provider Note   CSN: [id number] Arrival date & time: 12/30/16  1928     History   Chief Complaint Chief Complaint  Patient presents with  . Chest Pain  . Shortness of Breath    HPI Ross Becker is a 71 y.o. male.Chief complaint is "it's my sinuses".  HPI this is a 71 year old male who describes sinus pain and pressure with upper tooth pain and nasal congestion for the last several weeks. He was admitted and underwent cardiac evaluation for some tightness in his chest last month. He had negative nuclear medicine scan. Normal serial enzymes. He has no known cardiac history. No history of PE. No risk for PE.  Pain is today. History of hypertension gout and reflux.  Externally swelling or edema. States he tried over-the-counter medicines without relief. He denies chest pain, visual symptoms, DOE, PND or leg edema.  Past Medical History:  Diagnosis Date  . GERD (gastroesophageal reflux disease)   . Gout   . Hypertension     Patient Active Problem List   Diagnosis Date Noted  . Chest pain 12/09/2016  . Unstable angina ([place])   . Weakness of right foot 10/07/2014  . Right hemiplegia ([place]) 10/06/2014  . TIA (transient ischemic attack) 10/06/2014  . Hypertension 10/06/2014    Past Surgical History:  Procedure Laterality Date  . CHOLECYSTECTOMY    . HERNIA REPAIR         Home Medications    Prior to Admission medications   Medication Sig Start Date End Date Taking? Authorizing Provider  aspirin 81 MG tablet Take 1 tablet (81 mg total) by mouth daily. 10/08/14  Yes Theodis Blaze, MD  atorvastatin (LIPITOR) 40 MG tablet Take 1 tablet (40 mg total) by mouth daily. 12/10/16  Yes Everrett Coombe, MD  cholecalciferol (VITAMIN D) 1000 UNITS tablet Take 2,000 Units by mouth daily.    Yes [provider]  colchicine 0.6 MG tablet Take 0.6 mg by mouth daily as needed (for gout).    Yes [provider]  cyanocobalamin 1000 MCG tablet Take  1,000 mcg by mouth daily.    Yes [provider]  enalapril (VASOTEC) 20 MG tablet Take 10 mg by mouth daily.   Yes [provider]  levothyroxine (SYNTHROID, LEVOTHROID) 50 MCG tablet Take 50 mcg by mouth daily before breakfast.   Yes [provider]  omeprazole (PRILOSEC) 20 MG capsule Take 40 mg by mouth daily.    Yes [provider]  terazosin (HYTRIN) 2 MG capsule Take 8 mg by mouth at bedtime.   Yes [provider]  benzonatate (TESSALON) 100 MG capsule Take 1 capsule (100 mg total) by mouth every 8 (eight) hours. 12/30/16   Tanna Furry, MD  chlorpheniramine-HYDROcodone [facility] ER) 10-8 MG/5ML SUER Take 5 mLs by mouth 2 (two) times daily. 12/30/16   Tanna Furry, MD  doxycycline (VIBRAMYCIN) 100 MG capsule Take 1 capsule (100 mg total) by mouth 2 (two) times daily. 12/30/16   Tanna Furry, MD    Family History Family History  Problem Relation Age of Onset  . Brain cancer Mother   . Cancer Father     Social History Social History  Substance Use Topics  . Smoking status: Never Smoker  . Smokeless tobacco: Never Used  . Alcohol use Yes     Comment: occasionally     Allergies   Mushroom extract complex and Penicillins   Review of Systems Review of Systems  Constitutional: Negative for  appetite change, chills, diaphoresis, fatigue and fever.  HENT: Positive for rhinorrhea, sinus pain and sinus pressure. Negative for mouth sores, sore throat and trouble swallowing.   Eyes: Negative for visual disturbance.  Respiratory: Positive for cough. Negative for chest tightness, shortness of breath and wheezing.   Cardiovascular: Negative for chest pain.  Gastrointestinal: Negative for abdominal distention, abdominal pain, diarrhea, nausea and vomiting.  Endocrine: Negative for polydipsia, polyphagia and polyuria.  Genitourinary: Negative for dysuria, frequency and hematuria.  Musculoskeletal: Negative for gait problem.  Skin:  Negative for color change, pallor and rash.  Neurological: Negative for dizziness, syncope, light-headedness and headaches.  Hematological: Does not bruise/bleed easily.  Psychiatric/Behavioral: Negative for behavioral problems and confusion.     Physical Exam Updated Vital Signs BP 128/80   Pulse 71   Temp 98.3 F (36.8 C) (Oral)   Resp 17   SpO2 94%   Physical Exam  Constitutional: He is oriented to person, place, and time. He appears well-developed and well-nourished. No distress.  Patient presents anxious. Heart rate at 1:15. Arrest is 74.  HENT:  Head: Normocephalic.  Clear nares. Conjunctiva not pale. Posterior face benign.  Eyes: Conjunctivae are normal. Pupils are equal, round, and reactive to light. No scleral icterus.  Neck: Normal range of motion. Neck supple. No thyromegaly present.  Cardiovascular: Normal rate and regular rhythm.  Exam reveals no gallop and no friction rub.   No murmur heard. Pulmonary/Chest: Effort normal and breath sounds normal. No respiratory distress. He has no wheezes. He has no rales.  Clear bilateral breath sounds.  Abdominal: Soft. Bowel sounds are normal. He exhibits no distension. There is no tenderness. There is no rebound.  Musculoskeletal: Normal range of motion.  Neurological: He is alert and oriented to person, place, and time.  Skin: Skin is warm and dry. No rash noted.  Psychiatric: He has a normal mood and affect. His behavior is normal.   no dependent edema.   ED Treatments / Results  Labs (all labs ordered are listed, but only abnormal results are displayed) Labs Reviewed  BASIC METABOLIC PANEL - Abnormal; Notable for the following:       Result Value   Glucose, Bld 105 (*)    All other components within normal limits  CBC - Abnormal; Notable for the following:    RDW 11.2 (*)    All other components within normal limits  D-DIMER, QUANTITATIVE (NOT AT [facility]) - Abnormal; Notable for the following:    D-Dimer, Quant 0.60  (*)    All other components within normal limits  I-STAT TROPOININ, ED    EKG  EKG Interpretation  Date/Time:  Sunday Dec 30 2016 19:36:54 EDT Ventricular Rate:  116 PR Interval:  152 QRS Duration: 68 QT Interval:  320 QTC Calculation: 444 R Axis:   5 Text Interpretation:  Sinus tachycardia Change in R progression v. 12-10-2016 Confirmed by Jeneen Rinks  MD, [place] ([id number]) on 12/30/2016 7:58:26 PM       Radiology Dg Chest 2 View  Result Date: 12/30/2016 CLINICAL DATA:  Chest tightness x 2 days on the right side, SOB today, HTN, nonsmoker. Hx pna and pleurisy. EXAM: CHEST  2 VIEW COMPARISON:  12/09/2016 FINDINGS: Moderate right hemidiaphragm elevation. Midline trachea. Normal heart size and mediastinal contours. No pleural effusion or pneumothorax. Bilateral pleural thickening laterally. Clear lungs. IMPRESSION: No acute cardiopulmonary disease. Electronically Signed   By: Abigail Miyamoto M.D.   On: 12/30/2016 20:01    Procedures Procedures (including critical care time)  Medications Ordered in ED Medications - No data to display   Initial Impression / Assessment and Plan / ED Course  I have reviewed the triage vital signs and the nursing notes.  Pertinent labs & imaging results that were available during my care of the patient were reviewed by me and considered in my medical decision making (see chart for details).     EKG without change. Normal troponin. Age corrected d-dimer is 48. His been 31 last month. 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 I'm not concerned at this present represent primary embolus. He is low risk for this. Symptoms are consistent with upper rest for infection. He had been given Zithromax by primary care for 5 days. Take his symptoms are most consistent with sinusitis. This may be viral allergic are bacterial. We'll continue him on 10 days antibiotics. Cough suppressants. Over-the-counter Zyrtec  and Flonase.  Final Clinical Impressions(s) / ED Diagnoses   Final diagnoses:  Acute sinusitis, recurrence not specified, unspecified location  Cough    New Prescriptions Discharge Medication List as of 12/30/2016 10:11 PM    START taking these medications   Details  benzonatate (TESSALON) 100 MG capsule Take 1 capsule (100 mg total) by mouth every 8 (eight) hours., Starting Sun 12/30/2016, Print    chlorpheniramine-HYDROcodone (TUSSIONEX PENNKINETIC ER) 10-8 MG/5ML SUER Take 5 mLs by mouth 2 (two) times daily., Starting Sun 12/30/2016, Print    doxycycline (VIBRAMYCIN) 100 MG capsule Take 1 capsule (100 mg total) by mouth 2 (two) times daily., Starting Sun 12/30/2016, Print         Tanna Furry, MD 12/31/16 0000

## 2017-10-01 ENCOUNTER — Ambulatory Visit (INDEPENDENT_AMBULATORY_CARE_PROVIDER_SITE_OTHER): Payer: Medicare Other | Admitting: Orthopaedic Surgery

## 2017-10-01 ENCOUNTER — Encounter (INDEPENDENT_AMBULATORY_CARE_PROVIDER_SITE_OTHER): Payer: Self-pay | Admitting: Orthopaedic Surgery

## 2017-10-01 DIAGNOSIS — S83232A Complex tear of medial meniscus, current injury, left knee, initial encounter: Secondary | ICD-10-CM | POA: Insufficient documentation

## 2017-10-01 DIAGNOSIS — M1712 Unilateral primary osteoarthritis, left knee: Secondary | ICD-10-CM | POA: Diagnosis not present

## 2017-10-01 MED ORDER — BUPIVACAINE HCL 0.5 % IJ SOLN
2.0000 mL | INTRAMUSCULAR | Status: AC | PRN
  Administered 2017-10-01: 2 mL via INTRA_ARTICULAR

## 2017-10-01 MED ORDER — LIDOCAINE HCL 1 % IJ SOLN
2.0000 mL | INTRAMUSCULAR | Status: AC | PRN
  Administered 2017-10-01: 2 mL

## 2017-10-01 MED ORDER — METHYLPREDNISOLONE ACETATE 40 MG/ML IJ SUSP
40.0000 mg | INTRAMUSCULAR | Status: AC | PRN
  Administered 2017-10-01: 40 mg via INTRA_ARTICULAR

## 2017-10-01 NOTE — Progress Notes (Signed)
Office Visit Note   Patient: Ross Becker           Date of Birth: 06/16/1946           MRN: [id number] Visit Date: 10/01/2017              Requested by: Center, [facility] [address] [place], [place] [address] PCP: Jessie: Visit Diagnoses:  1. Complex tear of medial meniscus of left knee, unspecified whether old or current tear, initial encounter   2. Unilateral primary osteoarthritis, left knee     Plan: Impression is a 72 year old gentleman with complex medial meniscal tear with superimposed degenerative joint disease.  We agreed to perform aspiration and injection to see if this will calm down his meniscal tear although I think his pain is multifactorial.  We will reevaluate in 2 weeks to see how he responds to injection.  If he continues to have the sudden mechanical pain then we would discussed arthroscopic debridement at that time.  Follow-Up Instructions: Return in about 2 weeks (around 10/15/2017).   Orders:  No orders of the defined types were placed in this encounter.  No orders of the defined types were placed in this encounter.     Procedures: Large Joint Inj: R knee on 10/01/2017 1:44 PM Indications: pain Details: 22 G needle  Arthrogram: No  Medications: 40 mg methylPREDNISolone acetate 40 MG/ML; 2 mL lidocaine 1 %; 2 mL bupivacaine 0.5 % Aspirate: 20 mL clear Outcome: tolerated well, no immediate complications Consent was given by the patient. Patient was prepped and draped in the usual sterile fashion.       Clinical Data: No additional findings.   Subjective: Chief Complaint  Patient presents with  . Left Knee - Pain    HPI Ross Becker is a pleasant 72 year old new patient who presents to our clinic today with left knee pain.  This is been ongoing for the past several months.  No recent injury and no change in activity.  He does however note a remote injury from the late 1980s when he pivoted and began to  have left knee pain.  No previous surgical intervention or cortisone injections.  The pain he has been having over the past several months is all medial aspect.  He describes this as a constant toothache worse at the end of the day.  He does have rest pain and night pain as well.  No locking catching or instability.  He has tried Tylenol with minimal no relief of symptoms.  He was seen at the [facility] or x-rays and MRI was obtained.  This showed complex medial meniscus tear with medial sided marrow edema.  Review of Systems  Constitutional: Negative.   All other systems reviewed and are negative.  as detailed in HPI.  All others reviewed and are negative.   Objective: Vital Signs: There were no vitals taken for this visit.  Physical Exam  Constitutional: He is oriented to person, place, and time. He appears well-developed and well-nourished.  HENT:  Head: Normocephalic and atraumatic.  Eyes: Pupils are equal, round, and reactive to light.  Neck: Neck supple.  Pulmonary/Chest: Effort normal.  Abdominal: Soft.  Musculoskeletal: Normal range of motion.  Neurological: He is alert and oriented to person, place, and time.  Skin: Skin is warm.  Psychiatric: He has a normal mood and affect. His behavior is normal. Judgment and thought content normal.  Nursing note and vitals reviewed.  well-developed  well-nourished gentleman in no acute distress.  Alert and oriented x3.  Ortho Exam Left knee exam shows a small effusion.  Collaterals and cruciates are grossly intact.  Well-preserved joint motion. Specialty Comments:  No specialty comments available.  Imaging: No results found.   PMFS History: Patient Active Problem List   Diagnosis Date Noted  . Complex tear of medial meniscus of left knee 10/01/2017  . Chest pain 12/09/2016  . Unstable angina ([place])   . Weakness of right foot 10/07/2014  . Right hemiplegia ([place]) 10/06/2014  . TIA (transient ischemic attack) 10/06/2014  . Hypertension  10/06/2014   Past Medical History:  Diagnosis Date  . GERD (gastroesophageal reflux disease)   . Gout   . Hypertension     Family History  Problem Relation Age of Onset  . Brain cancer Mother   . Cancer Father     Past Surgical History:  Procedure Laterality Date  . CHOLECYSTECTOMY    . HERNIA REPAIR     Social History   Occupational History  . Not on file  Tobacco Use  . Smoking status: Never Smoker  . Smokeless tobacco: Never Used  Substance and Sexual Activity  . Alcohol use: Yes    Comment: occasionally  . Drug use: No  . Sexual activity: Not on file

## 2017-10-24 ENCOUNTER — Telehealth (INDEPENDENT_AMBULATORY_CARE_PROVIDER_SITE_OTHER): Payer: Self-pay | Admitting: Orthopaedic Surgery

## 2017-10-24 NOTE — Telephone Encounter (Signed)
10/01/2017 OV NOTE REFAXED TO [facility] [phone]

## 2017-11-06 ENCOUNTER — Telehealth (INDEPENDENT_AMBULATORY_CARE_PROVIDER_SITE_OTHER): Payer: Self-pay | Admitting: Orthopaedic Surgery

## 2017-11-06 NOTE — Telephone Encounter (Signed)
Received another request from [facility] indicating "3rd request" IC Jodi,the nurse navigator st [phone] ext [phone], left msg stating I have the records twice now and obvisiously they are not getting to her. I asked her to call me back with an alternate fax number or her email.

## 2017-11-12 NOTE — Telephone Encounter (Signed)
Ross Becker called and lm on my vm giving a fax of [phone] to fax records. I faxed records again to number she left

## 2017-11-25 ENCOUNTER — Ambulatory Visit (INDEPENDENT_AMBULATORY_CARE_PROVIDER_SITE_OTHER): Payer: Non-veteran care | Admitting: Orthopaedic Surgery

## 2017-11-25 ENCOUNTER — Encounter (INDEPENDENT_AMBULATORY_CARE_PROVIDER_SITE_OTHER): Payer: Self-pay | Admitting: Orthopaedic Surgery

## 2017-11-25 DIAGNOSIS — M1712 Unilateral primary osteoarthritis, left knee: Secondary | ICD-10-CM | POA: Diagnosis not present

## 2017-11-25 DIAGNOSIS — S83232A Complex tear of medial meniscus, current injury, left knee, initial encounter: Secondary | ICD-10-CM | POA: Diagnosis not present

## 2017-11-25 NOTE — Progress Notes (Signed)
Office Visit Note   Patient: Ross Becker           Date of Birth: 04-14-1946           MRN: [id number] Visit Date: 11/25/2017              Requested by: [facility] [address] [place], [place] [address] PCP: [place]: Visit Diagnoses:  1. Complex tear of medial meniscus of left knee, unspecified whether old or current tear, initial encounter   2. Unilateral primary osteoarthritis, left knee     Plan: I reviewed the MRI findings with Mr. Pavon and the tentative plan is to proceed with arthroscopic knee surgery with debridement of the medial meniscal tear and chondroplasty and synovectomy as indicated.  I also have asked him to obtain the MRI so that I can review the images for surgical planning.  Once we have the MRI we will schedule him for surgery.  Based on the radiologist findings patient may benefit from some chondroplasty also. Total face to face encounter time was greater than 25 minutes and over half of this time was spent in counseling and/or coordination of care.  Follow-Up Instructions: Return if symptoms worsen or fail to improve.   Orders:  No orders of the defined types were placed in this encounter.  No orders of the defined types were placed in this encounter.     Procedures: No procedures performed   Clinical Data: No additional findings.   Subjective: Chief Complaint  Patient presents with  . Left Knee - Pain    HPI Ross Becker comes in for follow-up.  We saw him on 10/01/2017 with left knee pain.  It was noted that he had a complex tear of the medial meniscus as well as medial compartment marrow edema based on an MRI obtained from the [place].  We were only able to look at the report as we did not have the images.  At that visit, we aspirated and injected his left knee with cortisone.  This only helped temporarily.  He has continued to have pain which is described by constant ache worse with pivoting of the  knee.  He has been taking Tylenol with minimal relief of symptoms.  He does note a recent CT scan of his chest which reveals possible mesothelioma.  He is scheduled to follow-up with the [place] at the end of May for this.  Review of Systems as detailed in HPI.  All others reviewed and are negative.   Objective: Vital Signs: There were no vitals taken for this visit.  Physical Exam well-developed well-nourished gentleman no acute distress.  Alert and oriented x3.  Ortho Exam stable exam  Specialty Comments:  No specialty comments available.  Imaging: No new imaging   PMFS History: Patient Active Problem List   Diagnosis Date Noted  . Unilateral primary osteoarthritis, left knee 11/25/2017  . Complex tear of medial meniscus of left knee 10/01/2017  . Chest pain 12/09/2016  . Unstable angina ([place])   . Weakness of right foot 10/07/2014  . Right hemiplegia ([place]) 10/06/2014  . TIA (transient ischemic attack) 10/06/2014  . Hypertension 10/06/2014   Past Medical History:  Diagnosis Date  . GERD (gastroesophageal reflux disease)   . Gout   . Hypertension     Family History  Problem Relation Age of Onset  . Brain cancer Mother   . Cancer Father     Past Surgical History:  Procedure Laterality Date  . CHOLECYSTECTOMY    . HERNIA REPAIR     Social History   Occupational History  . Not on file  Tobacco Use  . Smoking status: Never Smoker  . Smokeless tobacco: Never Used  Substance and Sexual Activity  . Alcohol use: Yes    Comment: occasionally  . Drug use: No  . Sexual activity: Not on file

## 2017-11-26 ENCOUNTER — Telehealth (INDEPENDENT_AMBULATORY_CARE_PROVIDER_SITE_OTHER): Payer: Self-pay | Admitting: Orthopaedic Surgery

## 2017-11-26 NOTE — Telephone Encounter (Signed)
I received vm from patient stating we needed to request his MRI CD from the [facility]. He stated he called asking for it but they told him we needed to request it. I faxed note requesting MRI CD be mailed to Dr. Erlinda Hong to [phone]

## 2018-02-20 ENCOUNTER — Encounter (HOSPITAL_BASED_OUTPATIENT_CLINIC_OR_DEPARTMENT_OTHER): Payer: Self-pay | Admitting: *Deleted

## 2018-02-25 ENCOUNTER — Encounter (HOSPITAL_BASED_OUTPATIENT_CLINIC_OR_DEPARTMENT_OTHER)
Admission: RE | Admit: 2018-02-25 | Discharge: 2018-02-25 | Disposition: A | Discharge status: Home / Self Care | Payer: Medicare Other | Source: Ambulatory Visit | Attending: Orthopaedic Surgery | Admitting: Orthopaedic Surgery

## 2018-02-25 DIAGNOSIS — Z79899 Other long term (current) drug therapy: Secondary | ICD-10-CM | POA: Diagnosis not present

## 2018-02-25 DIAGNOSIS — E039 Hypothyroidism, unspecified: Secondary | ICD-10-CM | POA: Diagnosis not present

## 2018-02-25 DIAGNOSIS — Z880 Allergy status to penicillin: Secondary | ICD-10-CM | POA: Diagnosis not present

## 2018-02-25 DIAGNOSIS — M23222 Derangement of posterior horn of medial meniscus due to old tear or injury, left knee: Secondary | ICD-10-CM | POA: Diagnosis present

## 2018-02-25 DIAGNOSIS — M1990 Unspecified osteoarthritis, unspecified site: Secondary | ICD-10-CM | POA: Diagnosis not present

## 2018-02-25 DIAGNOSIS — K219 Gastro-esophageal reflux disease without esophagitis: Secondary | ICD-10-CM | POA: Diagnosis not present

## 2018-02-25 DIAGNOSIS — I10 Essential (primary) hypertension: Secondary | ICD-10-CM | POA: Diagnosis not present

## 2018-02-25 DIAGNOSIS — M65862 Other synovitis and tenosynovitis, left lower leg: Secondary | ICD-10-CM | POA: Diagnosis not present

## 2018-02-25 NOTE — Anesthesia Preprocedure Evaluation (Addendum)
Anesthesia Evaluation  Patient identified by MRN, date of birth, ID band Patient awake    Reviewed: Allergy & Precautions, H&P , NPO status , Patient's Chart, lab work & pertinent test results, reviewed documented beta blocker date and time   Airway Mallampati: II  TM Distance: >3 FB Neck ROM: full    Dental no notable dental hx.    Pulmonary    Pulmonary exam normal breath sounds clear to auscultation       Cardiovascular Exercise Tolerance: Good hypertension, Pt. on medications  Rhythm:regular Rate:Normal  Echo 4/18 Left ventricle:  The cavity size was normal. Systolic function was normal. The estimated ejection fraction was in the range of 55% to 60%. Wall motion was normal; there were no regional wall motion abnormalities. The transmitral flow pattern was normal. The deceleration time of the early transmitral flow velocity was normal. The pulmonary vein flow pattern was normal. The tissue Doppler parameters were normal. Left ventricular diastolic function parameters were normal.   Neuro/Psych NOT TIA...Marland Kitchenfelt by neurologist at [facility] to be Neuropathy     GI/Hepatic GERD  ,  Endo/Other  Hypothyroidism   Renal/GU   negative genitourinary   Musculoskeletal  (+) Arthritis ,   Abdominal   Peds  Hematology   Anesthesia Other Findings   Reproductive/Obstetrics                           Anesthesia Physical Anesthesia Plan  ASA: III  Anesthesia Plan: General   Post-op Pain Management:    Induction: Intravenous  PONV Risk Score and Plan: 2 and Dexamethasone, Ondansetron and Treatment may vary due to age or medical condition  Airway Management Planned: LMA and Oral ETT  Additional Equipment:   Intra-op Plan:   Post-operative Plan: Extubation in OR  Informed Consent: I have reviewed the patients History and Physical, chart, labs and discussed the procedure including the risks,  benefits and alternatives for the proposed anesthesia with the patient or authorized representative who has indicated his/her understanding and acceptance.   Dental Advisory Given  Plan Discussed with: CRNA, Anesthesiologist and Surgeon  Anesthesia Plan Comments: ( )        Anesthesia Quick Evaluation

## 2018-02-26 ENCOUNTER — Other Ambulatory Visit: Payer: Self-pay

## 2018-02-26 ENCOUNTER — Institutional Professional Consult (permissible substitution): Payer: Medicare Other | Admitting: Internal Medicine

## 2018-02-26 ENCOUNTER — Ambulatory Visit (HOSPITAL_BASED_OUTPATIENT_CLINIC_OR_DEPARTMENT_OTHER)
Admission: RE | Admit: 2018-02-26 | Discharge: 2018-02-26 | Disposition: A | Discharge status: Home / Self Care | Payer: Non-veteran care | Source: Ambulatory Visit | Attending: Orthopaedic Surgery | Admitting: Orthopaedic Surgery

## 2018-02-26 ENCOUNTER — Encounter (HOSPITAL_BASED_OUTPATIENT_CLINIC_OR_DEPARTMENT_OTHER)
Admission: RE | Disposition: A | Discharge status: Home / Self Care | Payer: Self-pay | Source: Ambulatory Visit | Attending: Orthopaedic Surgery

## 2018-02-26 ENCOUNTER — Encounter (HOSPITAL_BASED_OUTPATIENT_CLINIC_OR_DEPARTMENT_OTHER): Payer: Self-pay | Admitting: Anesthesiology

## 2018-02-26 ENCOUNTER — Ambulatory Visit (HOSPITAL_BASED_OUTPATIENT_CLINIC_OR_DEPARTMENT_OTHER): Payer: Non-veteran care | Admitting: Anesthesiology

## 2018-02-26 DIAGNOSIS — M659 Synovitis and tenosynovitis, unspecified: Secondary | ICD-10-CM

## 2018-02-26 DIAGNOSIS — I10 Essential (primary) hypertension: Secondary | ICD-10-CM | POA: Insufficient documentation

## 2018-02-26 DIAGNOSIS — M65862 Other synovitis and tenosynovitis, left lower leg: Secondary | ICD-10-CM | POA: Diagnosis not present

## 2018-02-26 DIAGNOSIS — E039 Hypothyroidism, unspecified: Secondary | ICD-10-CM | POA: Insufficient documentation

## 2018-02-26 DIAGNOSIS — Z79899 Other long term (current) drug therapy: Secondary | ICD-10-CM | POA: Insufficient documentation

## 2018-02-26 DIAGNOSIS — M23222 Derangement of posterior horn of medial meniscus due to old tear or injury, left knee: Secondary | ICD-10-CM | POA: Insufficient documentation

## 2018-02-26 DIAGNOSIS — M23322 Other meniscus derangements, posterior horn of medial meniscus, left knee: Secondary | ICD-10-CM

## 2018-02-26 DIAGNOSIS — K219 Gastro-esophageal reflux disease without esophagitis: Secondary | ICD-10-CM | POA: Diagnosis not present

## 2018-02-26 DIAGNOSIS — Z880 Allergy status to penicillin: Secondary | ICD-10-CM | POA: Insufficient documentation

## 2018-02-26 DIAGNOSIS — M1990 Unspecified osteoarthritis, unspecified site: Secondary | ICD-10-CM | POA: Insufficient documentation

## 2018-02-26 HISTORY — DX: Unspecified osteoarthritis, unspecified site: M19.90

## 2018-02-26 HISTORY — DX: Benign prostatic hyperplasia without lower urinary tract symptoms: N40.0

## 2018-02-26 HISTORY — DX: Diverticulitis of intestine, part unspecified, without perforation or abscess without bleeding: K57.92

## 2018-02-26 HISTORY — DX: Hypothyroidism, unspecified: E03.9

## 2018-02-26 HISTORY — PX: KNEE ARTHROSCOPY WITH MEDIAL MENISECTOMY: SHX5651

## 2018-02-26 SURGERY — ARTHROSCOPY, KNEE, WITH MEDIAL MENISCECTOMY
Anesthesia: General | Site: Knee | Laterality: Left

## 2018-02-26 MED ORDER — DEXAMETHASONE SODIUM PHOSPHATE 10 MG/ML IJ SOLN
INTRAMUSCULAR | Status: AC
  Filled 2018-02-26: qty 1

## 2018-02-26 MED ORDER — ONDANSETRON HCL 4 MG PO TABS: 4.0000 mg | ORAL_TABLET | Freq: Three times a day (TID) | ORAL | 0 refills | Status: DC | PRN

## 2018-02-26 MED ORDER — ONDANSETRON HCL 4 MG/2ML IJ SOLN
INTRAMUSCULAR | Status: AC
  Filled 2018-02-26: qty 2

## 2018-02-26 MED ORDER — FENTANYL CITRATE (PF) 100 MCG/2ML IJ SOLN
INTRAMUSCULAR | Status: AC
  Filled 2018-02-26: qty 2

## 2018-02-26 MED ORDER — CLINDAMYCIN PHOSPHATE 900 MG/50ML IV SOLN
INTRAVENOUS | Status: DC | PRN
  Administered 2018-02-26: 900 mg via INTRAVENOUS

## 2018-02-26 MED ORDER — PROPOFOL 10 MG/ML IV BOLUS
INTRAVENOUS | Status: DC | PRN
  Administered 2018-02-26 (×2): 200 mg via INTRAVENOUS

## 2018-02-26 MED ORDER — LACTATED RINGERS IV SOLN: INTRAVENOUS | Status: DC

## 2018-02-26 MED ORDER — PHENYLEPHRINE HCL 10 MG/ML IJ SOLN
INTRAMUSCULAR | Status: DC | PRN
  Administered 2018-02-26: 25 ug/min via INTRAVENOUS

## 2018-02-26 MED ORDER — CLINDAMYCIN PHOSPHATE 900 MG/50ML IV SOLN: 900.0000 mg | INTRAVENOUS | Status: DC

## 2018-02-26 MED ORDER — HYDROCODONE-ACETAMINOPHEN 5-325 MG PO TABS: 1.0000 | ORAL_TABLET | Freq: Four times a day (QID) | ORAL | 0 refills | Status: DC | PRN

## 2018-02-26 MED ORDER — DEXAMETHASONE SODIUM PHOSPHATE 4 MG/ML IJ SOLN
INTRAMUSCULAR | Status: DC | PRN
  Administered 2018-02-26: 10 mg via INTRAVENOUS

## 2018-02-26 MED ORDER — SUCCINYLCHOLINE CHLORIDE 200 MG/10ML IV SOSY
PREFILLED_SYRINGE | INTRAVENOUS | Status: AC
  Filled 2018-02-26: qty 10

## 2018-02-26 MED ORDER — SODIUM CHLORIDE 0.9 % IJ SOLN
INTRAMUSCULAR | Status: AC
  Filled 2018-02-26: qty 10

## 2018-02-26 MED ORDER — CHLORHEXIDINE GLUCONATE 4 % EX LIQD: 60.0000 mL | Freq: Once | CUTANEOUS | Status: DC

## 2018-02-26 MED ORDER — PROPOFOL 10 MG/ML IV BOLUS
INTRAVENOUS | Status: AC
  Filled 2018-02-26: qty 20

## 2018-02-26 MED ORDER — FENTANYL CITRATE (PF) 100 MCG/2ML IJ SOLN: 25.0000 ug | INTRAMUSCULAR | Status: DC | PRN

## 2018-02-26 MED ORDER — CLINDAMYCIN PHOSPHATE 900 MG/50ML IV SOLN
INTRAVENOUS | Status: AC
  Filled 2018-02-26: qty 50

## 2018-02-26 MED ORDER — LIDOCAINE HCL (CARDIAC) PF 100 MG/5ML IV SOSY
PREFILLED_SYRINGE | INTRAVENOUS | Status: AC
  Filled 2018-02-26: qty 5

## 2018-02-26 MED ORDER — PHENYLEPHRINE HCL 10 MG/ML IJ SOLN
INTRAMUSCULAR | Status: AC
  Filled 2018-02-26: qty 1

## 2018-02-26 MED ORDER — FENTANYL CITRATE (PF) 100 MCG/2ML IJ SOLN: 50.0000 ug | INTRAMUSCULAR | Status: DC | PRN

## 2018-02-26 MED ORDER — LACTATED RINGERS IV SOLN
INTRAVENOUS | Status: DC
  Administered 2018-02-26 (×3): via INTRAVENOUS

## 2018-02-26 MED ORDER — PHENYLEPHRINE HCL 10 MG/ML IJ SOLN
INTRAMUSCULAR | Status: DC | PRN
  Administered 2018-02-26 (×4): 80 ug via INTRAVENOUS
  Administered 2018-02-26: 160 ug via INTRAVENOUS
  Administered 2018-02-26: 80 ug via INTRAVENOUS

## 2018-02-26 MED ORDER — MEPERIDINE HCL 25 MG/ML IJ SOLN: 6.2500 mg | INTRAMUSCULAR | Status: DC | PRN

## 2018-02-26 MED ORDER — LIDOCAINE HCL (CARDIAC) PF 100 MG/5ML IV SOSY
PREFILLED_SYRINGE | INTRAVENOUS | Status: DC | PRN
  Administered 2018-02-26: 30 mg via INTRAVENOUS

## 2018-02-26 MED ORDER — BUPIVACAINE HCL (PF) 0.25 % IJ SOLN
INTRAMUSCULAR | Status: AC
  Filled 2018-02-26: qty 30

## 2018-02-26 MED ORDER — SCOPOLAMINE 1 MG/3DAYS TD PT72: 1.0000 | MEDICATED_PATCH | Freq: Once | TRANSDERMAL | Status: DC | PRN

## 2018-02-26 MED ORDER — PHENYLEPHRINE 40 MCG/ML (10ML) SYRINGE FOR IV PUSH (FOR BLOOD PRESSURE SUPPORT)
PREFILLED_SYRINGE | INTRAVENOUS | Status: AC
  Filled 2018-02-26: qty 10

## 2018-02-26 MED ORDER — MIDAZOLAM HCL 2 MG/2ML IJ SOLN: 1.0000 mg | INTRAMUSCULAR | Status: DC | PRN

## 2018-02-26 MED ORDER — FENTANYL CITRATE (PF) 100 MCG/2ML IJ SOLN
INTRAMUSCULAR | Status: DC | PRN
  Administered 2018-02-26: 25 ug via INTRAVENOUS
  Administered 2018-02-26: 100 ug via INTRAVENOUS

## 2018-02-26 MED ORDER — BUPIVACAINE HCL (PF) 0.25 % IJ SOLN
INTRAMUSCULAR | Status: DC | PRN
  Administered 2018-02-26: 20 mL

## 2018-02-26 MED ORDER — SODIUM CHLORIDE 0.9 % IR SOLN
Status: DC | PRN
  Administered 2018-02-26: 3000 mL

## 2018-02-26 MED ORDER — EPHEDRINE SULFATE 50 MG/ML IJ SOLN
INTRAMUSCULAR | Status: AC
  Filled 2018-02-26: qty 1

## 2018-02-26 SURGICAL SUPPLY — 43 items
BANDAGE ACE 6X5 VEL STRL LF (GAUZE/BANDAGES/DRESSINGS) ×3 IMPLANT
BANDAGE ESMARK 6X9 LF (GAUZE/BANDAGES/DRESSINGS) IMPLANT
BLADE 4.2CUDA (BLADE) ×3 IMPLANT
BLADE CUDA GRT WHITE 3.5 (BLADE) IMPLANT
BLADE CUDA SHAVER 3.5 (BLADE) IMPLANT
BLADE CUTTER GATOR 3.5 (BLADE) IMPLANT
BLADE GREAT WHITE 4.2 (BLADE) ×2 IMPLANT
BLADE GREAT WHITE 4.2MM (BLADE) ×1
BNDG ESMARK 6X9 LF (GAUZE/BANDAGES/DRESSINGS)
CUFF TOURNIQUET SINGLE 34IN LL (TOURNIQUET CUFF) ×3 IMPLANT
DRAPE ARTHROSCOPY W/POUCH 90 (DRAPES) ×3 IMPLANT
DRAPE IMP U-DRAPE 54X76 (DRAPES) ×3 IMPLANT
DRAPE U-SHAPE 47X51 STRL (DRAPES) ×3 IMPLANT
DURAPREP 26ML APPLICATOR (WOUND CARE) ×3 IMPLANT
ELECT MENISCUS 165MM 90D (ELECTRODE) IMPLANT
ELECT REM PT RETURN 9FT ADLT (ELECTROSURGICAL)
ELECTRODE REM PT RTRN 9FT ADLT (ELECTROSURGICAL) IMPLANT
GAUZE SPONGE 4X4 12PLY STRL (GAUZE/BANDAGES/DRESSINGS) ×3 IMPLANT
GAUZE XEROFORM 1X8 LF (GAUZE/BANDAGES/DRESSINGS) ×3 IMPLANT
GLOVE BIOGEL PI IND STRL 7.0 (GLOVE) ×3 IMPLANT
GLOVE BIOGEL PI INDICATOR 7.0 (GLOVE) ×6
GLOVE ECLIPSE 7.0 STRL STRAW (GLOVE) ×3 IMPLANT
GLOVE SKINSENSE NS SZ7.5 (GLOVE) ×2
GLOVE SKINSENSE STRL SZ7.5 (GLOVE) ×1 IMPLANT
GLOVE SURG SYN 7.5  E (GLOVE) ×2
GLOVE SURG SYN 7.5 E (GLOVE) ×1 IMPLANT
GOWN STRL REIN XL XLG (GOWN DISPOSABLE) ×3 IMPLANT
GOWN STRL REUS W/ TWL LRG LVL3 (GOWN DISPOSABLE) ×2 IMPLANT
GOWN STRL REUS W/ TWL XL LVL3 (GOWN DISPOSABLE) ×2 IMPLANT
GOWN STRL REUS W/TWL LRG LVL3 (GOWN DISPOSABLE) ×4
GOWN STRL REUS W/TWL XL LVL3 (GOWN DISPOSABLE) ×4
KNEE WRAP E Z 3 GEL PACK (MISCELLANEOUS) ×3 IMPLANT
MANIFOLD NEPTUNE II (INSTRUMENTS) ×3 IMPLANT
PACK ARTHROSCOPY DSU (CUSTOM PROCEDURE TRAY) ×3 IMPLANT
PACK BASIN DAY SURGERY FS (CUSTOM PROCEDURE TRAY) ×3 IMPLANT
PENCIL BUTTON HOLSTER BLD 10FT (ELECTRODE) IMPLANT
RESECTOR FULL RADIUS 4.2MM (BLADE) IMPLANT
SHAVER 4.2 MM LANZA 9391A (BLADE) ×3 IMPLANT
SUT ETHILON 3 0 PS 1 (SUTURE) ×3 IMPLANT
TOWEL GREEN STERILE FF (TOWEL DISPOSABLE) ×3 IMPLANT
TOWEL OR NON WOVEN STRL DISP B (DISPOSABLE) IMPLANT
TUBING ARTHRO INFLOW-ONLY STRL (TUBING) ×3 IMPLANT
WATER STERILE IRR 1000ML POUR (IV SOLUTION) ×3 IMPLANT

## 2018-02-26 NOTE — Anesthesia Postprocedure Evaluation (Signed)
Anesthesia Post Note  Patient: Ross Becker  Procedure(s) Performed: LEFT KNEE ARTHROSCOPY WITH PARTIAL MEDIAL MENISCECTOMY, SYNOVECTOMY (Left Knee)     Patient location during evaluation: PACU Anesthesia Type: General Level of consciousness: awake and alert Pain management: pain level controlled Vital Signs Assessment: post-procedure vital signs reviewed and stable Respiratory status: spontaneous breathing, nonlabored ventilation, respiratory function stable and patient connected to nasal cannula oxygen Cardiovascular status: blood pressure returned to baseline and stable Postop Assessment: no apparent nausea or vomiting Anesthetic complications: no    Last Vitals:  Vitals:   02/26/18 0712 02/26/18 0908  BP: 131/79 123/80  Pulse: 86 85  Resp: 18   Temp: 36.7 C 36.5 C  SpO2: 99% 100%    Last Pain:  Vitals:   02/26/18 0908  TempSrc:   PainSc: 4                  Ritvik Mczeal

## 2018-02-26 NOTE — Transfer of Care (Signed)
Immediate Anesthesia Transfer of Care Note  Patient: Ross Becker  Procedure(s) Performed: LEFT KNEE ARTHROSCOPY WITH PARTIAL MEDIAL MENISCECTOMY, SYNOVECTOMY (Left Knee)  Patient Location: PACU  Anesthesia Type:General  Level of Consciousness: awake and patient cooperative  Airway & Oxygen Therapy: Patient Spontanous Breathing and Patient connected to face mask oxygen  Post-op Assessment: Report given to RN and Post -op Vital signs reviewed and stable  Post vital signs: Reviewed and stable  Last Vitals:  Vitals Value Taken Time  BP    Temp    Pulse 86 02/26/2018  9:07 AM  Resp    SpO2 100 % 02/26/2018  9:07 AM  Vitals shown include unvalidated device data.  Last Pain:  Vitals:   02/26/18 0712  TempSrc: Oral  PainSc: 3       Patients Stated Pain Goal: 0 (93/11/21 6244)  Complications: No apparent anesthesia complications

## 2018-02-26 NOTE — Anesthesia Procedure Notes (Signed)
Procedure Name: LMA Insertion Date/Time: 02/26/2018 8:19 AM Performed by: Marrianne Mood, CRNA Pre-anesthesia Checklist: Patient identified, Emergency Drugs available, Suction available, Patient being monitored and Timeout performed Patient Re-evaluated:Patient Re-evaluated prior to induction Oxygen Delivery Method: Circle system utilized Preoxygenation: Pre-oxygenation with 100% oxygen Induction Type: IV induction Ventilation: Mask ventilation without difficulty LMA: LMA inserted LMA Size: 4.0 Number of attempts: 1 Airway Equipment and Method: Bite block Placement Confirmation: positive ETCO2 Tube secured with: Tape Dental Injury: Teeth and Oropharynx as per pre-operative assessment

## 2018-02-26 NOTE — Op Note (Signed)
   Surgery Date: 02/26/2018  Surgeon(s): Leandrew Koyanagi, MD  ASSIST: Madalyn Rob, [place]; necessary for the timely completion of procedure and due to complexity of procedure.  ANESTHESIA:  general  FLUIDS: Per anesthesia record.   ESTIMATED BLOOD LOSS: minimal  PREOPERATIVE DIAGNOSES:  1. Left knee medial meniscus tear 2. Left knee synovitis  POSTOPERATIVE DIAGNOSES:  same  PROCEDURES PERFORMED:  1. Left knee arthroscopy with major synovectomy 2. Left knee arthroscopy with arthroscopic partial medial meniscectomy 3. Left knee arthroscopy with arthroscopic chondroplasty medial femoral condyle and medial tibial plateau.  DESCRIPTION OF PROCEDURE: Mr. Batson is a 71 y.o.-year-old male with left knee medial meniscus tear. Plans are to proceed with partial medial meniscectomy and diagnostic arthroscopy with debridement as indicated. Full discussion held regarding risks benefits alternatives and complications related surgical intervention. Conservative care options reviewed. All questions answered.  The patient was identified in the preoperative holding area and the operative extremity was marked. The patient was brought to the operating room and transferred to operating table in a supine position. Satisfactory general anesthesia was induced by anesthesiology.    Standard anterolateral, anteromedial arthroscopy portals were obtained. The anteromedial portal was obtained with a spinal needle for localization under direct visualization with subsequent diagnostic findings.   We performed a major synovectomy in all 3 compartments using oscillating shaver.  After this was done we then addressed the medial compartment.  Using a probe we were able to identify a horizontal cleavage tear of the posterior horn of the medial meniscus.  The meniscal root was grossly intact.  The unstable torn portions were debrided back to a stable border using meniscus basket and oscillating shaver.  Once  this was done gentle chondroplasty of the medial compartment was performed.  The ACL was intact.  The lateral compartment was grossly unremarkable.  We then repositioned the arthroscope in the patellofemoral compartment.  Gentle chondroplasty was performed of the femoral trochlea.  Excess fluid was then removed from the knee joint.  Arthroscopy portals were closed with interrupted nylon sutures.  Sterile dressings were applied.  Patient tolerated procedure well had no major complications.  Suprapatellar pouch and gutters: moderate synovitis or debris. Patella chondral surface: Grade 2 Trochlear chondral surface: Grade 2 Patellofemoral tracking: slightly lateral Medial meniscus: horizontal cleavage tear of posterior horn.  Medial femoral condyle flexion bearing surface: Grade 2 Medial femoral condyle extension bearing surface: Grade 2 Medial tibial plateau: Grade 2 Anterior cruciate ligament:stable Posterior cruciate ligament:stable Lateral meniscus: normal.   Lateral femoral condyle flexion bearing surface: Grade 1 Lateral femoral condyle extension bearing surface: Grade 1 Lateral tibial plateau: Grade 1  DISPOSITION: The patient was awakened from general anesthetic, extubated, taken to the recovery room in medically stable condition, no apparent complications. The patient may be weightbearing as tolerated to the operative lower extremity.  Range of motion of right knee as tolerated.  Azucena Cecil, MD [place] 8:53 AM

## 2018-02-26 NOTE — H&P (Signed)
PREOPERATIVE H&P  Chief Complaint: left knee medial meniscal tear  HPI: Ross Becker is a 72 y.o. male who presents for surgical treatment of left knee medial meniscal tear.  He denies any changes in medical history.  Past Medical History:  Diagnosis Date  . Arthritis   . Diverticulitis   . Enlarged prostate   . GERD (gastroesophageal reflux disease)   . Gout   . Hypertension   . Hypothyroidism    Past Surgical History:  Procedure Laterality Date  . CHOLECYSTECTOMY    . HERNIA REPAIR     Social History   Socioeconomic History  . Marital status: Single    Spouse name: Not on file  . Number of children: Not on file  . Years of education: Not on file  . Highest education level: Not on file  Occupational History  . Not on file  Social Needs  . Financial resource strain: Not on file  . Food insecurity:    Worry: Not on file    Inability: Not on file  . Transportation needs:    Medical: Not on file    Non-medical: Not on file  Tobacco Use  . Smoking status: Never Smoker  . Smokeless tobacco: Never Used  Substance and Sexual Activity  . Alcohol use: Yes    Comment: occasionally  . Drug use: No  . Sexual activity: Not on file  Lifestyle  . Physical activity:    Days per week: Not on file    Minutes per session: Not on file  . Stress: Not on file  Relationships  . Social connections:    Talks on phone: Not on file    Gets together: Not on file    Attends religious service: Not on file    Active member of club or organization: Not on file    Attends meetings of clubs or organizations: Not on file    Relationship status: Not on file  Other Topics Concern  . Not on file  Social History Narrative  . Not on file   Family History  Problem Relation Age of Onset  . Brain cancer Mother   . Cancer Father    Allergies  Allergen Reactions  . Mushroom Extract Complex     Avoids due to gout  . Penicillins Other (See Comments)    "childhood allergy"    Prior to Admission medications   Medication Sig Start Date End Date Taking? Authorizing Provider  cholecalciferol (VITAMIN D) 1000 UNITS tablet Take 2,000 Units by mouth daily.    Yes [provider]  colchicine 0.6 MG tablet Take 0.6 mg by mouth daily as needed (for gout).    Yes [provider]  cyanocobalamin 1000 MCG tablet Take 1,000 mcg by mouth daily.    Yes [provider]  enalapril (VASOTEC) 20 MG tablet Take 10 mg by mouth daily.   Yes [provider]  levothyroxine (SYNTHROID, LEVOTHROID) 50 MCG tablet Take 50 mcg by mouth daily before breakfast.   Yes [provider]  loperamide (IMODIUM) 2 MG capsule Take 4 mg by mouth as needed for diarrhea or loose stools.   Yes [provider]  omeprazole (PRILOSEC) 20 MG capsule Take 40 mg by mouth daily.    Yes [provider]  terazosin (HYTRIN) 2 MG capsule Take 8 mg by mouth at bedtime.   Yes [provider]     Positive ROS: All other systems have been reviewed and were otherwise negative with  the exception of those mentioned in the HPI and as above.  Physical Exam: General: Alert, no acute distress Cardiovascular: No pedal edema Respiratory: No cyanosis, no use of accessory musculature GI: abdomen soft Skin: No lesions in the area of chief complaint Neurologic: Sensation intact distally Psychiatric: Patient is competent for consent with normal mood and affect Lymphatic: no lymphedema  MUSCULOSKELETAL: exam stable  Assessment: left knee medial meniscal tear  Plan: Plan for Procedure(s): LEFT KNEE ARTHROSCOPY WITH PARTIAL MEDIAL MENISCECTOMY, SYNOVECTOMY  The risks benefits and alternatives were discussed with the patient including but not limited to the risks of nonoperative treatment, versus surgical intervention including infection, bleeding, nerve injury,  blood clots, cardiopulmonary complications, morbidity, mortality, among others, and they were  willing to proceed.    Eduard Roux, MD   02/26/2018 8:10 AM'

## 2018-02-26 NOTE — Discharge Instructions (Signed)

## 2018-02-28 ENCOUNTER — Encounter (HOSPITAL_BASED_OUTPATIENT_CLINIC_OR_DEPARTMENT_OTHER): Payer: Self-pay | Admitting: Orthopaedic Surgery

## 2018-03-05 ENCOUNTER — Telehealth (INDEPENDENT_AMBULATORY_CARE_PROVIDER_SITE_OTHER): Payer: Self-pay

## 2018-03-05 NOTE — Telephone Encounter (Addendum)
Patient left voicemail and would to know if he needs any PT. Has a follow up appt next week. S/P 02/26/18- Left knee Arthroscopy.   Also does he have any restrictions? What should he do and not do?    CB [phone]

## 2018-03-05 NOTE — Telephone Encounter (Signed)
Tred to call patient no answer [facility]

## 2018-03-05 NOTE — Telephone Encounter (Signed)
We will determine if he needs PT at this first visit.  He does not have any restrictions per se.  He can increase activity as toleratd.

## 2018-03-07 ENCOUNTER — Encounter: Payer: Self-pay | Admitting: Internal Medicine

## 2018-03-07 ENCOUNTER — Ambulatory Visit (INDEPENDENT_AMBULATORY_CARE_PROVIDER_SITE_OTHER): Payer: Medicare Other | Admitting: Internal Medicine

## 2018-03-07 VITALS — BP 114/74 | HR 118 | Ht 72.0 in | Wt 186.0 lb

## 2018-03-07 DIAGNOSIS — J920 Pleural plaque with presence of asbestos: Secondary | ICD-10-CM

## 2018-03-07 DIAGNOSIS — I10 Essential (primary) hypertension: Secondary | ICD-10-CM

## 2018-03-07 NOTE — Patient Instructions (Addendum)
Asbestos pleural plaques are causing you no harm at all but  place you at a slight increase risk for mesothelioma and asbestosis   You need to have another scan in two years but sooner if you develop  a medical indication :  Pain with breathing, short of breath or persistent cough (vasotec)   Pulmonary follow up is as needed

## 2018-03-07 NOTE — Progress Notes (Signed)
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  Underwent routine screening for asbestosis and found to have pleural plaques  By CT chest 10/28/2017 so referred to pulmonary clinic 03/07/2018 by Dr Ross Becker     03/07/2018  f/u ov/Ross Becker re:  Chief Complaint  Patient presents with  . Pulmonary Consult    Referred by Dr. Georgiann Becker for eval of asbestosis. He was exposed to asbestos for at least 4 years while he was in the [employer].   Dyspnea:  Long walk, some hills tol fine as long as doesn't get in too big of hurry Cough: no   No obvious day to day or daytime variability or assoc excess/ purulent sputum or mucus plugs or hemoptysis or cp or chest tightness, subjective wheeze or overt sinus or hb symptoms.   Sleep: on side / flat, 1-2 pillows at most  without nocturnal  or early am exacerbation  of respiratory  c/o's or need for noct saba. Also denies any obvious fluctuation of symptoms with weather or environmental changes or other aggravating or alleviating factors except as outlined above   No unusual exposure hx or h/o childhood pna/ asthma or knowledge of premature birth.  Current Allergies, Complete Past Medical History, Past Surgical History, Family History, and Social History were reviewed in [employer] record.  ROS  The following are not active complaints unless bolded Hoarseness, sore throat, dysphagia, dental problems, itching, sneezing,  nasal congestion or discharge of excess mucus or purulent secretions, ear ache,   fever, chills, sweats, unintended wt loss or wt gain, classically pleuritic or  exertional cp,  orthopnea pnd or arm/hand swelling  or leg swelling, presyncope, palpitations, abdominal pain, anorexia, nausea, vomiting, diarrhea  or change in bowel habits or change in bladder habits, change in stools or change in urine, dysuria, hematuria,  rash, arthralgias, visual complaints, headache, numbness, weakness or ataxia or problems with walking or coordination,  change in mood or  memory.             Past Medical History:  Diagnosis Date  . Arthritis   . Diverticulitis   . Enlarged prostate   . GERD (gastroesophageal reflux disease)   . Gout   . Hypertension   . Hypothyroidism     Outpatient Medications Prior to Visit  Medication Sig Dispense Refill  . cholecalciferol (VITAMIN D) 1000 UNITS tablet Take 2,000 Units by mouth daily.     . colchicine 0.6 MG tablet Take 0.6 mg by mouth daily as needed (for gout).     . cyanocobalamin 1000 MCG tablet Take 1,000 mcg by mouth daily.     . enalapril (VASOTEC) 20 MG tablet Take 10 mg by mouth daily.    Marland Kitchen levothyroxine (SYNTHROID, LEVOTHROID) 50 MCG tablet Take 50 mcg by mouth daily before breakfast.    . loperamide (IMODIUM) 2 MG capsule Take 4 mg by mouth as needed for diarrhea or loose stools.    Marland Kitchen omeprazole (PRILOSEC) 20 MG capsule Take 40 mg by mouth daily.     Marland Kitchen terazosin (HYTRIN) 2 MG capsule Take  8 mg by mouth at bedtime.    Marland Kitchen HYDROcodone-acetaminophen (NORCO) 5-325 MG tablet Take 1-2 tablets by mouth every 6 (six) hours as needed. (Patient not taking: Reported on 03/07/2018) 30 tablet 0  . ondansetron (ZOFRAN) 4 MG tablet Take 1-2 tablets (4-8 mg total) by mouth every 8 (eight) hours as needed for nausea or vomiting. 40 tablet 0   No facility-administered medications prior to visit.               Objective:     BP 114/74 (BP Location: Left Arm, Cuff Size: Normal)   Pulse (!) 118   Ht 6' (1.829 m)   Wt 186 lb (84.4 kg)   SpO2 94%   BMI 25.23 kg/m   SpO2: 94 % RA     HEENT: nl dentition, turbinates  bilaterally, and oropharynx. Nl external ear canals without cough reflex   NECK :  without JVD/Nodes/TM/ nl carotid upstrokes bilaterally   LUNGS: no acc muscle use,  Nl contour chest which is clear to A and P bilaterally without cough on insp or exp maneuvers   CV:  RRR  no s3 or murmur or increase in P2, and no edema   ABD:  soft and nontender with nl inspiratory excursion in the supine position. No bruits or organomegaly appreciated, bowel sounds nl  MS:  Walk with limp/ ext warm without deformities, calf tenderness, cyanosis  - No clubbing No obvious joint restrictions s/p recent  L knee arthroscopy so L knee not manipulated but still somewhat puffy    SKIN: warm and dry with L knee post op changes/ mild ecchymosis extending to post knee  NEURO:  alert, approp, nl sensorium with  no motor or cerebellar deficits apparent.     I personally reviewed images and agree with radiology impression as follows:   Chest CT 10/29/17 with classic calcified pleural plaques no ild     Assessment   Asbestos-induced pleural plaque Reviewed natural hx of asbestos exp which he clearly experienced last in 1971 with resulting asymptomatic pleural plaques with risk of mesothelioma and ILD but no def guidelines on how often he needs repeat CT chest in asymptomatic state.  Since this is the first detection would rec 2 years, sooner if new symptoms (the fleeting R ant cp x years is not typical at all of pleurisy, much less asbestos plaques, and more typical of IBS as usually does not occur supine)   Discussed in detail all the  indications, usual  risks and alternatives  relative to the benefits with patient who agrees to proceed with conservative f/u as outlined     Total time devoted to counseling  > 50 % of initial 60 min office visit:  review case with pt/ discussion of options/alternatives/ personally creating written customized instructions  in presence of pt  then going over those specific   Instructions directly with the pt including how to use all of the meds but in particular covering each new medication in detail and the difference between the maintenance= "automatic" meds and the prns using an action plan format for the latter (If this problem/symptom => do that organization reading Left to right).  Please see AVS from this visit for a full list of these instructions which I personally wrote for this pt and  are unique to this visit.   Essential hypertension One of the symptoms he needs to be on the lookout for is chronic cough but since this particular acei has the highest  reported incidence of cough (up to 25 % in some series) would try on ARB x 6 week prior to pursuing w/u here.     Christinia Gully, MD 03/07/2018

## 2018-03-08 ENCOUNTER — Encounter: Payer: Self-pay | Admitting: Internal Medicine

## 2018-03-08 DIAGNOSIS — J920 Pleural plaque with presence of asbestos: Secondary | ICD-10-CM | POA: Insufficient documentation

## 2018-03-08 NOTE — Assessment & Plan Note (Signed)
Reviewed natural hx of asbestos exp which he clearly experienced last in 1971 with resulting asymptomatic pleural plaques with risk of mesothelioma and ILD but no def guidelines on how often he needs repeat CT chest in asymptomatic state.  Since this is the first detection would rec 2 years, sooner if new symptoms (the fleeting R ant cp x years is not typical at all of pleurisy, much less asbestos plaques, and more typical of IBS as usually does not occur supine)   Discussed in detail all the  indications, usual  risks and alternatives  relative to the benefits with patient who agrees to proceed with conservative f/u as outlined     Total time devoted to counseling  > 50 % of initial 60 min office visit:  review case with pt/ discussion of options/alternatives/ personally creating written customized instructions  in presence of pt  then going over those specific  Instructions directly with the pt including how to use all of the meds but in particular covering each new medication in detail and the difference between the maintenance= "automatic" meds and the prns using an action plan format for the latter (If this problem/symptom => do that organization reading Left to right).  Please see AVS from this visit for a full list of these instructions which I personally wrote for this pt and  are unique to this visit.

## 2018-03-08 NOTE — Assessment & Plan Note (Signed)
One of the symptoms he needs to be on the lookout for is chronic cough but since this particular acei has the highest reported incidence of cough (up to 25 % in some series) would try on ARB x 6 week prior to pursuing w/u here.

## 2018-03-10 ENCOUNTER — Telehealth: Payer: Self-pay | Admitting: Internal Medicine

## 2018-03-10 NOTE — Telephone Encounter (Signed)
Lyla Glassing, nurse navigator. she is requesting the notes from [facility]. This has been printed and faxed.

## 2018-03-18 ENCOUNTER — Ambulatory Visit (INDEPENDENT_AMBULATORY_CARE_PROVIDER_SITE_OTHER): Payer: Medicare Other | Admitting: Orthopaedic Surgery

## 2018-03-18 DIAGNOSIS — Z9889 Other specified postprocedural states: Secondary | ICD-10-CM

## 2018-03-18 NOTE — Progress Notes (Signed)
   Post-Op Visit Note   Patient: Ross Becker           Date of Birth: 09-13-1945           MRN: [id number] Visit Date: 03/18/2018 PCP: [place]:  Chief Complaint:  Chief Complaint  Patient presents with  . Left Knee - Follow-up    02/26/18 left knee arthroscopy, PMM, synovectomy   Visit Diagnoses:  1. S/P arthroscopy of left knee     Plan: Patient is a pleasant 72 year old gentleman who presents to our clinic today 20 days status post left knee arthroscopic synovectomy, debridement medial meniscus, date of surgery 02/26/2018.  It was noted during operative intervention he had grade 2 changes medial and patellofemoral compartments.  He still has moderate pain to the medial aspect.  Worse with ambulation.  He has been taking Tylenol as needed.  No fevers, chills or any other systemic symptoms.  He does mention slight tenderness to the left calf.  No chest pain and no shortness of breath.  Examination of the left lower extremity reveals well-healed surgical portals without evidence of infection.  Calf is soft, nonerythematous and not swollen.  Minimal tenderness.  No effusion to the knee.  Range of motion 0 to 120 degrees.  Today, sutures were removed.  The patient was given a handout for a home exercise program for the knee.  He will follow-up with [place] in 4 weeks time for recheck.  Should his calf pain worsen, he will call and let us know.  Follow-Up Instructions: Return in about 1 month (around 04/15/2018).   Orders:  No orders of the defined types were placed in this encounter.  No orders of the defined types were placed in this encounter.   Imaging: No results found.  PMFS History: Patient Active Problem List   Diagnosis Date Noted  . S/P arthroscopy of left knee 03/18/2018  . Asbestos-induced pleural plaque 03/08/2018  . Other meniscus derangements, posterior horn of medial meniscus, left knee   . Synovitis of knee   . Unilateral primary  osteoarthritis, left knee 11/25/2017  . Complex tear of medial meniscus of left knee 10/01/2017  . Chest pain 12/09/2016  . Unstable angina ([place])   . Weakness of right foot 10/07/2014  . Right hemiplegia ([place]) 10/06/2014  . TIA (transient ischemic attack) 10/06/2014  . Essential hypertension 10/06/2014   Past Medical History:  Diagnosis Date  . Arthritis   . Diverticulitis   . Enlarged prostate   . GERD (gastroesophageal reflux disease)   . Gout   . Hypertension   . Hypothyroidism     Family History  Problem Relation Age of Onset  . Brain cancer Mother   . Cancer Father     Past Surgical History:  Procedure Laterality Date  . CHOLECYSTECTOMY    . HERNIA REPAIR    . KNEE ARTHROSCOPY WITH MEDIAL MENISECTOMY Left 02/26/2018   Procedure: LEFT KNEE ARTHROSCOPY WITH PARTIAL MEDIAL MENISCECTOMY, SYNOVECTOMY;  Surgeon: Leandrew Koyanagi, MD;  Location: [facility];  Service: Orthopedics;  Laterality: Left;   Social History   Occupational History  . Not on file  Tobacco Use  . Smoking status: Never Smoker  . Smokeless tobacco: Never Used  Substance and Sexual Activity  . Alcohol use: Yes    Comment: occasionally  . Drug use: No  . Sexual activity: Not on file

## 2018-04-11 IMAGING — CR DG CHEST 2V
2 series · 2 of 2 positions shown · non-contrast
Comparison: 01/07/2016

CLINICAL DATA: Mid LEFT chest pain.

EXAM:
CHEST  2 VIEW

[chest pa]
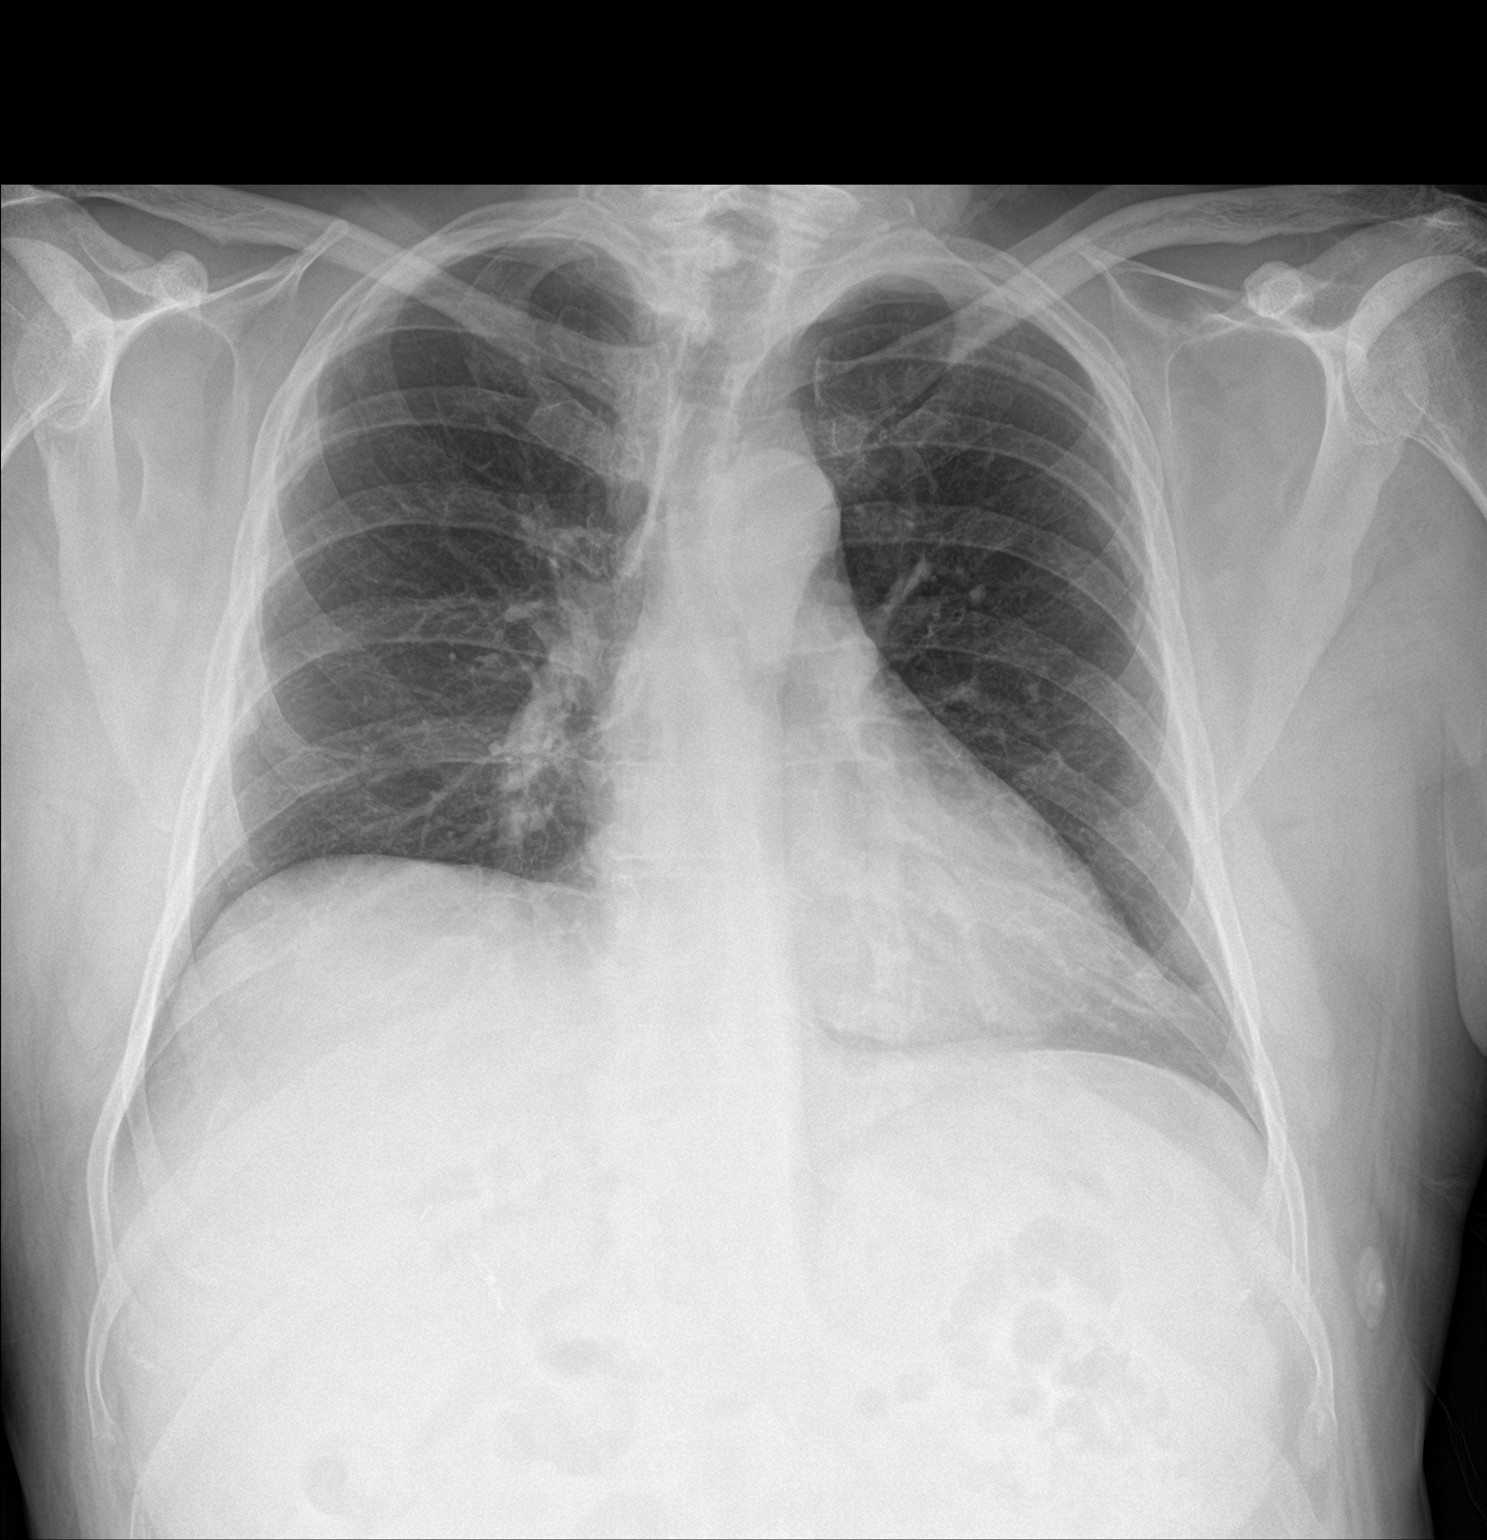

[chest lat]
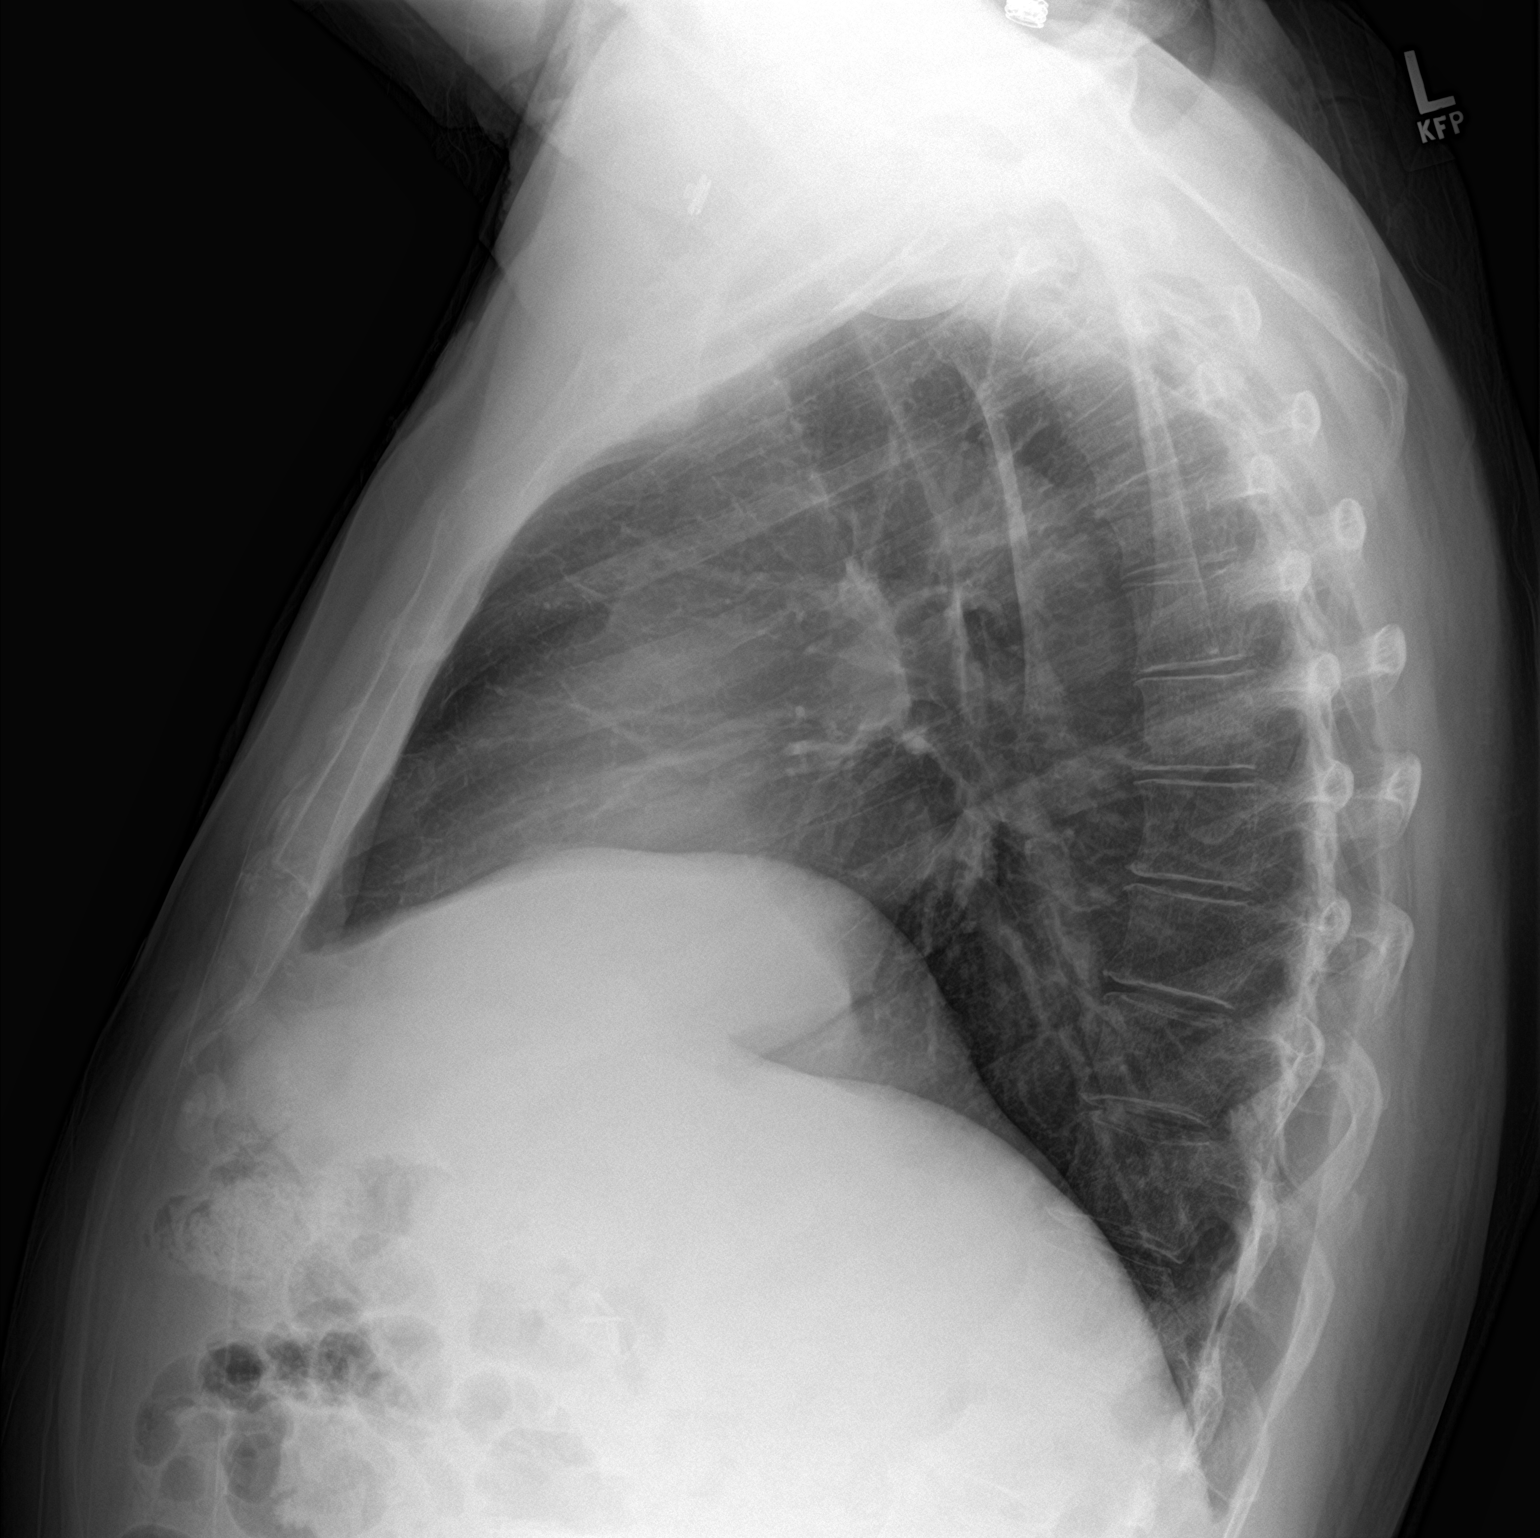

[2 of 2 positions shown; findings below may reference images not displayed]

FINDINGS: Normal mediastinum and cardiac silhouette. Normal pulmonary
vasculature. No evidence of effusion, infiltrate, or pneumothorax.
Mild pleural thickening along the lateral chest walls. No change
from prior. No acute bony abnormality.
IMPRESSION: No acute cardiopulmonary process.

Bilateral pleural plaques..

## 2018-04-15 ENCOUNTER — Ambulatory Visit (INDEPENDENT_AMBULATORY_CARE_PROVIDER_SITE_OTHER): Payer: Medicare Other | Admitting: Orthopaedic Surgery

## 2018-04-15 ENCOUNTER — Encounter (INDEPENDENT_AMBULATORY_CARE_PROVIDER_SITE_OTHER): Payer: Self-pay | Admitting: Orthopaedic Surgery

## 2018-04-15 DIAGNOSIS — S83232A Complex tear of medial meniscus, current injury, left knee, initial encounter: Secondary | ICD-10-CM

## 2018-04-15 DIAGNOSIS — Z9889 Other specified postprocedural states: Secondary | ICD-10-CM

## 2018-04-15 DIAGNOSIS — M1712 Unilateral primary osteoarthritis, left knee: Secondary | ICD-10-CM | POA: Diagnosis not present

## 2018-04-15 NOTE — Progress Notes (Signed)
Ross Becker is approximately 7 weeks status post left knee arthroscopy and partial medial meniscectomy and chondroplasty.  He is overall doing well.  He does endorse some anterior knee pain that radiates up into the thigh and some toothache pain.  Denies any mechanical symptoms.  Overall he is doing well.  He has been doing the exercises surgical scars are fully healed.  No signs of infection.  No joint effusion.  Excellent range of motion.  At this point I would like him to continue to work on home exercises for quad strengthening.  We did discuss Visco supplementation which he is interested in.  We will submit his insurance for approval.  We will see him back for that injection.

## 2019-03-26 ENCOUNTER — Other Ambulatory Visit: Payer: Self-pay

## 2019-07-22 ENCOUNTER — Other Ambulatory Visit: Payer: Self-pay

## 2019-07-22 ENCOUNTER — Emergency Department (HOSPITAL_COMMUNITY)
Admission: EM | Admit: 2019-07-22 | Discharge: 2019-07-23 | Disposition: A | Discharge status: Home / Self Care | Payer: No Typology Code available for payment source | Attending: Emergency Medicine | Admitting: Emergency Medicine

## 2019-07-22 ENCOUNTER — Encounter (HOSPITAL_COMMUNITY): Payer: Self-pay

## 2019-07-22 ENCOUNTER — Emergency Department (HOSPITAL_COMMUNITY): Payer: No Typology Code available for payment source

## 2019-07-22 DIAGNOSIS — J069 Acute upper respiratory infection, unspecified: Secondary | ICD-10-CM | POA: Diagnosis not present

## 2019-07-22 DIAGNOSIS — Z20828 Contact with and (suspected) exposure to other viral communicable diseases: Secondary | ICD-10-CM | POA: Diagnosis not present

## 2019-07-22 DIAGNOSIS — R0602 Shortness of breath: Secondary | ICD-10-CM

## 2019-07-22 DIAGNOSIS — E039 Hypothyroidism, unspecified: Secondary | ICD-10-CM | POA: Diagnosis not present

## 2019-07-22 DIAGNOSIS — I10 Essential (primary) hypertension: Secondary | ICD-10-CM | POA: Insufficient documentation

## 2019-07-22 DIAGNOSIS — B9789 Other viral agents as the cause of diseases classified elsewhere: Secondary | ICD-10-CM | POA: Diagnosis not present

## 2019-07-22 LAB — FERRITIN: Ferritin: 176 ng/mL (ref 24–336)

## 2019-07-22 LAB — URINALYSIS, ROUTINE W REFLEX MICROSCOPIC
Bilirubin Urine: NEGATIVE
Glucose, UA: NEGATIVE mg/dL
Hgb urine dipstick: NEGATIVE
Ketones, ur: NEGATIVE mg/dL
Leukocytes,Ua: NEGATIVE
Nitrite: NEGATIVE
Protein, ur: NEGATIVE mg/dL
Specific Gravity, Urine: 1.005 (ref 1.005–1.030)
pH: 5 (ref 5.0–8.0)

## 2019-07-22 LAB — LACTIC ACID, PLASMA: Lactic Acid, Venous: 1.7 mmol/L (ref 0.5–1.9)

## 2019-07-22 LAB — FIBRINOGEN: Fibrinogen: 341 mg/dL (ref 210–475)

## 2019-07-22 LAB — CBC
HCT: 43 % (ref 39.0–52.0)
Hemoglobin: 15.3 g/dL (ref 13.0–17.0)
MCH: 35 pg — ABNORMAL HIGH (ref 26.0–34.0)
MCHC: 35.6 g/dL (ref 30.0–36.0)
MCV: 98.4 fL (ref 80.0–100.0)
Platelets: 218 10*3/uL (ref 150–400)
RBC: 4.37 MIL/uL (ref 4.22–5.81)
RDW: 11.2 % — ABNORMAL LOW (ref 11.5–15.5)
WBC: 8.2 10*3/uL (ref 4.0–10.5)
nRBC: 0 % (ref 0.0–0.2)

## 2019-07-22 LAB — POC SARS CORONAVIRUS 2 AG -  ED: SARS Coronavirus 2 Ag: NEGATIVE

## 2019-07-22 LAB — BASIC METABOLIC PANEL
Anion gap: 12 (ref 5–15)
BUN: 11 mg/dL (ref 8–23)
CO2: 22 mmol/L (ref 22–32)
Calcium: 9.3 mg/dL (ref 8.9–10.3)
Chloride: 100 mmol/L (ref 98–111)
Creatinine, Ser: 1.05 mg/dL (ref 0.61–1.24)
GFR calc Af Amer: >60 mL/min (ref >60)
GFR calc non Af Amer: >60 mL/min (ref >60)
Glucose, Bld: 101 mg/dL — ABNORMAL HIGH (ref 70–99)
Potassium: 4.7 mmol/L (ref 3.5–5.1)
Sodium: 134 mmol/L — ABNORMAL LOW (ref 135–145)

## 2019-07-22 LAB — D-DIMER, QUANTITATIVE: D-Dimer, Quant: 0.37 ug/mL-FEU (ref 0.00–0.50)

## 2019-07-22 LAB — BRAIN NATRIURETIC PEPTIDE: B Natriuretic Peptide: 17.3 pg/mL (ref 0.0–100.0)

## 2019-07-22 LAB — LACTATE DEHYDROGENASE: LDH: 177 U/L (ref 98–192)

## 2019-07-22 LAB — TRIGLYCERIDES: Triglycerides: 153 mg/dL — ABNORMAL HIGH (ref <150)

## 2019-07-22 LAB — TROPONIN I (HIGH SENSITIVITY): Troponin I (High Sensitivity): 4 ng/L (ref <18)

## 2019-07-22 LAB — PROCALCITONIN: Procalcitonin: <0.1 ng/mL

## 2019-07-22 LAB — C-REACTIVE PROTEIN: CRP: 0.9 mg/dL (ref <1.0)

## 2019-07-22 MED ORDER — ALBUTEROL SULFATE HFA 108 (90 BASE) MCG/ACT IN AERS
2.0000 | INHALATION_SPRAY | Freq: Once | RESPIRATORY_TRACT | Status: AC
  Administered 2019-07-22: 2 via RESPIRATORY_TRACT
  Filled 2019-07-22: qty 6.7

## 2019-07-22 MED ORDER — DOXYCYCLINE HYCLATE 100 MG PO CAPS: 100.0000 mg | ORAL_CAPSULE | Freq: Two times a day (BID) | ORAL | 0 refills | Status: AC

## 2019-07-22 MED ORDER — ALBUTEROL SULFATE HFA 108 (90 BASE) MCG/ACT IN AERS: 1.0000 | INHALATION_SPRAY | Freq: Four times a day (QID) | RESPIRATORY_TRACT | 0 refills | Status: AC | PRN

## 2019-07-22 NOTE — Discharge Instructions (Addendum)
You were seen in the emergency department today with congestion and shortness of breath.  We are testing you for COVID-19.  Please remain in isolation until results come back.  You can check the results of your test in the MyChart app.  Call your primary care doctor on Friday for follow-up.  Return to the emergency department immediately if your symptoms suddenly worsen.

## 2019-07-22 NOTE — ED Notes (Signed)
Patient ambulated in room with a steady gait. Showed no signs of distress and when asked how he felt pt stated that he felt fine. O2 Sats were 95% room air. Dr. Laverta Baltimore has been notified.

## 2019-07-22 NOTE — ED Triage Notes (Signed)
Pt reports congestion, shortness of breath and increased wheezing for the past 2 days. Slight headache and nausea this morning but has now resolved. Pt able to speak in complete sentences. No audible wheezing noted. Pt a.o.

## 2019-07-22 NOTE — ED Provider Notes (Signed)
Emergency Department Provider Note   I have reviewed the triage vital signs and the nursing notes.   HISTORY  Chief Complaint Shortness of Breath and Nasal Congestion   HPI Ross Becker is a 73 y.o. male with PMH of asbestosis, GERD, and HTN presents to the emergency department with congestion, shortness of breath, wheezing over the past 4 days.  He reports worsening symptoms when lying down especially flat.  Has been sleeping somewhat propped up in bed. Denies any PND. No LE edema noticed. Denies fever.  Patient states he had a similar episode several months ago and took antibiotic with some albuterol and symptoms improved.  He is experiencing specific chest pain or heart palpitations.  No sick contacts. No radiation of symptoms or modifying factors.   Past Medical History:  Diagnosis Date  . Arthritis   . Diverticulitis   . Enlarged prostate   . GERD (gastroesophageal reflux disease)   . Gout   . Hypertension   . Hypothyroidism     Patient Active Problem List   Diagnosis Date Noted  . S/P arthroscopy of left knee 03/18/2018  . Asbestos-induced pleural plaque 03/08/2018  . Other meniscus derangements, posterior horn of medial meniscus, left knee   . Synovitis of knee   . Unilateral primary osteoarthritis, left knee 11/25/2017  . Complex tear of medial meniscus of left knee 10/01/2017  . Chest pain 12/09/2016  . Unstable angina ([place])   . Weakness of right foot 10/07/2014  . Right hemiplegia ([place]) 10/06/2014  . TIA (transient ischemic attack) 10/06/2014  . Essential hypertension 10/06/2014    Past Surgical History:  Procedure Laterality Date  . CHOLECYSTECTOMY    . HERNIA REPAIR    . KNEE ARTHROSCOPY WITH MEDIAL MENISECTOMY Left 02/26/2018   Procedure: LEFT KNEE ARTHROSCOPY WITH PARTIAL MEDIAL MENISCECTOMY, SYNOVECTOMY;  Surgeon: Leandrew Koyanagi, MD;  Location: [facility];  Service: Orthopedics;  Laterality: Left;    Allergies Mushroom extract  complex and Penicillins  Family History  Problem Relation Age of Onset  . Brain cancer Mother   . Cancer Father     Social History Social History   Tobacco Use  . Smoking status: Never Smoker  . Smokeless tobacco: Never Used  Substance Use Topics  . Alcohol use: Yes    Comment: occasionally  . Drug use: No    Review of Systems  Constitutional: No fever/chills Eyes: No visual changes. ENT: No sore throat. Positive congestion.  Cardiovascular: Denies chest pain. Respiratory: Positive shortness of breath and cough.  Gastrointestinal: No abdominal pain.  No nausea, no vomiting. Chronic intermittent diarrhea.  No constipation. Genitourinary: Negative for dysuria. Musculoskeletal: Negative for back pain. Skin: Negative for rash. Neurological: Negative for focal weakness or numbness. Positive HA (resolved).   10-point ROS otherwise negative.  ____________________________________________   PHYSICAL EXAM:  VITAL SIGNS: ED Triage Vitals  Enc Vitals Group     BP 07/22/19 1712 (!) 159/99     Pulse Rate 07/22/19 1712 (!) 122     Resp 07/22/19 1712 (!) 21     Temp 07/22/19 1712 98.2 F (36.8 C)     Temp Source 07/22/19 1712 Oral     SpO2 07/22/19 1712 94 %     Weight 07/22/19 1716 185 lb (83.9 kg)     Height 07/22/19 1716 5\' 11"  (1.803 m)   Constitutional: Alert and oriented. Well appearing and in no acute distress. Eyes: Conjunctivae are normal.  Head: Atraumatic. Nose: No congestion/rhinnorhea. Mouth/Throat: Mucous  membranes are moist.  Neck: No stridor.   Cardiovascular: Normal rate, regular rhythm. Good peripheral circulation. Grossly normal heart sounds.   Respiratory: Normal respiratory effort.  No retractions. Lungs CTAB. No wheezing.  Gastrointestinal: Soft and nontender. No distention.  Musculoskeletal: No lower extremity tenderness nor edema. No gross deformities of extremities. Neurologic:  Normal speech and language. No gross focal neurologic deficits are  appreciated.  Skin:  Skin is warm, dry and intact. No rash noted.   ____________________________________________   LABS (all labs ordered are listed, but only abnormal results are displayed)  Labs Reviewed  BASIC METABOLIC PANEL - Abnormal; Notable for the following components:      Result Value   Sodium 134 (*)    Glucose, Bld 101 (*)    All other components within normal limits  CBC - Abnormal; Notable for the following components:   MCH 35.0 (*)    RDW 11.2 (*)    All other components within normal limits  TRIGLYCERIDES - Abnormal; Notable for the following components:   Triglycerides 153 (*)    All other components within normal limits  URINALYSIS, ROUTINE W REFLEX MICROSCOPIC - Abnormal; Notable for the following components:   Color, Urine STRAW (*)    All other components within normal limits  CULTURE, BLOOD (ROUTINE X 2)  CULTURE, BLOOD (ROUTINE X 2)  LACTIC ACID, PLASMA  D-DIMER, QUANTITATIVE (NOT AT [facility])  PROCALCITONIN  LACTATE DEHYDROGENASE  FERRITIN  FIBRINOGEN  C-REACTIVE PROTEIN  BRAIN NATRIURETIC PEPTIDE  POC SARS CORONAVIRUS 2 AG -  ED  TROPONIN I (HIGH SENSITIVITY)  TROPONIN I (HIGH SENSITIVITY)   ____________________________________________  EKG   EKG Interpretation  Date/Time:  Wednesday July 22 2019 21:20:10 EST Ventricular Rate:  97 PR Interval:  152 QRS Duration: 83 QT Interval:  330 QTC Calculation: 420 R Axis:   18 Text Interpretation: Sinus rhythm Low voltage, precordial leads tachycardia improved compared to earlier in the day Confirmed by Sherwood Gambler [phone]) on 07/23/2019 10:09:51 AM       ____________________________________________  RADIOLOGY  Dg Chest Portable 1 View  Result Date: 07/22/2019 CLINICAL DATA:  Congestion, shortness of breath, and wheezing for the past 2 days. EXAM: PORTABLE CHEST 1 VIEW COMPARISON:  12/30/2016 FINDINGS: Mildly low lung volumes. Cardiac and mediastinal margins appear normal. There  bilateral pleural calcifications causing some vague densities along the right lateral hemithorax. Left-sided pleural calcifications are also present. Appearance suggests remote asbestos exposure. The lungs appear otherwise clear. No blunting of the costophrenic angles. IMPRESSION: 1. No active cardiopulmonary disease is radiographically apparent. 2. Bilateral pleural calcifications suggesting remote asbestos exposure. Electronically Signed   By: Van Clines M.D.   On: 07/22/2019 19:45    ____________________________________________   PROCEDURES  Procedure(s) performed:   Procedures  None ____________________________________________   INITIAL IMPRESSION / ASSESSMENT AND PLAN / ED COURSE  Pertinent labs & imaging results that were available during my care of the patient were reviewed by me and considered in my medical decision making (see chart for details).   Patient with overall well appearance, initial evaluation.  Describes congestion shortness of breath mild constitutional symptoms.  Factious etiologies such as viral process is on the differential including COVID-19.  Some of the patient's shortness of breath history is consistent with CHF.  He has not clinically volume overloaded.  No pulmonary edema on chest x-ray.  He is not hypoxic.  He was fairly tachycardic in triage at 122.  I have added preadmit Covid orders and will follow  after additional lab work +/- CTA chest to evaluate for PE pending d-dimer.   D-dimer negative. COVID negative and inflammatory markers are normal. Patient ambulated with no SOB or hypoxemia. Plan for abx and albuterol with close PCP follow up. No steroid for now with concern for hyperglycemia and elevated BP at home. Discussed ED return precautions.  ____________________________________________  FINAL CLINICAL IMPRESSION(S) / ED DIAGNOSES  Final diagnoses:  Viral upper respiratory tract infection  Shortness of breath     MEDICATIONS GIVEN DURING  THIS VISIT:  Medications  albuterol (VENTOLIN HFA) 108 (90 Base) MCG/ACT inhaler 2 puff (2 puffs Inhalation Given 07/22/19 2139)     NEW OUTPATIENT MEDICATIONS STARTED DURING THIS VISIT:  Discharge Medication List as of 07/22/2019 11:21 PM    START taking these medications   Details  albuterol (VENTOLIN HFA) 108 (90 Base) MCG/ACT inhaler Inhale 1-2 puffs into the lungs every 6 (six) hours as needed for wheezing or shortness of breath., Starting Wed 07/22/2019, Print    doxycycline (VIBRAMYCIN) 100 MG capsule Take 1 capsule (100 mg total) by mouth 2 (two) times daily for 7 days., Starting Wed 07/22/2019, Until Wed 07/29/2019, Print        Note:  This document was prepared using Dragon voice recognition software and may include unintentional dictation errors.  Nanda Quinton, MD, [facility] Emergency Medicine    [place], [place], MD 07/24/19 [phone]

## 2019-07-27 LAB — CULTURE, BLOOD (ROUTINE X 2)
Culture: NO GROWTH
Culture: NO GROWTH
Special Requests: ADEQUATE
Special Requests: ADEQUATE

## 2019-07-29 ENCOUNTER — Telehealth: Payer: Self-pay | Admitting: General Practice

## 2019-07-29 NOTE — Telephone Encounter (Signed)
Negative COVID results given. Patient results "NOT Detected." Caller expressed understanding. ° °

## 2019-08-05 ENCOUNTER — Emergency Department (HOSPITAL_COMMUNITY): Payer: Medicare Other

## 2019-08-05 ENCOUNTER — Other Ambulatory Visit: Payer: Self-pay

## 2019-08-05 ENCOUNTER — Emergency Department (HOSPITAL_COMMUNITY)
Admission: EM | Admit: 2019-08-05 | Discharge: 2019-08-05 | Disposition: A | Discharge status: Home / Self Care | Payer: Medicare Other | Attending: Emergency Medicine | Admitting: Emergency Medicine

## 2019-08-05 ENCOUNTER — Encounter (HOSPITAL_COMMUNITY): Payer: Self-pay | Admitting: Emergency Medicine

## 2019-08-05 DIAGNOSIS — R0602 Shortness of breath: Secondary | ICD-10-CM | POA: Insufficient documentation

## 2019-08-05 DIAGNOSIS — E039 Hypothyroidism, unspecified: Secondary | ICD-10-CM | POA: Diagnosis not present

## 2019-08-05 DIAGNOSIS — Z20828 Contact with and (suspected) exposure to other viral communicable diseases: Secondary | ICD-10-CM | POA: Insufficient documentation

## 2019-08-05 DIAGNOSIS — R05 Cough: Secondary | ICD-10-CM

## 2019-08-05 DIAGNOSIS — J40 Bronchitis, not specified as acute or chronic: Secondary | ICD-10-CM | POA: Diagnosis not present

## 2019-08-05 DIAGNOSIS — Z77090 Contact with and (suspected) exposure to asbestos: Secondary | ICD-10-CM | POA: Diagnosis not present

## 2019-08-05 DIAGNOSIS — I10 Essential (primary) hypertension: Secondary | ICD-10-CM | POA: Diagnosis not present

## 2019-08-05 DIAGNOSIS — Z79899 Other long term (current) drug therapy: Secondary | ICD-10-CM | POA: Insufficient documentation

## 2019-08-05 DIAGNOSIS — R059 Cough, unspecified: Secondary | ICD-10-CM

## 2019-08-05 LAB — CBC
HCT: 42.2 % (ref 39.0–52.0)
Hemoglobin: 14.8 g/dL (ref 13.0–17.0)
MCH: 34.8 pg — ABNORMAL HIGH (ref 26.0–34.0)
MCHC: 35.1 g/dL (ref 30.0–36.0)
MCV: 99.3 fL (ref 80.0–100.0)
Platelets: 221 10*3/uL (ref 150–400)
RBC: 4.25 MIL/uL (ref 4.22–5.81)
RDW: 11.3 % — ABNORMAL LOW (ref 11.5–15.5)
WBC: 7.8 10*3/uL (ref 4.0–10.5)
nRBC: 0 % (ref 0.0–0.2)

## 2019-08-05 LAB — BASIC METABOLIC PANEL
Anion gap: 12 (ref 5–15)
BUN: 10 mg/dL (ref 8–23)
CO2: 24 mmol/L (ref 22–32)
Calcium: 9.2 mg/dL (ref 8.9–10.3)
Chloride: 101 mmol/L (ref 98–111)
Creatinine, Ser: 0.89 mg/dL (ref 0.61–1.24)
GFR calc Af Amer: >60 mL/min (ref >60)
GFR calc non Af Amer: >60 mL/min (ref >60)
Glucose, Bld: 76 mg/dL (ref 70–99)
Potassium: 4.5 mmol/L (ref 3.5–5.1)
Sodium: 137 mmol/L (ref 135–145)

## 2019-08-05 LAB — TROPONIN I (HIGH SENSITIVITY)
Troponin I (High Sensitivity): 6 ng/L (ref <18)
Troponin I (High Sensitivity): 9 ng/L (ref <18)

## 2019-08-05 LAB — BRAIN NATRIURETIC PEPTIDE: B Natriuretic Peptide: 51.3 pg/mL (ref 0.0–100.0)

## 2019-08-05 LAB — POC SARS CORONAVIRUS 2 AG -  ED: SARS Coronavirus 2 Ag: NEGATIVE

## 2019-08-05 MED ORDER — IOHEXOL 350 MG/ML SOLN
100.0000 mL | Freq: Once | INTRAVENOUS | Status: AC | PRN
  Administered 2019-08-05: 80 mL via INTRAVENOUS

## 2019-08-05 MED ORDER — DEXAMETHASONE SODIUM PHOSPHATE 10 MG/ML IJ SOLN
10.0000 mg | Freq: Once | INTRAMUSCULAR | Status: AC
  Administered 2019-08-05: 10 mg via INTRAMUSCULAR
  Filled 2019-08-05: qty 1

## 2019-08-05 MED ORDER — ALBUTEROL SULFATE HFA 108 (90 BASE) MCG/ACT IN AERS
2.0000 | INHALATION_SPRAY | Freq: Once | RESPIRATORY_TRACT | Status: AC
  Administered 2019-08-05: 2 via RESPIRATORY_TRACT
  Filled 2019-08-05: qty 6.7

## 2019-08-05 MED ORDER — SODIUM CHLORIDE 0.9% FLUSH: 3.0000 mL | Freq: Once | INTRAVENOUS | Status: DC

## 2019-08-05 MED ORDER — PREDNISONE 20 MG PO TABS: 40.0000 mg | ORAL_TABLET | Freq: Every day | ORAL | 0 refills | Status: DC

## 2019-08-05 NOTE — ED Provider Notes (Signed)
[facility] EMERGENCY DEPARTMENT Provider Note   CSN: [id number] Arrival date & time: 08/05/19  0007     History   Chief Complaint Chief Complaint  Patient presents with   Cough   Shortness of Breath   Chest Pain    HPI Ross Becker is a 73 y.o. male.     HPI  This is a 73 year old male with a history of reflux, hypertension who presents with continued shortness of breath, cough, congestion.  Patient reports he has had symptoms since around Thanksgiving.  He was seen and evaluated at that time.  He was given doxycycline and an inhaler for wheezing.  No history of wheezing or COPD.  No smoking history.  He states he has had continued symptoms.  At that time he was Covid negative.  Patient states he has not gotten any better.  His symptoms are markedly worse at night.  He states that he coughs a lot at night.  Cough is nonproductive.  He does not feel that the inhaler is helping much.  He does take medication Bobi for reflux.  No known history of heart failure.  Denies any leg swelling.  He denies chest pain but does report burning with coughing.  Past Medical History:  Diagnosis Date   Arthritis    Diverticulitis    Enlarged prostate    GERD (gastroesophageal reflux disease)    Gout    Hypertension    Hypothyroidism     Patient Active Problem List   Diagnosis Date Noted   S/P arthroscopy of left knee 03/18/2018   Asbestos-induced pleural plaque 03/08/2018   Other meniscus derangements, posterior horn of medial meniscus, left knee    Synovitis of knee    Unilateral primary osteoarthritis, left knee 11/25/2017   Complex tear of medial meniscus of left knee 10/01/2017   Chest pain 12/09/2016   Unstable angina (HCC)    Weakness of right foot 10/07/2014   Right hemiplegia ([place]) 10/06/2014   TIA (transient ischemic attack) 10/06/2014   Essential hypertension 10/06/2014    Past Surgical History:  Procedure Laterality Date    CHOLECYSTECTOMY     HERNIA REPAIR     KNEE ARTHROSCOPY WITH MEDIAL MENISECTOMY Left 02/26/2018   Procedure: LEFT KNEE ARTHROSCOPY WITH PARTIAL MEDIAL MENISCECTOMY, SYNOVECTOMY;  Surgeon: Leandrew Koyanagi, MD;  Location: [facility];  Service: Orthopedics;  Laterality: Left;        Home Medications    Prior to Admission medications   Medication Sig Start Date End Date Taking? Authorizing Provider  albuterol (VENTOLIN HFA) 108 (90 Base) MCG/ACT inhaler Inhale 1-2 puffs into the lungs every 6 (six) hours as needed for wheezing or shortness of breath. 07/22/19   Long, Wonda Olds, MD  cholecalciferol (VITAMIN D) 1000 UNITS tablet Take 2,000 Units by mouth daily.     [provider]  colchicine 0.6 MG tablet Take 0.6 mg by mouth daily as needed (for gout).     [provider]  cyanocobalamin 1000 MCG tablet Take 1,000 mcg by mouth daily.     [provider]  enalapril (VASOTEC) 20 MG tablet Take 10 mg by mouth daily.    [provider]  levothyroxine (SYNTHROID, LEVOTHROID) 50 MCG tablet Take 50 mcg by mouth daily before breakfast.    [provider]  loperamide (IMODIUM) 2 MG capsule Take 4 mg by mouth as needed for diarrhea or loose stools.    [provider]  omeprazole (PRILOSEC) 20 MG  capsule Take 40 mg by mouth daily.     [provider]  predniSONE (DELTASONE) 20 MG tablet Take 2 tablets (40 mg total) by mouth daily. 08/05/19   Tequia Wolman, Barbette Hair, MD  terazosin (HYTRIN) 2 MG capsule Take 8 mg by mouth at bedtime.    [provider]    Family History Family History  Problem Relation Age of Onset   Brain cancer Mother    Cancer Father     Social History Social History   Tobacco Use   Smoking status: Never Smoker   Smokeless tobacco: Never Used  Substance Use Topics   Alcohol use: Yes    Comment: occasionally   Drug use: No     Allergies   Mushroom extract complex and  Penicillins   Review of Systems Review of Systems  Constitutional: Negative for fever.  HENT: Positive for congestion.   Respiratory: Positive for cough and shortness of breath.   Cardiovascular: Negative for chest pain.  Gastrointestinal: Negative for abdominal pain, nausea and vomiting.  Genitourinary: Negative for dysuria.  All other systems reviewed and are negative.    Physical Exam Updated Vital Signs BP (!) 131/105 (BP Location: Right Arm)    Pulse 83    Temp 98.8 F (37.1 C) (Oral)    Resp 14    Ht 1.829 m (6')    Wt 81.6 kg    SpO2 93%    BMI 24.41 kg/m   Physical Exam Vitals signs and nursing note reviewed.  Constitutional:      Appearance: He is well-developed. He is not ill-appearing.  HENT:     Head: Normocephalic and atraumatic.  Eyes:     Pupils: Pupils are equal, round, and reactive to light.  Neck:     Musculoskeletal: Neck supple.  Cardiovascular:     Rate and Rhythm: Normal rate and regular rhythm.     Heart sounds: Normal heart sounds. No murmur.  Pulmonary:     Effort: Pulmonary effort is normal. No respiratory distress.     Breath sounds: Wheezing present.     Comments: Occasional cough, wheezing in all lung fields with fair air movement Abdominal:     General: Bowel sounds are normal.     Palpations: Abdomen is soft.     Tenderness: There is no abdominal tenderness. There is no rebound.  Musculoskeletal:     Comments: Trace bilateral lower extremity edema  Skin:    General: Skin is warm and dry.  Neurological:     Mental Status: He is alert and oriented to person, place, and time.  Psychiatric:        Mood and Affect: Mood normal.      ED Treatments / Results  Labs (all labs ordered are listed, but only abnormal results are displayed) Labs Reviewed  CBC - Abnormal; Notable for the following components:      Result Value   MCH 34.8 (*)    RDW 11.3 (*)    All other components within normal limits  BASIC METABOLIC PANEL  BRAIN  NATRIURETIC PEPTIDE  POC SARS CORONAVIRUS 2 AG -  ED  TROPONIN I (HIGH SENSITIVITY)  TROPONIN I (HIGH SENSITIVITY)    EKG EKG Interpretation  Date/Time:  Wednesday August 05 2019 00:10:04 EST Ventricular Rate:  92 PR Interval:  152 QRS Duration: 80 QT Interval:  352 QTC Calculation: 435 R Axis:   25 Text Interpretation: Sinus rhythm with occasional Premature ventricular complexes Otherwise normal ECG No significant change since  last tracing Confirmed by Thayer Jew [phone]) on 08/05/2019 3:18:57 AM   Radiology Dg Chest 2 View  Result Date: 08/05/2019 CLINICAL DATA:  Chest pain EXAM: CHEST - 2 VIEW COMPARISON:  None. FINDINGS: The heart size and mediastinal contours are within normal limits. Both lungs are clear. The visualized skeletal structures are unremarkable. Again noted are calcified pleural based plaques, similar to prior study. IMPRESSION: No active cardiopulmonary disease. Electronically Signed   By: Constance Holster M.D.   On: 08/05/2019 00:41   Ct Angio Chest Pe W And/or Wo Contrast  Result Date: 08/05/2019 CLINICAL DATA:  Shortness of breath with chest tightness EXAM: CT ANGIOGRAPHY CHEST WITH CONTRAST TECHNIQUE: Multidetector CT imaging of the chest was performed using the standard protocol during bolus administration of intravenous contrast. Multiplanar CT image reconstructions and MIPs were obtained to evaluate the vascular anatomy. CONTRAST:  2mL OMNIPAQUE IOHEXOL 350 MG/ML SOLN COMPARISON:  None. FINDINGS: Cardiovascular: Generous heart size. No pericardial effusion. No pulmonary artery filling defect. Negative aorta Mediastinum/Nodes: Negative for adenopathy or mass. Lungs/Pleura: Calcified pleural plaques on both sides. Mild generalized airway thickening. There is no edema, consolidation, effusion, or pneumothorax. No suspicious pulmonary nodule. Upper Abdomen: Cholecystectomy. Musculoskeletal: No acute finding.  Exaggerated thoracic kyphosis. Review of the MIP  images confirms the above findings. IMPRESSION: 1. Negative for pulmonary embolism or other acute finding. 2. Asbestos related pleural disease. Electronically Signed   By: Monte Fantasia M.D.   On: 08/05/2019 07:02    Procedures Procedures (including critical care time)  Medications Ordered in ED Medications  sodium chloride flush (NS) 0.9 % injection 3 mL (3 mLs Intravenous Not Given 08/05/19 0411)  dexamethasone (DECADRON) injection 10 mg (10 mg Intramuscular Given 08/05/19 0410)  albuterol (VENTOLIN HFA) 108 (90 Base) MCG/ACT inhaler 2 puff (2 puffs Inhalation Given 08/05/19 0410)  iohexol (OMNIPAQUE) 350 MG/ML injection 100 mL (80 mLs Intravenous Contrast Given 08/05/19 0629)     Initial Impression / Assessment and Plan / ED Course  I have reviewed the triage vital signs and the nursing notes.  Pertinent labs & imaging results that were available during my care of the patient were reviewed by me and considered in my medical decision making (see chart for details).        The patient who presents with persistent shortness of breath, cough, wheezing.  No history of the same.  He was seen and evaluated.  Note Covid negative at that time.  He was sent home with albuterol and doxycycline.  He reports persistent symptoms despite treatment.  That time he was not given steroids because of some hyperglycemia.  He continues to wheeze on my assessment.  He is not significantly hypoxic he is in the mid 90s.  He is in no respiratory distress.  Repeat work-up initiated.  Covid negative.  Chest x-ray reassuring.  I did obtain a CT scan to rule out PE and get a better picture of his lung parenchyma.  CT scan shows evidence of asbestos exposure.  Otherwise no PE.  Patient has remained clinically stable.  He was able to ambulate and maintain his pulse ox.  Given wheezing, will treat for bronchitis and do a tapered dose of steroids.  Patient reports that he was aware that he had asbestos exposure in the [employer].   Recommend follow-up with pulmonology.  After history, exam, and medical workup I feel the patient has been appropriately medically screened and is safe for discharge home. Pertinent diagnoses were discussed with the patient. Patient  was given return precautions.  Glean Siracuse was evaluated in Emergency Department on 08/05/2019 for the symptoms described in the history of present illness. He was evaluated in the context of the global COVID-19 pandemic, which necessitated consideration that the patient might be at risk for infection with the SARS-CoV-2 virus that causes COVID-19. Institutional protocols and algorithms that pertain to the evaluation of patients at risk for COVID-19 are in a state of rapid change based on information released by regulatory bodies including the CDC and federal and state organizations. These policies and algorithms were followed during the patient's care in the ED.   Final Clinical Impressions(s) / ED Diagnoses   Final diagnoses:  Cough  Bronchitis  Asbestos exposure    ED Discharge Orders         Ordered    predniSONE (DELTASONE) 20 MG tablet  Daily     08/05/19 0753           Merryl Hacker, MD 08/05/19 [phone]

## 2019-08-05 NOTE — Discharge Instructions (Addendum)
You were seen today for continued cough and congestion.  You have changes related to asbestos on your CT.  Given your ongoing wheezing and cough, I feel you would benefit from a course of steroids for bronchitis.  Take as directed.  Continue your albuterol every 4 hours as needed.

## 2019-08-05 NOTE — ED Notes (Signed)
Patient verbalized understanding of dc instructions, vss, ambulatory with nad.   

## 2019-08-05 NOTE — ED Notes (Signed)
Pt ambulated without assistance. Pt's O2 saturation was 94% prior to ambulation, and O2 saturation remained at 93% while ambulating.

## 2019-08-05 NOTE — ED Triage Notes (Addendum)
Pt to ED via [facility] with c/o congestion, shortness of breath and wheezing x's 2 weeks.  Pt was seen here on 11/25 for same and given meds but st's they did not help  Pt speaking in full sentences in triage.  Pt also c/o tightness and burning in his chest

## 2020-02-24 ENCOUNTER — Other Ambulatory Visit (INDEPENDENT_AMBULATORY_CARE_PROVIDER_SITE_OTHER): Payer: Medicare Other

## 2020-02-24 ENCOUNTER — Ambulatory Visit (INDEPENDENT_AMBULATORY_CARE_PROVIDER_SITE_OTHER): Payer: No Typology Code available for payment source | Admitting: Gastroenterology

## 2020-02-24 VITALS — HR 107 | Ht 71.0 in | Wt 196.8 lb

## 2020-02-24 DIAGNOSIS — R933 Abnormal findings on diagnostic imaging of other parts of digestive tract: Secondary | ICD-10-CM

## 2020-02-24 DIAGNOSIS — R1011 Right upper quadrant pain: Secondary | ICD-10-CM

## 2020-02-24 DIAGNOSIS — R7401 Elevation of levels of liver transaminase levels: Secondary | ICD-10-CM

## 2020-02-24 DIAGNOSIS — Z1211 Encounter for screening for malignant neoplasm of colon: Secondary | ICD-10-CM

## 2020-02-24 LAB — HEPATIC FUNCTION PANEL
ALT: 29 U/L (ref 0–53)
AST: 29 U/L (ref 0–37)
Albumin: 4.4 g/dL (ref 3.5–5.2)
Alkaline Phosphatase: 102 U/L (ref 39–117)
Bilirubin, Direct: 0.2 mg/dL (ref 0.0–0.3)
Total Bilirubin: 0.6 mg/dL (ref 0.2–1.2)
Total Protein: 7 g/dL (ref 6.0–8.3)

## 2020-02-24 LAB — IBC + FERRITIN
Ferritin: 165.9 ng/mL (ref 22.0–322.0)
Iron: 128 ug/dL (ref 42–165)
Saturation Ratios: 32.8 % (ref 20.0–50.0)
Transferrin: 279 mg/dL (ref 212.0–360.0)

## 2020-02-24 LAB — PROTIME-INR
INR: 1 ratio (ref 0.8–1.0)
Prothrombin Time: 11.1 s (ref 9.6–13.1)

## 2020-02-24 NOTE — Patient Instructions (Signed)
If you are age 73 or older, your body mass index should be between 23-30. Your Body mass index is 27.45 kg/m. If this is out of the aforementioned range listed, please consider follow up with your Primary Care Provider.  If you are age 73 or younger, your body mass index should be between 19-25. Your Body mass index is 27.45 kg/m. If this is out of the aformentioned range listed, please consider follow up with your Primary Care Provider.   You have been scheduled for an endoscopy and colonoscopy. Please follow the written instructions given to you at your visit today. Please pick up your prep supplies at the pharmacy within the next 1-3 days. If you use inhalers (even only as needed), please bring them with you on the day of your procedure.  Your provider has requested that you go to the basement level for lab work before leaving today. Press "B" on the elevator. The lab is located at the first door on the left as you exit the elevator.  Due to recent changes in healthcare laws, you may see the results of your imaging and laboratory studies on MyChart before your provider has had a chance to review them.  We understand that in some cases there may be results that are confusing or concerning to you. Not all laboratory results come back in the same time frame and the provider may be waiting for multiple results in order to interpret others.  Please give [place] 48 hours in order for your provider to thoroughly review all the results before contacting the office for clarification of your results.    It was a pleasure to see you today!  Dr. Loletha Carrow

## 2020-02-24 NOTE — Progress Notes (Signed)
[facility] Consult Note:  History: Ross Becker 02/24/2020  Referring provider: [name]  Reason for consult/chief complaint: Bloated (RUQ discomfort had [place] at the [place]), RUQ discomfort, and Colonoscopy (Its been 10 years since last colonoscopy)   Subjective  HPI: [name] note dated 01/23/2020 indicates patient with weakness in balance difficulties. Ultrasound right upper quadrant reportedly shows "findings consistent with chronic hepatocellular disease".  2 cm stable hyper echoic solid avascular liver lesion in the right lobe nonspecific by ultrasound, described on 2017 CT scan, unchanged size most likely benign. Liver enzymes have been elevated, patient describes himself as a moderate drinker"  Patient indicates he has right upper quadrant pain and it has been at least 10 years since his last colonoscopy. Some years ago he was apparently supposed to have one at the [place], but it was canceled for some reason and never rescheduled.  Ross Becker is a very pleasant 74 year old man who describes at least several months of right upper quadrant dull bloated discomfort that is sometimes worse laying on that side but otherwise no clear triggers or relieving factors. This seems to have prompted some lab work and then an ultrasound done at the [place]. He tends toward occasional loose stool for many years, no change in those bowel habits lately, no rectal bleeding.   ROS:  Review of Systems  Constitutional: Negative for appetite change and unexpected weight change.  HENT: Negative for mouth sores and voice change.   Eyes: Negative for pain and redness.  Respiratory: Negative for cough and shortness of breath.   Cardiovascular: Negative for chest pain and palpitations.  Genitourinary: Negative for dysuria and hematuria.  Musculoskeletal: Negative for arthralgias and myalgias.  Skin: Negative for pallor and rash.  Neurological:  Negative for weakness and headaches.       Intermittent lightheaded and balance issues  Hematological: Negative for adenopathy.     Past Medical History: Past Medical History:  Diagnosis Date  . Arthritis   . Balance problems   . Cervicalgia   . Diverticulitis   . Enlarged prostate   . GERD (gastroesophageal reflux disease)   . Gout   . Hypertension   . Hypothyroidism   . Liver lesion      Past Surgical History: Past Surgical History:  Procedure Laterality Date  . CHOLECYSTECTOMY    . HERNIA REPAIR    . KNEE ARTHROSCOPY WITH MEDIAL MENISECTOMY Left 02/26/2018   Procedure: LEFT KNEE ARTHROSCOPY WITH PARTIAL MEDIAL MENISCECTOMY, SYNOVECTOMY;  Surgeon: Leandrew Koyanagi, MD;  Location: [facility];  Service: Orthopedics;  Laterality: Left;     Family History: Family History  Problem Relation Age of Onset  . Brain cancer Mother   . Cancer Father     Social History: Social History   Socioeconomic History  . Marital status: Single    Spouse name: Not on file  . Number of children: Not on file  . Years of education: Not on file  . Highest education level: Not on file  Occupational History  . Not on file  Tobacco Use  . Smoking status: Never Smoker  . Smokeless tobacco: Never Used  Vaping Use  . Vaping Use: Never used  Substance and Sexual Activity  . Alcohol use: Yes    Comment: occasionally  . Drug use: No  . Sexual activity: Not on file  Other Topics Concern  . Not on file  Social History Narrative  . Not on file  Social Determinants of Health   Financial Resource Strain:   . Difficulty of Paying Living Expenses:   Food Insecurity:   . Worried About Charity fundraiser in the Last Year:   . Arboriculturist in the Last Year:   Transportation Needs:   . Film/video editor (Medical):   Marland Kitchen Lack of Transportation (Non-Medical):   Physical Activity:   . Days of Exercise per Week:   . Minutes of Exercise per Session:   Stress:   . Feeling  of Stress :   Social Connections:   . Frequency of Communication with Friends and Family:   . Frequency of Social Gatherings with Friends and Family:   . Attends Religious Services:   . Active Member of Clubs or Organizations:   . Attends Archivist Meetings:   Marland Kitchen Marital Status:    Reports 2 to 3 glasses of white wine a few days a week.  Allergies: Allergies  Allergen Reactions  . Mushroom Extract Complex     Avoids due to gout  . Penicillins Other (See Comments)    "childhood allergy"    Outpatient Meds: Current Outpatient Medications  Medication Sig Dispense Refill  . albuterol (VENTOLIN HFA) 108 (90 Base) MCG/ACT inhaler Inhale 1-2 puffs into the lungs every 6 (six) hours as needed for wheezing or shortness of breath. 6.7 g 0  . cholecalciferol (VITAMIN D) 1000 UNITS tablet Take 2,000 Units by mouth daily.     . colchicine 0.6 MG tablet Take 0.6 mg by mouth daily as needed (for gout).     . cyanocobalamin 1000 MCG tablet Take 1,000 mcg by mouth daily.     . enalapril (VASOTEC) 20 MG tablet Take 10 mg by mouth daily.    Marland Kitchen levothyroxine (SYNTHROID, LEVOTHROID) 50 MCG tablet Take 50 mcg by mouth daily before breakfast.    . loperamide (IMODIUM) 2 MG capsule Take 4 mg by mouth as needed for diarrhea or loose stools.    Marland Kitchen omeprazole (PRILOSEC) 20 MG capsule Take 40 mg by mouth daily.     Marland Kitchen terazosin (HYTRIN) 2 MG capsule Take 8 mg by mouth at bedtime.     No current facility-administered medications for this visit.      ___________________________________________________________________ Objective   Exam:  Pulse (!) 107   Ht 5\' 11"  (1.803 m)   Wt 196 lb 12.8 oz (89.3 kg)   SpO2 96%   BMI 27.45 kg/m    General: Well-appearing  Eyes: sclera anicteric, no redness  ENT: oral mucosa moist without lesions, no cervical or supraclavicular lymphadenopathy  CV: RRR without murmur, S1/S2, no JVD, no peripheral edema  Resp: clear to auscultation bilaterally,  normal RR and effort noted  GI: soft, no tenderness, with active bowel sounds. No guarding or palpable organomegaly noted.  Skin; warm and dry, no rash or jaundice noted. Ruddy complexion. No spider nevi  Neuro: awake, alert and oriented x 3. Normal gross motor function and fluent speech  Labs:  [facility] lab studies dated 10/30/2019: AST 28, ALT 49, total bilirubin 0.5 Total cholesterol 122 Creatinine 1.0, glucose 90, hematocrit 41, hemoglobin 14.5, hemoglobin A1c 5.0, platelet 200, potassium 4.5, sodium 135, triglyceride 105, WBC 6  Radiologic Studies:  Ultrasound report with findings as noted above, dated 12/25/2019.  Exam reportedly being done "for [facility] screening" Liver noted to be 16.4 cm, normal portal and hepatic venous flow, CBD normal caliber, no ascites, prior cholecystectomy.  Assessment: Encounter Diagnoses  Name Primary?  Marland Kitchen  Transaminitis Yes  . Abnormal finding on GI tract imaging   . RUQ pain   . Special screening for malignant neoplasms, colon     Mild elevation ALT, no priors for comparison. Possibly related to alcohol use. Not sure if he has been tested for viral hepatitis in the past, but most likely was in the [place] system. His right upper quadrant/lower chest dull discomfort is somewhat difficult to characterize, does not seem likely to be pathological. Overdue for colon cancer screening by his report Plan:  Upper endoscopy to investigate right upper quadrant pain, screening colonoscopy same time.  The benefits and risks of the planned procedure were described in detail with the patient or (when appropriate) their health care proxy.  Risks were outlined as including, but not limited to, bleeding, infection, perforation, adverse medication reaction leading to cardiac or pulmonary decompensation, pancreatitis (if ERCP).  The limitation of incomplete mucosal visualization was also discussed.  No guarantees or warranties were given.  Repeat hepatic function panel today, iron  studies and hepatitis B and C serologies. If LFTs remain elevated, might recommend complete alcohol abstinence for 2-3 months with repeat LFT check afterwards.  Thank you for the courtesy of this consult.  Please call me with any questions or concerns.  Nelida Meuse III  CC: Referring provider noted above

## 2020-02-25 LAB — HEPATITIS C ANTIBODY
Hepatitis C Ab: NONREACTIVE
SIGNAL TO CUT-OFF: 0.01 (ref <1.00)

## 2020-02-25 LAB — HEPATITIS B SURFACE ANTIGEN: Hepatitis B Surface Ag: NONREACTIVE

## 2020-02-25 LAB — HEPATITIS B CORE ANTIBODY, TOTAL: Hep B Core Total Ab: NONREACTIVE

## 2020-03-21 ENCOUNTER — Telehealth: Payer: Self-pay | Admitting: Gastroenterology

## 2020-03-22 NOTE — Telephone Encounter (Signed)
Spoke with patient and he is concerned about the EGD tomorrow. Reports he gags easily. Discussed with patient that he will be sedated and numb throat also.Marland Kitchen He also wants the MD to know he has a "natural fusion at 6 and 7 in his back. Informed patient that MD will talk with him prior to procedure also.

## 2020-03-22 NOTE — Telephone Encounter (Signed)
Patient is returning your call please call back

## 2020-03-22 NOTE — Telephone Encounter (Signed)
Unable to reach patient at the number given. Voice mailbox is not set up.

## 2020-03-22 NOTE — Telephone Encounter (Signed)
I will speak with the patient before the procedure as always, as well anesthesia.  Of note, we do not give spray to numb the throat, as deep sedation with propofol will keep him from gagging.  Thank you for letting me know about the orthopedic issue.  The patient will actually get himself into position for the procedure before being sedated.  - HD

## 2020-03-23 ENCOUNTER — Ambulatory Visit (AMBULATORY_SURGERY_CENTER): Payer: No Typology Code available for payment source | Admitting: Gastroenterology

## 2020-03-23 ENCOUNTER — Encounter: Payer: Self-pay | Admitting: Gastroenterology

## 2020-03-23 ENCOUNTER — Other Ambulatory Visit: Payer: Self-pay

## 2020-03-23 VITALS — BP 105/63 | HR 72 | Temp 97.8°F | Resp 13 | Ht 71.0 in | Wt 196.0 lb

## 2020-03-23 DIAGNOSIS — Z1211 Encounter for screening for malignant neoplasm of colon: Secondary | ICD-10-CM | POA: Diagnosis not present

## 2020-03-23 DIAGNOSIS — K621 Rectal polyp: Secondary | ICD-10-CM | POA: Diagnosis not present

## 2020-03-23 DIAGNOSIS — K317 Polyp of stomach and duodenum: Secondary | ICD-10-CM | POA: Diagnosis not present

## 2020-03-23 DIAGNOSIS — D128 Benign neoplasm of rectum: Secondary | ICD-10-CM

## 2020-03-23 DIAGNOSIS — R7401 Elevation of levels of liver transaminase levels: Secondary | ICD-10-CM

## 2020-03-23 DIAGNOSIS — R1011 Right upper quadrant pain: Secondary | ICD-10-CM

## 2020-03-23 DIAGNOSIS — D123 Benign neoplasm of transverse colon: Secondary | ICD-10-CM | POA: Diagnosis not present

## 2020-03-23 DIAGNOSIS — K449 Diaphragmatic hernia without obstruction or gangrene: Secondary | ICD-10-CM | POA: Diagnosis not present

## 2020-03-23 MED ORDER — SODIUM CHLORIDE 0.9 % IV SOLN: 500.0000 mL | Freq: Once | INTRAVENOUS | Status: DC

## 2020-03-23 NOTE — Patient Instructions (Signed)
Impression/Recommendations:  Hiatal hernia handout given to patient. Polyp handout given to patient. Diverticulosis handout given to patient.  Resume previous diet. Continue present medications. Await pathology results.  Based on current guidelines, no repeat surveillance colonoscopy recommended. Return to referring physician.  YOU HAD AN ENDOSCOPIC PROCEDURE TODAY AT [facility]:   Refer to the procedure report that was given to you for any specific questions about what was found during the examination.  If the procedure report does not answer your questions, please call your gastroenterologist to clarify.  If you requested that your care partner not be given the details of your procedure findings, then the procedure report has been included in a sealed envelope for you to review at your convenience later.  YOU SHOULD EXPECT: Some feelings of bloating in the abdomen. Passage of more gas than usual.  Walking can help get rid of the air that was put into your GI tract during the procedure and reduce the bloating. If you had a lower endoscopy (such as a colonoscopy or flexible sigmoidoscopy) you may notice spotting of blood in your stool or on the toilet paper. If you underwent a bowel prep for your procedure, you may not have a normal bowel movement for a few days.  Please Note:  You might notice some irritation and congestion in your nose or some drainage.  This is from the oxygen used during your procedure.  There is no need for concern and it should clear up in a day or so.  SYMPTOMS TO REPORT IMMEDIATELY:   Following lower endoscopy (colonoscopy or flexible sigmoidoscopy):  Excessive amounts of blood in the stool  Significant tenderness or worsening of abdominal pains  Swelling of the abdomen that is new, acute  Fever of 100F or higher   Following upper endoscopy (EGD)  Vomiting of blood or coffee ground material  New chest pain or pain under the shoulder  blades  Painful or persistently difficult swallowing  New shortness of breath  Fever of 100F or higher  Black, tarry-looking stools  For urgent or emergent issues, a gastroenterologist can be reached at any hour by calling [phone]. Do not use MyChart messaging for urgent concerns.    DIET:  We do recommend a small meal at first, but then you may proceed to your regular diet.  Drink plenty of fluids but you should avoid alcoholic beverages for 24 hours.  ACTIVITY:  You should plan to take it easy for the rest of today and you should NOT DRIVE or use heavy machinery until tomorrow (because of the sedation medicines used during the test).    FOLLOW UP: Our staff will call the number listed on your records 48-72 hours following your procedure to check on you and address any questions or concerns that you may have regarding the information given to you following your procedure. If we do not reach you, we will leave a message.  We will attempt to reach you two times.  During this call, we will ask if you have developed any symptoms of COVID 19. If you develop any symptoms (ie: fever, flu-like symptoms, shortness of breath, cough etc.) before then, please call [phone].  If you test positive for Covid 19 in the 2 weeks post procedure, please call and report this information to [place].    If any biopsies were taken you will be contacted by phone or by letter within the next 1-3 weeks.  Please call us at [phone] if you  have not heard about the biopsies in 3 weeks.    SIGNATURES/CONFIDENTIALITY: You and/or your care partner have signed paperwork which will be entered into your electronic medical record.  These signatures attest to the fact that that the information above on your After Visit Summary has been reviewed and is understood.  Full responsibility of the confidentiality of this discharge information lies with you and/or your care-partner.

## 2020-03-23 NOTE — Progress Notes (Signed)
Pt's states no medical or surgical changes since previsit or office visit.  Vitals- Courtney 

## 2020-03-23 NOTE — Op Note (Signed)
[place] Patient Name: Ross Becker Procedure Date: 03/23/2020 2:25 PM MRN: [id number] Endoscopist: Mallie Mussel L. Loletha Carrow , MD Age: 74 Referring MD:  Date of Birth: 01-Apr-1946 Gender: Male Account #: [id number] Procedure:                Upper GI endoscopy Indications:              Abdominal pain in the right upper quadrant Medicines:                Monitored Anesthesia Care Procedure:                Pre-Anesthesia Assessment:                           - Prior to the procedure, a History and Physical                            was performed, and patient medications and                            allergies were reviewed. The patient's tolerance of                            previous anesthesia was also reviewed. The risks                            and benefits of the procedure and the sedation                            options and risks were discussed with the patient.                            All questions were answered, and informed consent                            was obtained. Prior Anticoagulants: The patient has                            taken no previous anticoagulant or antiplatelet                            agents. ASA Grade Assessment: II - A patient with                            mild systemic disease. After reviewing the risks                            and benefits, the patient was deemed in                            satisfactory condition to undergo the procedure.                           After obtaining informed consent, the endoscope was  passed under direct vision. Throughout the                            procedure, the patient's blood pressure, pulse, and                            oxygen saturations were monitored continuously. The                            Endoscope was introduced through the mouth, and                            advanced to the second part of duodenum. The upper                            GI  endoscopy was accomplished without difficulty.                            The patient tolerated the procedure well. Scope In: Scope Out: Findings:                 The larynx was normal.                           A small hiatal hernia was present.                           The exam of the esophagus was otherwise normal.                           A few small sessile fundic gland polyps were found                            in the gastric body.                           The exam of the stomach was otherwise normal.                           The cardia and gastric fundus were normal on                            retroflexion.                           The examined duodenum was normal. Complications:            No immediate complications. Estimated Blood Loss:     Estimated blood loss: none. Impression:               - Normal larynx.                           - Small hiatal hernia.                           - A few fundic gland polyps.                           -  Normal examined duodenum.                           - No specimens collected.                           No digestive cause of RUQ pain found, somehat                            positional in nature and may be musculoskeletal. Recommendation:           - Patient has a contact number available for                            emergencies. The signs and symptoms of potential                            delayed complications were discussed with the                            patient. Return to normal activities tomorrow.                            Written discharge instructions were provided to the                            patient.                           - Resume previous diet.                           - Continue present medications.                           - Return to referring physician.                           - See the other procedure note for documentation of                            additional recommendations. Citlaly Camplin L.  Loletha Carrow, MD 03/23/2020 2:59:59 PM This report has been signed electronically.

## 2020-03-23 NOTE — Progress Notes (Signed)
pt tolerated well. VSS. awake and to recovery. Report given to RN. Bite block inserted and removed without trauma. 

## 2020-03-23 NOTE — Op Note (Signed)
[facility] Patient Name: Ross Becker Procedure Date: 03/23/2020 2:25 PM MRN: [id number] Endoscopist: Mallie Mussel L. Loletha Carrow , MD Age: 74 Referring MD:  Date of Birth: 23-Apr-1946 Gender: Male Account #: [id number] Procedure:                Colonoscopy Indications:              Screening for colorectal malignant neoplasm (last                            colonoscopy rortedly > 10 year ago) Medicines:                Monitored Anesthesia Care Procedure:                Pre-Anesthesia Assessment:                           - Prior to the procedure, a History and Physical                            was performed, and patient medications and                            allergies were reviewed. The patient's tolerance of                            previous anesthesia was also reviewed. The risks                            and benefits of the procedure and the sedation                            options and risks were discussed with the patient.                            All questions were answered, and informed consent                            was obtained. Prior Anticoagulants: The patient has                            taken no previous anticoagulant or antiplatelet                            agents. ASA Grade Assessment: II - A patient with                            mild systemic disease. After reviewing the risks                            and benefits, the patient was deemed in                            satisfactory condition to undergo the procedure.  After obtaining informed consent, the colonoscope                            was passed under direct vision. Throughout the                            procedure, the patient's blood pressure, pulse, and                            oxygen saturations were monitored continuously. The                            Colonoscope was introduced through the anus and                            advanced to the the  cecum, identified by                            appendiceal orifice and ileocecal valve. The                            colonoscopy was performed without difficulty. The                            patient tolerated the procedure well. The quality                            of the bowel preparation was good. The ileocecal                            valve, appendiceal orifice, and rectum were                            photographed. Scope In: 2:40:05 PM Scope Out: 2:54:54 PM Scope Withdrawal Time: 0 hours 10 minutes 54 seconds  Total Procedure Duration: 0 hours 14 minutes 49 seconds  Findings:                 The perianal and digital rectal examinations were                            normal.                           Multiple diverticula were found in the entire colon.                           Two sessile polyps were found in the rectum and                            transverse colon. The polyps were diminutive in                            size. These polyps were removed with a cold snare.  Resection and retrieval were complete.                           The exam was otherwise without abnormality on                            direct and retroflexion views. Complications:            No immediate complications. Estimated Blood Loss:     Estimated blood loss was minimal. Impression:               - Diverticulosis in the entire examined colon.                           - Two diminutive polyps in the rectum and in the                            transverse colon, removed with a cold snare.                            Resected and retrieved.                           - The examination was otherwise normal on direct                            and retroflexion views. Recommendation:           - Patient has a contact number available for                            emergencies. The signs and symptoms of potential                            delayed complications were  discussed with the                            patient. Return to normal activities tomorrow.                            Written discharge instructions were provided to the                            patient.                           - Resume previous diet.                           - Continue present medications.                           - Await pathology results.                           - Based on current guidelines, no repeat  surveillance colonoscopy recommended. Nyles Mitton L. Loletha Carrow, MD 03/23/2020 3:03:15 PM This report has been signed electronically.

## 2020-03-25 ENCOUNTER — Telehealth: Payer: Self-pay | Admitting: *Deleted

## 2020-03-25 ENCOUNTER — Telehealth: Payer: Self-pay

## 2020-03-25 NOTE — Telephone Encounter (Signed)
°  Follow up Call-  Call back number 03/23/2020  Post procedure Call Back phone  # [phone]  Permission to leave phone message Yes  Some recent data might be hidden     Patient questions:  Do you have a fever, pain , or abdominal swelling? No. Pain Score  0 *  Have you tolerated food without any problems? Yes.    Have you been able to return to your normal activities? Yes.    Do you have any questions about your discharge instructions: Diet   No. Medications  No. Follow up visit  No.  Do you have questions or concerns about your Care? No.  Actions: * If pain score is 4 or above: No action needed, pain <4. 1. Have you developed a fever since your procedure? no  2.   Have you had an respiratory symptoms (SOB or cough) since your procedure? no  3.   Have you tested positive for COVID 19 since your procedure no  4.   Have you had any family members/close contacts diagnosed with the COVID 19 since your procedure?  no   If yes to any of these questions please route to Joylene John, RN and Erenest Rasher, RN

## 2020-03-25 NOTE — Telephone Encounter (Signed)
°  Follow up Call-  Call back number 03/23/2020  Post procedure Call Back phone  # [phone]  Permission to leave phone message Yes  Some recent data might be hidden    No answer, VM not set up

## 2020-03-30 ENCOUNTER — Encounter: Payer: Self-pay | Admitting: Gastroenterology

## 2020-07-12 DIAGNOSIS — Z23 Encounter for immunization: Secondary | ICD-10-CM | POA: Diagnosis not present

## 2020-11-21 IMAGING — DX DG CHEST 1V PORT
1 series · 1 of 1 positions shown · non-contrast
Comparison: 12/30/2016

CLINICAL DATA: Congestion, shortness of breath, and wheezing for
the past 2 days.

EXAM:
PORTABLE CHEST 1 VIEW

[chest ap]
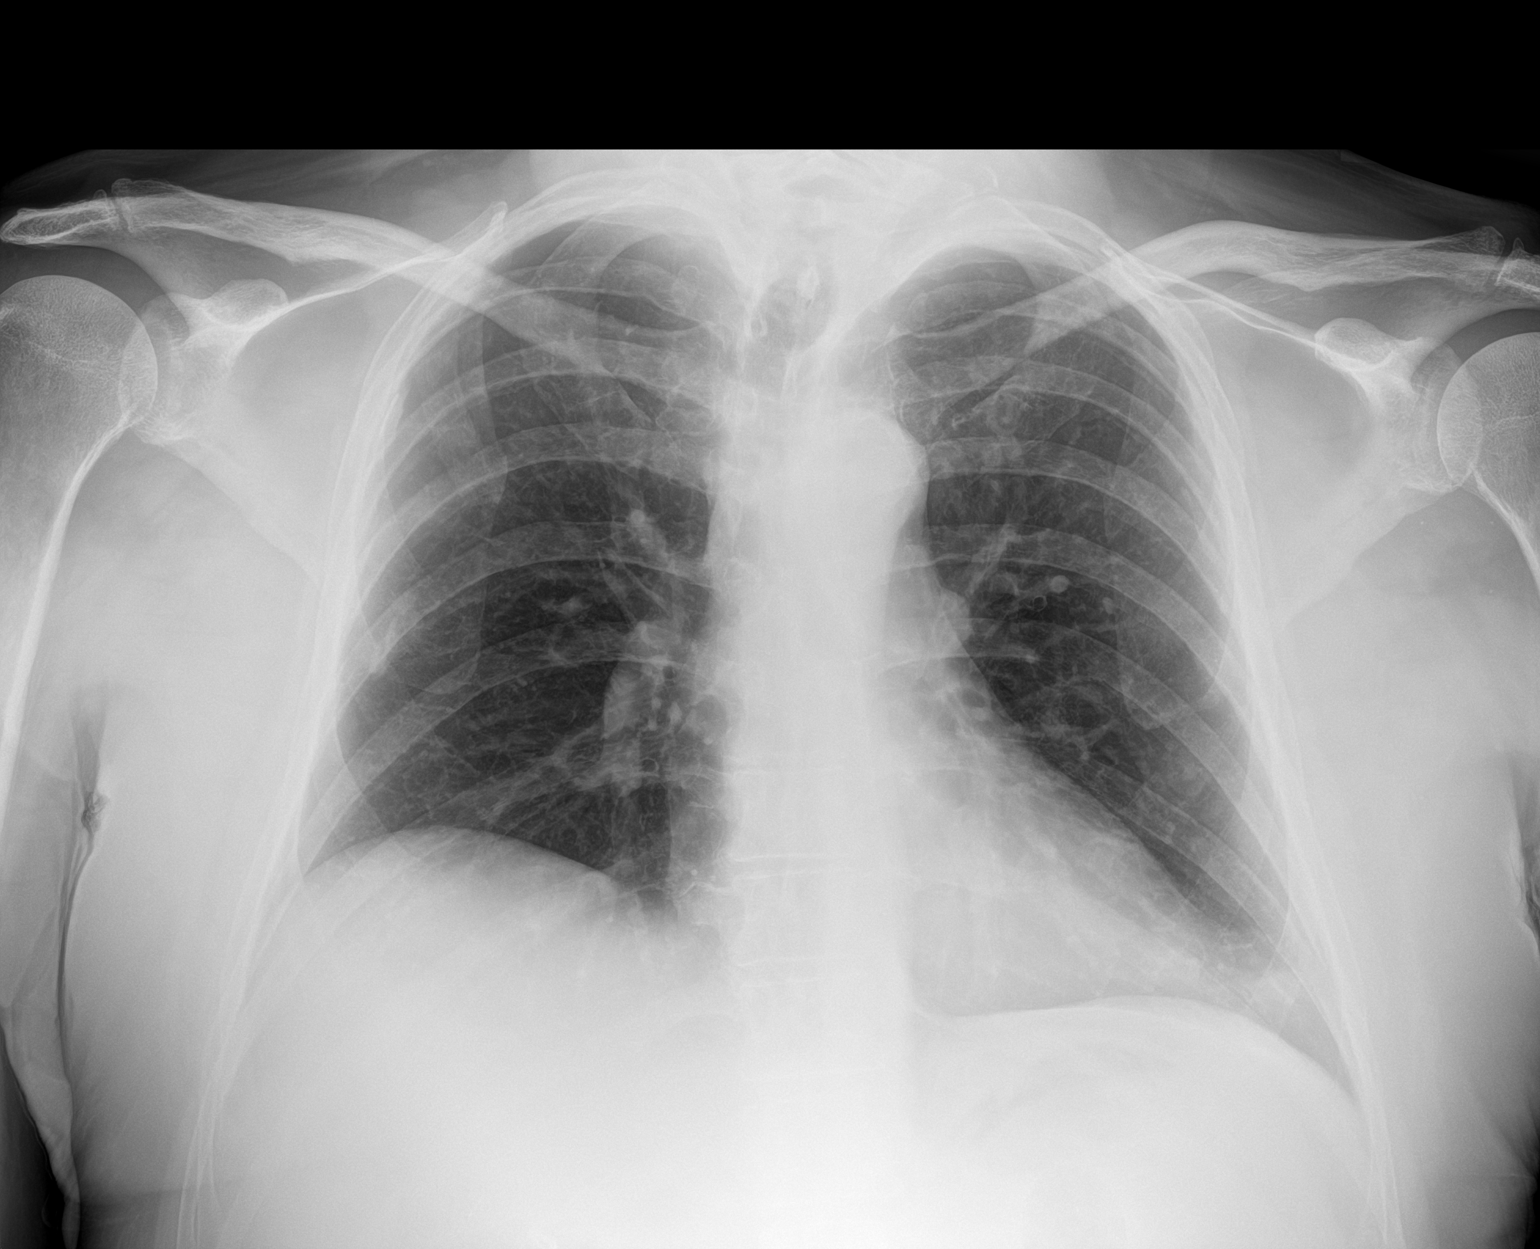

[1 of 1 positions shown; findings below may reference images not displayed]

FINDINGS: Mildly low lung volumes. Cardiac and mediastinal margins appear
normal.

There bilateral pleural calcifications causing some vague densities
along the right lateral hemithorax. Left-sided pleural
calcifications are also present. Appearance suggests remote asbestos
exposure.

The lungs appear otherwise clear. No blunting of the costophrenic
angles.
IMPRESSION: 1. No active cardiopulmonary disease is radiographically apparent.
2. Bilateral pleural calcifications suggesting remote asbestos
exposure.

## 2021-05-01 ENCOUNTER — Encounter (HOSPITAL_COMMUNITY): Payer: Self-pay | Admitting: Oncology

## 2021-05-01 ENCOUNTER — Other Ambulatory Visit: Payer: Self-pay

## 2021-05-01 ENCOUNTER — Emergency Department (HOSPITAL_COMMUNITY): Payer: No Typology Code available for payment source

## 2021-05-01 ENCOUNTER — Emergency Department (HOSPITAL_COMMUNITY)
Admission: EM | Admit: 2021-05-01 | Discharge: 2021-05-01 | Disposition: A | Discharge status: Home / Self Care | Payer: No Typology Code available for payment source | Attending: Emergency Medicine | Admitting: Emergency Medicine

## 2021-05-01 DIAGNOSIS — W01198A Fall on same level from slipping, tripping and stumbling with subsequent striking against other object, initial encounter: Secondary | ICD-10-CM | POA: Insufficient documentation

## 2021-05-01 DIAGNOSIS — I10 Essential (primary) hypertension: Secondary | ICD-10-CM | POA: Diagnosis not present

## 2021-05-01 DIAGNOSIS — M542 Cervicalgia: Secondary | ICD-10-CM | POA: Diagnosis not present

## 2021-05-01 DIAGNOSIS — E039 Hypothyroidism, unspecified: Secondary | ICD-10-CM | POA: Diagnosis not present

## 2021-05-01 DIAGNOSIS — Z79899 Other long term (current) drug therapy: Secondary | ICD-10-CM | POA: Diagnosis not present

## 2021-05-01 DIAGNOSIS — S060X0A Concussion without loss of consciousness, initial encounter: Secondary | ICD-10-CM | POA: Diagnosis not present

## 2021-05-01 DIAGNOSIS — S0990XA Unspecified injury of head, initial encounter: Secondary | ICD-10-CM | POA: Diagnosis present

## 2021-05-01 DIAGNOSIS — W19XXXA Unspecified fall, initial encounter: Secondary | ICD-10-CM

## 2021-05-01 DIAGNOSIS — S0083XA Contusion of other part of head, initial encounter: Secondary | ICD-10-CM

## 2021-05-01 DIAGNOSIS — S0003XA Contusion of scalp, initial encounter: Secondary | ICD-10-CM | POA: Diagnosis not present

## 2021-05-01 DIAGNOSIS — S0011XA Contusion of right eyelid and periocular area, initial encounter: Secondary | ICD-10-CM | POA: Diagnosis not present

## 2021-05-01 MED ORDER — ONDANSETRON 4 MG PO TBDP: 4.0000 mg | ORAL_TABLET | Freq: Three times a day (TID) | ORAL | 0 refills | Status: AC | PRN

## 2021-05-01 MED ORDER — ONDANSETRON 4 MG PO TBDP
4.0000 mg | ORAL_TABLET | Freq: Once | ORAL | Status: AC
  Administered 2021-05-01: 4 mg via ORAL
  Filled 2021-05-01: qty 1

## 2021-05-01 NOTE — ED Notes (Signed)
Patient transported to CT 

## 2021-05-01 NOTE — ED Triage Notes (Signed)
Pt bib [facility] from home s/p mechanical fall.  Pt fell approximately 12 hours ago.  Pt reports nausea to EMS.

## 2021-05-01 NOTE — ED Provider Notes (Signed)
[facility] DEPT Provider Note   CSN: [id number] Arrival date & time: 05/01/21  0848     History Chief Complaint  Patient presents with   Ross Becker         QUENTINE RAGIN is a 75 y.o. male presenting to emergency department with a fall yesterday and a facial injury.  The patient reports that he tripped over his back doorstep yesterday and fell directly forward onto his face on a hard floor.  There is no loss of consciousness.  This occurred about 12 hours ago.  He reports he has a very mild headache, reported some nausea today.  He also has chronic neck pain and has a history of cervical fusion, denies new numbness or weakness in his arms or legs.  He has a small bruise on his left elbow with no significant pain.  He denies any pain of the other extremities, pelvis.  He is not on blood thinners.   HPI     Past Medical History:  Diagnosis Date   Arthritis    Balance problems    Cervicalgia    Diverticulitis    Enlarged prostate    GERD (gastroesophageal reflux disease)    Gout    Hypertension    Hypothyroidism    Liver lesion     Patient Active Problem List   Diagnosis Date Noted   S/P arthroscopy of left knee 03/18/2018   Asbestos-induced pleural plaque 03/08/2018   Other meniscus derangements, posterior horn of medial meniscus, left knee    Synovitis of knee    Unilateral primary osteoarthritis, left knee 11/25/2017   Complex tear of medial meniscus of left knee 10/01/2017   Chest pain 12/09/2016   Unstable angina (HCC)    Weakness of right foot 10/07/2014   Right hemiplegia ([place]) 10/06/2014   TIA (transient ischemic attack) 10/06/2014   Essential hypertension 10/06/2014    Past Surgical History:  Procedure Laterality Date   CHOLECYSTECTOMY     HERNIA REPAIR     KNEE ARTHROSCOPY WITH MEDIAL MENISECTOMY Left 02/26/2018   Procedure: LEFT KNEE ARTHROSCOPY WITH PARTIAL MEDIAL MENISCECTOMY, SYNOVECTOMY;  Surgeon: Leandrew Koyanagi, MD;   Location: [facility];  Service: Orthopedics;  Laterality: Left;       Family History  Problem Relation Age of Onset   Brain cancer Mother    Cancer Father     Social History   Tobacco Use   Smoking status: Never   Smokeless tobacco: Never  Vaping Use   Vaping Use: Never used  Substance Use Topics   Alcohol use: Yes    Comment: occasionally   Drug use: No    Home Medications Prior to Admission medications   Medication Sig Start Date End Date Taking? Authorizing Provider  ondansetron (ZOFRAN ODT) 4 MG disintegrating tablet Take 1 tablet (4 mg total) by mouth every 8 (eight) hours as needed for up to 15 doses for nausea or vomiting. 05/01/21  Yes Wyvonnia Dusky, MD  albuterol (VENTOLIN HFA) 108 (90 Base) MCG/ACT inhaler Inhale 1-2 puffs into the lungs every 6 (six) hours as needed for wheezing or shortness of breath. 07/22/19   Long, Wonda Olds, MD  cholecalciferol (VITAMIN D) 1000 UNITS tablet Take 2,000 Units by mouth daily.     [provider]  colchicine 0.6 MG tablet Take 0.6 mg by mouth daily as needed (for gout).     [provider]  cyanocobalamin 1000 MCG tablet Take 1,000 mcg by mouth  daily.     [provider]  enalapril (VASOTEC) 20 MG tablet Take 10 mg by mouth daily.    [provider]  levothyroxine (SYNTHROID, LEVOTHROID) 50 MCG tablet Take 50 mcg by mouth daily before breakfast.    [provider]  loperamide (IMODIUM) 2 MG capsule Take 4 mg by mouth as needed for diarrhea or loose stools.    [provider]  omeprazole (PRILOSEC) 20 MG capsule Take 40 mg by mouth daily.     [provider]  terazosin (HYTRIN) 2 MG capsule Take 8 mg by mouth at bedtime.    [provider]    Allergies    Mushroom extract complex and Penicillins  Review of Systems   Review of Systems  Constitutional:  Negative for chills and fever.  Eyes:  Negative for pain and visual disturbance.   Respiratory:  Negative for cough and shortness of breath.   Cardiovascular:  Negative for chest pain and palpitations.  Gastrointestinal:  Positive for nausea. Negative for abdominal pain and vomiting.  Musculoskeletal:  Positive for neck pain. Negative for arthralgias.  Skin:  Negative for color change and rash.  Neurological:  Positive for headaches. Negative for syncope.  All other systems reviewed and are negative.  Physical Exam Updated Vital Signs BP (!) 138/91 (BP Location: Right Arm)   Pulse 85   Temp 97.8 F (36.6 C) (Oral)   Resp 18   Ht '5\' 11"'$  (1.803 m)   Wt 81.6 kg   SpO2 99%   BMI 25.10 kg/m   Physical Exam Constitutional:      General: He is not in acute distress. HENT:     Head: Normocephalic.     Comments: Right periorbital ecchymosis Minor swelling of bridge of nose Abrasion to right forehead Eyes:     Conjunctiva/sclera: Conjunctivae normal.     Pupils: Pupils are equal, round, and reactive to light.  Cardiovascular:     Rate and Rhythm: Normal rate and regular rhythm.     Pulses: Normal pulses.  Pulmonary:     Effort: Pulmonary effort is normal. No respiratory distress.  Musculoskeletal:        General: No swelling, tenderness or deformity. Normal range of motion.  Skin:    General: Skin is warm and dry.  Neurological:     General: No focal deficit present.     Mental Status: He is alert and oriented to person, place, and time. Mental status is at baseline.  Psychiatric:        Mood and Affect: Mood normal.        Behavior: Behavior normal.    ED Results / Procedures / Treatments   Labs (all labs ordered are listed, but only abnormal results are displayed) Labs Reviewed - No data to display  EKG None  Radiology CT HEAD WO CONTRAST (5MM)  Result Date: 05/01/2021 CLINICAL DATA:  [place] onto face last night, RIGHT frontal abrasion, RIGHT eye bruising, nasal laceration, posterior neck pain, prior cervical spine surgery, hypertension EXAM: CT  HEAD WITHOUT CONTRAST CT CERVICAL SPINE WITHOUT CONTRAST TECHNIQUE: Multidetector CT imaging of the head and cervical spine was performed following the standard protocol without intravenous contrast. Multiplanar CT image reconstructions of the cervical spine were also generated. COMPARISON:  CT head 10/06/2014 FINDINGS: CT HEAD FINDINGS Brain: Mild age-related atrophy. Normal ventricular morphology. No midline shift or mass effect. Minimal small vessel chronic ischemic changes of deep cerebral white matter. No intracranial hemorrhage, mass lesion, or evidence of  acute infarction. No extra-axial fluid collections. Vascular: No hyperdense vessels Skull: RIGHT frontal scalp hematoma.  Calvaria intact. Sinuses/Orbits: Clear Other: N/A CT CERVICAL SPINE FINDINGS Alignment: Normal Skull base and vertebrae: Osseous demineralization. Multilevel disc space narrowing and scattered endplate spurs. Scattered mild facet degenerative changes. Vertebral body heights maintained. No fracture, subluxation, or bone destruction. Uncovertebral spurs encroach upon BILATERAL C5-C6 and LEFT C4-C5 neural foramina. Soft tissues and spinal canal: Prevertebral soft tissues normal thickness. Soft tissues otherwise unremarkable. Disc levels:  No specific abnormalities Upper chest: Tiny subpleural nodule posterior LEFT upper lobe image 79 unchanged since 08/05/2019 lung apices otherwise clear. Other: N/A IMPRESSION: Atrophy with minimal small vessel chronic ischemic changes of deep cerebral white matter. No acute intracranial abnormalities. Multilevel degenerative disc and facet disease changes of the cervical spine. Encroachment upon BILATERAL C5-C6 and LEFT C4-C5 neural foramina by uncovertebral spurs. No acute cervical spine abnormalities. Electronically Signed   By: Lavonia Dana M.D.   On: 05/01/2021 10:07   CT Cervical Spine Wo Contrast  Result Date: 05/01/2021 CLINICAL DATA:  [place] onto face last night, RIGHT frontal abrasion, RIGHT eye  bruising, nasal laceration, posterior neck pain, prior cervical spine surgery, hypertension EXAM: CT HEAD WITHOUT CONTRAST CT CERVICAL SPINE WITHOUT CONTRAST TECHNIQUE: Multidetector CT imaging of the head and cervical spine was performed following the standard protocol without intravenous contrast. Multiplanar CT image reconstructions of the cervical spine were also generated. COMPARISON:  CT head 10/06/2014 FINDINGS: CT HEAD FINDINGS Brain: Mild age-related atrophy. Normal ventricular morphology. No midline shift or mass effect. Minimal small vessel chronic ischemic changes of deep cerebral white matter. No intracranial hemorrhage, mass lesion, or evidence of acute infarction. No extra-axial fluid collections. Vascular: No hyperdense vessels Skull: RIGHT frontal scalp hematoma.  Calvaria intact. Sinuses/Orbits: Clear Other: N/A CT CERVICAL SPINE FINDINGS Alignment: Normal Skull base and vertebrae: Osseous demineralization. Multilevel disc space narrowing and scattered endplate spurs. Scattered mild facet degenerative changes. Vertebral body heights maintained. No fracture, subluxation, or bone destruction. Uncovertebral spurs encroach upon BILATERAL C5-C6 and LEFT C4-C5 neural foramina. Soft tissues and spinal canal: Prevertebral soft tissues normal thickness. Soft tissues otherwise unremarkable. Disc levels:  No specific abnormalities Upper chest: Tiny subpleural nodule posterior LEFT upper lobe image 79 unchanged since 08/05/2019 lung apices otherwise clear. Other: N/A IMPRESSION: Atrophy with minimal small vessel chronic ischemic changes of deep cerebral white matter. No acute intracranial abnormalities. Multilevel degenerative disc and facet disease changes of the cervical spine. Encroachment upon BILATERAL C5-C6 and LEFT C4-C5 neural foramina by uncovertebral spurs. No acute cervical spine abnormalities. Electronically Signed   By: Lavonia Dana M.D.   On: 05/01/2021 10:07    Procedures Procedures    Medications Ordered in ED Medications  ondansetron (ZOFRAN-ODT) disintegrating tablet 4 mg (4 mg Oral Given 05/01/21 [id number])    ED Course  I have reviewed the triage vital signs and the nursing notes.  Pertinent labs & imaging results that were available during my care of the patient were reviewed by me and considered in my medical decision making (see chart for details).  Patient is here with a mechanical fall that occurred yesterday evening and isolated head trauma.  We will obtain a CT scan of the brain and cervical spine.  He has no neurological deficits.  He has no focal tenderness of the extremities or pelvis.  He is mentating well.  He reports nausea.  We can give him some Zofran.  Clinical Course as of 05/01/21 1811  Mon May 01, 2021  1022 IMPRESSION: Atrophy with minimal small vessel chronic ischemic changes of deep cerebral white matter.   No acute intracranial abnormalities.   Multilevel degenerative disc and facet disease changes of the cervical spine.   Encroachment upon BILATERAL C5-C6 and LEFT C4-C5 neural foramina by uncovertebral spurs.   No acute cervical spine abnormalities.  [MT]  1022 He has good strength in the upper extremities, no evidence of significant nerve impingement.  Advised that he follow-up with his neurosurgeon regarding the bilateral C5-C6 and left C4-C5 foramina encroachment  [MT]  1023 Otherwise okay for discharge [MT]    Clinical Course User Index [MT] Langston Masker Carola Rhine, MD   Nausea improved with zofran   Final Clinical Impression(s) / ED Diagnoses Final diagnoses:  Contusion of face, initial encounter  Fall, initial encounter  Concussion without loss of consciousness, initial encounter    Rx / DC Orders ED Discharge Orders          Ordered    ondansetron (ZOFRAN ODT) 4 MG disintegrating tablet  Every 8 hours PRN        05/01/21 1042             Wyvonnia Dusky, MD 05/01/21 1812

## 2022-10-20 ENCOUNTER — Other Ambulatory Visit: Payer: Self-pay

## 2022-10-20 ENCOUNTER — Emergency Department (HOSPITAL_COMMUNITY)
Admission: EM | Admit: 2022-10-20 | Discharge: 2022-10-21 | Disposition: A | Discharge status: Home / Self Care | Payer: No Typology Code available for payment source | Attending: Emergency Medicine | Admitting: Emergency Medicine

## 2022-10-20 ENCOUNTER — Emergency Department (HOSPITAL_COMMUNITY): Payer: No Typology Code available for payment source

## 2022-10-20 ENCOUNTER — Encounter (HOSPITAL_COMMUNITY): Payer: Self-pay

## 2022-10-20 DIAGNOSIS — K1120 Sialoadenitis, unspecified: Secondary | ICD-10-CM | POA: Insufficient documentation

## 2022-10-20 DIAGNOSIS — K047 Periapical abscess without sinus: Secondary | ICD-10-CM | POA: Diagnosis not present

## 2022-10-20 DIAGNOSIS — R220 Localized swelling, mass and lump, head: Secondary | ICD-10-CM | POA: Diagnosis not present

## 2022-10-20 DIAGNOSIS — Z79899 Other long term (current) drug therapy: Secondary | ICD-10-CM | POA: Insufficient documentation

## 2022-10-20 DIAGNOSIS — K0889 Other specified disorders of teeth and supporting structures: Secondary | ICD-10-CM | POA: Diagnosis present

## 2022-10-20 LAB — I-STAT CHEM 8, ED
BUN: 13 mg/dL (ref 8–23)
Calcium, Ion: 1.19 mmol/L (ref 1.15–1.40)
Chloride: 101 mmol/L (ref 98–111)
Creatinine, Ser: 1.2 mg/dL (ref 0.61–1.24)
Glucose, Bld: 95 mg/dL (ref 70–99)
HCT: 44 % (ref 39.0–52.0)
Hemoglobin: 15 g/dL (ref 13.0–17.0)
Potassium: 4.3 mmol/L (ref 3.5–5.1)
Sodium: 138 mmol/L (ref 135–145)
TCO2: 24 mmol/L (ref 22–32)

## 2022-10-20 MED ORDER — HYDROCODONE-ACETAMINOPHEN 5-325 MG PO TABS
1.0000 | ORAL_TABLET | Freq: Once | ORAL | Status: AC
  Administered 2022-10-20: 1 via ORAL
  Filled 2022-10-20: qty 1

## 2022-10-20 MED ORDER — OXYCODONE-ACETAMINOPHEN 5-325 MG PO TABS: 1.0000 | ORAL_TABLET | Freq: Four times a day (QID) | ORAL | 0 refills | Status: DC | PRN

## 2022-10-20 MED ORDER — IBUPROFEN 200 MG PO TABS
600.0000 mg | ORAL_TABLET | Freq: Once | ORAL | Status: AC
  Administered 2022-10-20: 600 mg via ORAL
  Filled 2022-10-20: qty 3

## 2022-10-20 MED ORDER — AMOXICILLIN-POT CLAVULANATE 875-125 MG PO TABS: 1.0000 | ORAL_TABLET | Freq: Two times a day (BID) | ORAL | 0 refills | Status: DC

## 2022-10-20 MED ORDER — IOHEXOL 300 MG/ML  SOLN
75.0000 mL | Freq: Once | INTRAMUSCULAR | Status: AC | PRN
  Administered 2022-10-20: 75 mL via INTRAVENOUS

## 2022-10-20 MED ORDER — AMOXICILLIN-POT CLAVULANATE 875-125 MG PO TABS
1.0000 | ORAL_TABLET | Freq: Once | ORAL | Status: AC
  Administered 2022-10-20: 1 via ORAL
  Filled 2022-10-20: qty 1

## 2022-10-20 NOTE — ED Provider Notes (Signed)
 [facility] EMERGENCY DEPARTMENT AT [facility] Provider Note   CSN: [id number] Arrival date & time: 10/20/22  1814     History Chief Complaint  Patient presents with   Dental Pain    GAYLAN FAUVER is a 77 y.o. male.  Patient with past medical history significant for unstable angina, TIA presents to the emergency department with complaint of dental pain.  He reports the pain is a present for the last 3 days on a tooth on the left upper jaw.  He states that a few hours ago he also began to have some swelling of the skin in his neck.  Denies any fever, difficulty swallowing, chest tightness, shortness of breath, chest pain.  Patient has tried taking ibuprofen at home without any significant relief in pain.  Patient does not have regular follow-up with a dentist.   Dental Pain      Home Medications Prior to Admission medications   Medication Sig Start Date End Date Taking? Authorizing Provider  amoxicillin-clavulanate (AUGMENTIN) 875-125 MG tablet Take 1 tablet by mouth every 12 (twelve) hours. 10/20/22  Yes Smitty Knudsen, PA-C  oxyCODONE-acetaminophen (PERCOCET/ROXICET) 5-325 MG tablet Take 1 tablet by mouth every 6 (six) hours as needed for severe pain. 10/20/22  Yes Smitty Knudsen, PA-C  albuterol (VENTOLIN HFA) 108 (90 Base) MCG/ACT inhaler Inhale 1-2 puffs into the lungs every 6 (six) hours as needed for wheezing or shortness of breath. 07/22/19   Long, Arlyss Repress, MD  cholecalciferol (VITAMIN D) 1000 UNITS tablet Take 2,000 Units by mouth daily.     [provider]  colchicine 0.6 MG tablet Take 0.6 mg by mouth daily as needed (for gout).     [provider]  cyanocobalamin 1000 MCG tablet Take 1,000 mcg by mouth daily.     [provider]  enalapril (VASOTEC) 20 MG tablet Take 10 mg by mouth daily.    [provider]  levothyroxine (SYNTHROID, LEVOTHROID) 50 MCG tablet Take 50 mcg by mouth daily before breakfast.    [provider]  loperamide (IMODIUM) 2 MG capsule Take 4 mg by mouth as needed for diarrhea or loose stools.    [provider]  omeprazole (PRILOSEC) 20 MG capsule Take 40 mg by mouth daily.     [provider]  ondansetron (ZOFRAN ODT) 4 MG disintegrating tablet Take 1 tablet (4 mg total) by mouth every 8 (eight) hours as needed for up to 15 doses for nausea or vomiting. 05/01/21   Terald Sleeper, MD  terazosin (HYTRIN) 2 MG capsule Take 8 mg by mouth at bedtime.    [provider]      Allergies    Mushroom extract complex and Penicillins    Review of Systems   Review of Systems  HENT:  Positive for dental problem.   All other systems reviewed and are negative.   Physical Exam Updated Vital Signs BP (!) 168/92 (BP Location: Right Arm)   Pulse 80   Temp (!) 97.5 F (36.4 C) (Oral)   Resp 19   Ht 5\' 11"  (1.803 m)   Wt 84.4 kg   SpO2 97%   BMI 25.94 kg/m  Physical Exam Vitals and nursing note reviewed.  Constitutional:      Appearance: Normal appearance.  HENT:     Head: Normocephalic and atraumatic.     Mouth/Throat:     Mouth: Mucous membranes are moist.     Pharynx: No oropharyngeal exudate or  posterior oropharyngeal erythema.     Comments: Some dental caries throughout dentition.  No obvious abscess noted with gum boil but gingival erythema was noted. Neck:     Comments: And large neck mass noted on exam that patient reports began earlier today Musculoskeletal:     Cervical back: Normal range of motion. No tenderness.  Neurological:     Mental Status: He is alert.     ED Results / Procedures / Treatments   Labs (all labs ordered are listed, but only abnormal results are displayed) Labs Reviewed  I-STAT CREATININE, ED  I-STAT CHEM 8, ED    EKG None  Radiology CT Soft Tissue Neck W Contrast  Result Date: 10/20/2022 CLINICAL DATA:  Soft tissue infection suspected EXAM: CT NECK WITH CONTRAST TECHNIQUE: Multidetector CT imaging  of the neck was performed using the standard protocol following the bolus administration of intravenous contrast. RADIATION DOSE REDUCTION: This exam was performed according to the departmental dose-optimization program which includes automated exposure control, adjustment of the mA and/or kV according to patient size and/or use of iterative reconstruction technique. CONTRAST:  75mL OMNIPAQUE IOHEXOL 300 MG/ML  SOLN COMPARISON:  None Available. FINDINGS: PHARYNX AND LARYNX: The nasopharynx, oropharynx and larynx are normal. Visible portions of the oral cavity, tongue base and floor of mouth are normal. Normal epiglottis, vallecula and pyriform sinuses. The larynx is normal. No retropharyngeal abscess, effusion or lymphadenopathy. SALIVARY GLANDS: Inflammatory stranding in the fat surrounding the left submandibular gland. No sialolithiasis. The a left submandibular gland is mildly enlarged and hyperenhancing relative to the right. The other salivary glands are normal. THYROID: Normal. LYMPH NODES: No enlarged or abnormal density lymph nodes. VASCULAR: Major cervical vessels are patent. LIMITED INTRACRANIAL: Normal. VISUALIZED ORBITS: Normal. MASTOIDS AND VISUALIZED PARANASAL SINUSES: Left maxillary sinus mucosal thickening SKELETON: No bony spinal canal stenosis. No lytic or blastic lesions. UPPER CHEST: Clear. OTHER: None. IMPRESSION: Left submandibular sialoadenitis with surrounding inflammatory stranding. No sialolithiasis. Electronically Signed   By: Deatra Robinson M.D.   On: 10/20/2022 22:36    Procedures Procedures   Medications Ordered in ED Medications  HYDROcodone-acetaminophen (NORCO/VICODIN) 5-325 MG per tablet 1 tablet (1 tablet Oral Given 10/20/22 1952)  ibuprofen (ADVIL) tablet 600 mg (600 mg Oral Given 10/20/22 1953)  iohexol (OMNIPAQUE) 300 MG/ML solution 75 mL (75 mLs Intravenous Contrast Given 10/20/22 2142)  amoxicillin-clavulanate (AUGMENTIN) 875-125 MG per tablet 1 tablet (1 tablet Oral  Given 10/20/22 2322)  HYDROcodone-acetaminophen (NORCO/VICODIN) 5-325 MG per tablet 1 tablet (1 tablet Oral Given 10/20/22 2322)    ED Course/ Medical Decision Making/ A&P                           Medical Decision Making Amount and/or Complexity of Data Reviewed Radiology: ordered.  Risk OTC drugs. Prescription drug management.   This patient presents to the ED for concern of dental pain.  Differential diagnosis includes dental caries, dental abscess, broken tooth, gingivitis, sialoadenitis   Imaging Studies ordered:  I ordered imaging studies including CT soft tissue neck I independently visualized and interpreted imaging which showed sialoadenitis I agree with the radiologist interpretation   Medicines ordered and prescription drug management:  I ordered medication including Norco, ibuprofen for pain Reevaluation of the patient after these medicines showed that the patient improved I have reviewed the patients home medicines and have made adjustments as needed   Problem List / ED Course:  Patient presents to the emergency department complaining of  left upper tooth pain over the last 3 days.  Past medical history is significant for unstable angina, TIA.  Patient reports that he thinks he may have been eating an apple when his anemia got stuck and open a space behind his molar on the left upper side.  He denies any obvious purulent discharge.  On exam, patient has multiple teeth with active dental caries and 1 posterior molar appears to be significantly chipped on the left upper side.  Patient reports that this tooth is not an acute finding and he is not recently broken it.  Given exam I believe is necessary to proceed with antibiotic therapy.  Patient reports that he has had a previous childhood allergy to penicillin with a rash around the age of 42 and he has not taken any penicillin or penicillin family medication since then.  Discussed pros and cons of different antibiotic  therapies including risk of C. difficile with the administration of clindamycin as other penicillin-based medications are likely caused some reaction to patient.  After discussion, was determined that we would try a dose of Augmentin here in the emergency department to determine if patient has a true penicillin allergy due to the risk of C. difficile with clindamycin.  During my time observing the patient for approximately half an hour, no evidence of any erythema, difficulty breathing, throat swelling, generalized itchiness was seen and I believe that patient does not have a true allergy to penicillins.  Prescription for Augmentin sent to patient's pharmacy as well as Percocet for pain management.  Encourage patient to suck on sour candies for the cellulitis that he is been diagnosed with based on CT imaging as this will likely help facilitate saliva production and removal of the obstruction in the salivary gland.  Encourage patient return back to the emergency department follow-up with primary care provider he begins experiencing severe fevers, erythema or redness around the area of his teeth or in the submandibular space.  Patient was agreeable to treatment plan verbalized understanding all return precautions.  All questions answered prior to patient discharge.  Final Clinical Impression(s) / ED Diagnoses Final diagnoses:  Dental abscess  Sialadenitis    Rx / DC Orders ED Discharge Orders          Ordered    amoxicillin-clavulanate (AUGMENTIN) 875-125 MG tablet  Every 12 hours        10/20/22 2345    oxyCODONE-acetaminophen (PERCOCET/ROXICET) 5-325 MG tablet  Every 6 hours PRN        10/20/22 2345              Smitty Knudsen, PA-C 10/21/22 0024    Wynetta Fines, MD 10/21/22 [phone]

## 2022-10-20 NOTE — Discharge Instructions (Addendum)
Seen in the emergency department for a dental abscess and sialoadenitis.  I prescribed some antibiotics and pain medication for your dental abscess and pain.  Sialadenitis is essentially a stone that is stuck inside a salivary gland which you can facilitate this being removed more quickly by sucking on sour candy or food as this will increase the saliva production and push the stone out.  He began to experience any fevers, redness of the jaw, please return to the emergency department or see your primary care provider for further evaluation as this could be signs of an infection in the salivary gland.

## 2022-10-20 NOTE — ED Triage Notes (Signed)
Patient said he began having left upper tooth pain for 3 days. Then a couple hours ago he began having skin in his neck swell. Taking ibuprofen without relief.

## 2022-10-20 NOTE — Progress Notes (Signed)
Patient needs labs for CT scan please.

## 2023-04-18 ENCOUNTER — Encounter (HOSPITAL_COMMUNITY): Payer: Self-pay

## 2023-04-18 ENCOUNTER — Emergency Department (HOSPITAL_COMMUNITY)
Admission: EM | Admit: 2023-04-18 | Discharge: 2023-04-18 | Disposition: A | Discharge status: Home / Self Care | Payer: No Typology Code available for payment source | Attending: Emergency Medicine | Admitting: Emergency Medicine

## 2023-04-18 DIAGNOSIS — M79641 Pain in right hand: Secondary | ICD-10-CM | POA: Diagnosis present

## 2023-04-18 DIAGNOSIS — M109 Gout, unspecified: Secondary | ICD-10-CM | POA: Insufficient documentation

## 2023-04-18 LAB — CBC WITH DIFFERENTIAL/PLATELET
Abs Immature Granulocytes: 0.03 10*3/uL (ref 0.00–0.07)
Basophils Absolute: 0.1 10*3/uL (ref 0.0–0.1)
Basophils Relative: 1 %
Eosinophils Absolute: 0.1 10*3/uL (ref 0.0–0.5)
Eosinophils Relative: 1 %
HCT: 40 % (ref 39.0–52.0)
Hemoglobin: 14.3 g/dL (ref 13.0–17.0)
Immature Granulocytes: 0 %
Lymphocytes Relative: 19 %
Lymphs Abs: 2 10*3/uL (ref 0.7–4.0)
MCH: 34.8 pg — ABNORMAL HIGH (ref 26.0–34.0)
MCHC: 35.8 g/dL (ref 30.0–36.0)
MCV: 97.3 fL (ref 80.0–100.0)
Monocytes Absolute: 0.9 10*3/uL (ref 0.1–1.0)
Monocytes Relative: 8 %
Neutro Abs: 7.8 10*3/uL — ABNORMAL HIGH (ref 1.7–7.7)
Neutrophils Relative %: 71 %
Platelets: 215 10*3/uL (ref 150–400)
RBC: 4.11 MIL/uL — ABNORMAL LOW (ref 4.22–5.81)
RDW: 11.3 % — ABNORMAL LOW (ref 11.5–15.5)
WBC: 10.9 10*3/uL — ABNORMAL HIGH (ref 4.0–10.5)
nRBC: 0 % (ref 0.0–0.2)

## 2023-04-18 LAB — BASIC METABOLIC PANEL
Anion gap: 8 (ref 5–15)
BUN: 16 mg/dL (ref 8–23)
CO2: 23 mmol/L (ref 22–32)
Calcium: 9.1 mg/dL (ref 8.9–10.3)
Chloride: 106 mmol/L (ref 98–111)
Creatinine, Ser: 0.96 mg/dL (ref 0.61–1.24)
GFR, Estimated: >60 mL/min (ref >60)
Glucose, Bld: 92 mg/dL (ref 70–99)
Potassium: 4.1 mmol/L (ref 3.5–5.1)
Sodium: 137 mmol/L (ref 135–145)

## 2023-04-18 MED ORDER — PREDNISONE 10 MG PO TABS: 50.0000 mg | ORAL_TABLET | Freq: Every day | ORAL | 0 refills | Status: DC

## 2023-04-18 MED ORDER — COLCHICINE 0.6 MG PO TABS
1.2000 mg | ORAL_TABLET | Freq: Once | ORAL | Status: AC
  Administered 2023-04-18: 1.2 mg via ORAL
  Filled 2023-04-18: qty 2

## 2023-04-18 MED ORDER — COLCHICINE 0.6 MG PO TABS: 0.6000 mg | ORAL_TABLET | Freq: Two times a day (BID) | ORAL | 0 refills | Status: DC

## 2023-04-18 NOTE — Discharge Instructions (Signed)
The hand surgery team thinks that most likely this is a gout attack.  They want you to start taking colchicine and prednisone.  Please follow-up with the hand surgeon in their clinic by calling the number provided and setting up an appointment for Monday.

## 2023-04-18 NOTE — ED Provider Notes (Signed)
[place] EMERGENCY DEPARTMENT AT [facility] Provider Note   CSN: [id number] Arrival date & time: 04/18/23  1102     History  Chief Complaint  Patient presents with   Hand Pain    Ross Becker is a 77 y.o. male.  HPI    77 year old male comes in with chief complaint of swelling over the right middle finger.  Patient indicates that he had swelling to that finger about 2 weeks ago.  It persisted for 2 days and then resolved.  However it returned a few days back and has worsened.  Patient has significant discomfort with movement of the finger.  Patient has past medical history of gout, but has never had gouty attack of the digits.  He went to the [facility] urgent care.  He had x-ray of his finger done which did not reveal any fracture.  He was advised to come to the ER for further assessment.  Patient has a small puncture wound to the dorsum of the same digit and he indicates that he had an injury with washing machine right before the symptoms of swelling began in his finger.  Patient denies any fevers, chills.  Home Medications Prior to Admission medications   Medication Sig Start Date End Date Taking? Authorizing Provider  colchicine 0.6 MG tablet Take 1 tablet (0.6 mg total) by mouth 2 (two) times daily. 04/18/23  Yes Derwood Kaplan, MD  predniSONE (DELTASONE) 10 MG tablet Take 5 tablets (50 mg total) by mouth daily. 04/18/23  Yes Derwood Kaplan, MD  albuterol (VENTOLIN HFA) 108 (90 Base) MCG/ACT inhaler Inhale 1-2 puffs into the lungs every 6 (six) hours as needed for wheezing or shortness of breath. 07/22/19   Long, Arlyss Repress, MD  amoxicillin-clavulanate (AUGMENTIN) 875-125 MG tablet Take 1 tablet by mouth every 12 (twelve) hours. 10/20/22   Smitty Knudsen, PA-C  cholecalciferol (VITAMIN D) 1000 UNITS tablet Take 2,000 Units by mouth daily.     [provider]  cyanocobalamin 1000 MCG tablet Take 1,000 mcg by mouth daily.     [provider]  enalapril (VASOTEC) 20 MG tablet Take 10 mg by mouth daily.    [provider]  levothyroxine (SYNTHROID, LEVOTHROID) 50 MCG tablet Take 50 mcg by mouth daily before breakfast.    [provider]  loperamide (IMODIUM) 2 MG capsule Take 4 mg by mouth as needed for diarrhea or loose stools.    [provider]  omeprazole (PRILOSEC) 20 MG capsule Take 40 mg by mouth daily.     [provider]  ondansetron (ZOFRAN ODT) 4 MG disintegrating tablet Take 1 tablet (4 mg total) by mouth every 8 (eight) hours as needed for up to 15 doses for nausea or vomiting. 05/01/21   Terald Sleeper, MD  oxyCODONE-acetaminophen (PERCOCET/ROXICET) 5-325 MG tablet Take 1 tablet by mouth every 6 (six) hours as needed for severe pain. 10/20/22   Smitty Knudsen, PA-C  terazosin (HYTRIN) 2 MG capsule Take 8 mg by mouth at bedtime.    [provider]      Allergies    Mushroom extract complex and Penicillins    Review of Systems   Review of Systems  All other systems reviewed and are negative.   Physical Exam Updated Vital Signs BP 139/87 (BP Location: Right Arm)   Pulse 77   Temp 98.6 F (37 C) (Oral)   Resp 18   Ht 5\' 11"  (1.803 m)   Wt 83.9  kg   SpO2 99%   BMI 25.80 kg/m  Physical Exam Vitals and nursing note reviewed.  Constitutional:      Appearance: He is well-developed.  HENT:     Head: Atraumatic.  Cardiovascular:     Rate and Rhythm: Normal rate.  Pulmonary:     Effort: Pulmonary effort is normal.  Musculoskeletal:        General: Swelling, tenderness and deformity present.     Cervical back: Neck supple.  Skin:    General: Skin is warm.  Neurological:     Mental Status: He is alert and oriented to person, place, and time.          ED Results / Procedures / Treatments   Labs (all labs ordered are listed, but only abnormal results are displayed) Labs Reviewed  CBC WITH DIFFERENTIAL/PLATELET - Abnormal; Notable for the  following components:      Result Value   WBC 10.9 (*)    RBC 4.11 (*)    MCH 34.8 (*)    RDW 11.3 (*)    Neutro Abs 7.8 (*)    All other components within normal limits  BASIC METABOLIC PANEL    EKG None  Radiology No results found.  Procedures Procedures    Medications Ordered in ED Medications  colchicine tablet 1.2 mg (1.2 mg Oral Given 04/18/23 1614)    ED Course/ Medical Decision Making/ A&P Clinical Course as of 04/19/23 0825  Thu Apr 18, 2023  1602 Dr. Izora Ribas has seen the patient.  He recommends that we should treat the patient as gout.  He will see the patient in his clinic on Monday if not getting better. [AN]    Clinical Course User Index [AN] Derwood Kaplan, MD                                 Medical Decision Making Amount and/or Complexity of Data Reviewed Labs: ordered.  Risk Prescription drug management.  77 year old male comes in with chief complaint of right long finger swelling and pain.  Symptoms are unusual and that he has started noticing some swelling 2 weeks ago, after an injury while closing a washing machine.  That swelling subsided on its own.  However the symptoms returned few days back.  He has history of gout, but no previous gout in his digit.  No recent gout attack even.  On exam, patient has a warm right PIP and the swelling goes towards the MCP.  Range of motion is severely compromised.  There is also erythema.  Patient has no evidence of felon or paronychia.  I am concerned that patient might likely have joint abscess versus finger abscess.  Gout thought to be less likely at this time.  I consulted Dr. Izora Ribas, Hand Surgery to see the patient.  Final Clinical Impression(s) / ED Diagnoses Final diagnoses:  Acute gout of right hand, unspecified cause    Rx / DC Orders ED Discharge Orders          Ordered    predniSONE (DELTASONE) 10 MG tablet  Daily        04/18/23 1603    colchicine 0.6 MG tablet  2 times daily        04/18/23  1605              Derwood Kaplan, MD 04/19/23 [phone]

## 2023-04-18 NOTE — ED Triage Notes (Signed)
Pt has a swollen PIP joint in the middle finger with no real trauma occuring, has went to the [facility], coming in due to progression in the swelling. Pt has feeling in the tip of the finger with good cap refill but the joint itself is slightly warm to the touch

## 2023-04-18 NOTE — Consult Note (Signed)
Reason for Consult:right long finger swelling Referring Physician: ER  CC:My finger is swollen and hurts to move  HPI:  Ross Becker is an 77 y.o. right handed male who presents with   pain, swelling, redness of RLF.  Pt states ~2wks ago pt's RLF and dorsal hand became swollen, denies any significant trauma except for small abrasion on dorsal distal LF.  Pt states this swelling resolved without any treatment in ~ 2 days.  Pt states RLF became swollen, red again ~7 days ago.  Pt was seen at [facility] clinic, XRays taken, blood work done, no treatment given, was referred to [facility] ER.  Pt states he took 2 colchicine pills when finger was swollen and this did nothing.  Has a history of gout, never in finger before.  Not on any preventative gout medicine.  Pt reports that uric acid level was normal recently, but I do not see this value.     .   Pain is rated at   7 /10 and is described as sharp/dull.  Pain is constant.  Pain is made better by rest/immobilization, worse with motion.   Associated signs/symptoms:  only finger involved this episode, previous with dorsal hand Previous treatment:  h/o gout  Past Medical History:  Diagnosis Date   Arthritis    Balance problems    Cervicalgia    Diverticulitis    Enlarged prostate    GERD (gastroesophageal reflux disease)    Gout    Hypertension    Hypothyroidism    Liver lesion     Past Surgical History:  Procedure Laterality Date   CHOLECYSTECTOMY     HERNIA REPAIR     KNEE ARTHROSCOPY WITH MEDIAL MENISECTOMY Left 02/26/2018   Procedure: LEFT KNEE ARTHROSCOPY WITH PARTIAL MEDIAL MENISCECTOMY, SYNOVECTOMY;  Surgeon: Tarry Kos, MD;  Location: [facility];  Service: Orthopedics;  Laterality: Left;    Family History  Problem Relation Age of Onset   Brain cancer Mother    Cancer Father     Social History:  reports that he has never smoked. He has never used smokeless tobacco. He reports current alcohol use. He reports that he does  not use drugs.  Allergies:  Allergies  Allergen Reactions   Mushroom Extract Complex     Avoids due to gout   Penicillins Other (See Comments)    "childhood allergy"    Medications: I have reviewed the patient's current medications.  Results for orders placed or performed during the hospital encounter of 04/18/23 (from the past 48 hour(s))  Basic metabolic panel     Status: None   Collection Time: 04/18/23 11:34 AM  Result Value Ref Range   Sodium 137 135 - 145 mmol/L   Potassium 4.1 3.5 - 5.1 mmol/L   Chloride 106 98 - 111 mmol/L   CO2 23 22 - 32 mmol/L   Glucose, Bld 92 70 - 99 mg/dL    Comment: Glucose reference range applies only to samples taken after fasting for at least 8 hours.   BUN 16 8 - 23 mg/dL   Creatinine, Ser 0.98 0.61 - 1.24 mg/dL   Calcium 9.1 8.9 - 11.9 mg/dL   GFR, Estimated >14 >78 mL/min    Comment: (NOTE) Calculated using the CKD-EPI Creatinine Equation (2021)    Anion gap 8 5 - 15    Comment: Performed at [facility], [address], [place], [place] [address]  CBC with Differential     Status: Abnormal  Collection Time: 04/18/23 11:34 AM  Result Value Ref Range   WBC 10.9 (H) 4.0 - 10.5 K/uL   RBC 4.11 (L) 4.22 - 5.81 MIL/uL   Hemoglobin 14.3 13.0 - 17.0 g/dL   HCT 16.1 09.6 - 04.5 %   MCV 97.3 80.0 - 100.0 fL   MCH 34.8 (H) 26.0 - 34.0 pg   MCHC 35.8 30.0 - 36.0 g/dL   RDW 40.9 (L) 81.1 - 91.4 %   Platelets 215 150 - 400 K/uL   nRBC 0.0 0.0 - 0.2 %   Neutrophils Relative % 71 %   Neutro Abs 7.8 (H) 1.7 - 7.7 K/uL   Lymphocytes Relative 19 %   Lymphs Abs 2.0 0.7 - 4.0 K/uL   Monocytes Relative 8 %   Monocytes Absolute 0.9 0.1 - 1.0 K/uL   Eosinophils Relative 1 %   Eosinophils Absolute 0.1 0.0 - 0.5 K/uL   Basophils Relative 1 %   Basophils Absolute 0.1 0.0 - 0.1 K/uL   Immature Granulocytes 0 %   Abs Immature Granulocytes 0.03 0.00 - 0.07 K/uL    Comment: Performed at [facility], [address], [place], [place]  [address]    No results found.  Pertinent items are noted in HPI. Temp:  [98.5 F (36.9 C)-98.6 F (37 C)] 98.6 F (37 C) (08/22 1500) Pulse Rate:  [75-92] 77 (08/22 1500) Resp:  [18] 18 (08/22 1500) BP: (136-169)/(75-121) 139/87 (08/22 1500) SpO2:  [92 %-99 %] 99 % (08/22 1500) Weight:  [83.9 kg] 83.9 kg (08/22 1123) General appearance: alert and cooperative Resp: clear to auscultation bilaterally Cardio: regular rate and rhythm Extremities: extremities normal, atraumatic, no cyanosis or edema Except for RLF with diffuse swelling around pipj, redness, held in sl flexed position, no obvious subcutaneous abscess, volar tendon sheath pain free, tiny abrasion over middle phalanx dorsally without significant erythema  Assessment: I believe this is gout Plan: Would treat for acute gout, if no better in 24-48 hours would consider exploration of pipj / wash out. I have discussed this treatment plan in detail with patient, including the risks of the recommended treatment or surgery, the benefits and the alternatives.  The patient understands that additional treatment may be necessary.  Johnette Abraham 04/18/2023, 3:47 PM

## 2023-04-18 NOTE — ED Notes (Signed)
Awaiting patient from lobby 

## 2024-04-08 ENCOUNTER — Emergency Department (HOSPITAL_COMMUNITY)
Admission: EM | Admit: 2024-04-08 | Discharge: 2024-04-09 | Disposition: A | Discharge status: Home / Self Care | Attending: Emergency Medicine | Admitting: Emergency Medicine

## 2024-04-08 ENCOUNTER — Other Ambulatory Visit: Payer: Self-pay

## 2024-04-08 ENCOUNTER — Emergency Department (HOSPITAL_COMMUNITY)

## 2024-04-08 ENCOUNTER — Encounter (HOSPITAL_COMMUNITY): Payer: Self-pay | Admitting: Emergency Medicine

## 2024-04-08 DIAGNOSIS — Z79899 Other long term (current) drug therapy: Secondary | ICD-10-CM | POA: Insufficient documentation

## 2024-04-08 DIAGNOSIS — I10 Essential (primary) hypertension: Secondary | ICD-10-CM | POA: Insufficient documentation

## 2024-04-08 DIAGNOSIS — S50811A Abrasion of right forearm, initial encounter: Secondary | ICD-10-CM | POA: Insufficient documentation

## 2024-04-08 DIAGNOSIS — E039 Hypothyroidism, unspecified: Secondary | ICD-10-CM | POA: Insufficient documentation

## 2024-04-08 DIAGNOSIS — W19XXXA Unspecified fall, initial encounter: Secondary | ICD-10-CM | POA: Insufficient documentation

## 2024-04-08 DIAGNOSIS — M064 Inflammatory polyarthropathy: Secondary | ICD-10-CM | POA: Insufficient documentation

## 2024-04-08 DIAGNOSIS — M1990 Unspecified osteoarthritis, unspecified site: Secondary | ICD-10-CM

## 2024-04-08 DIAGNOSIS — M25561 Pain in right knee: Secondary | ICD-10-CM | POA: Diagnosis not present

## 2024-04-08 DIAGNOSIS — M545 Low back pain, unspecified: Secondary | ICD-10-CM | POA: Insufficient documentation

## 2024-04-08 DIAGNOSIS — S59911A Unspecified injury of right forearm, initial encounter: Secondary | ICD-10-CM | POA: Diagnosis present

## 2024-04-08 MED ORDER — PREDNISONE 10 MG PO TABS: 40.0000 mg | ORAL_TABLET | Freq: Every day | ORAL | 0 refills | Status: DC

## 2024-04-08 MED ORDER — PREDNISONE 20 MG PO TABS
60.0000 mg | ORAL_TABLET | Freq: Once | ORAL | Status: AC
  Administered 2024-04-08 (×2): 60 mg via ORAL
  Filled 2024-04-08: qty 3

## 2024-04-08 MED ORDER — HYDROCODONE-ACETAMINOPHEN 5-325 MG PO TABS
1.0000 | ORAL_TABLET | Freq: Once | ORAL | Status: AC
  Administered 2024-04-08 (×2): 1 via ORAL
  Filled 2024-04-08: qty 1

## 2024-04-08 MED ORDER — HYDROCODONE-ACETAMINOPHEN 5-325 MG PO TABS: 1.0000 | ORAL_TABLET | Freq: Four times a day (QID) | ORAL | 0 refills | Status: DC | PRN

## 2024-04-08 NOTE — ED Triage Notes (Signed)
 Pt to ED via [facility] from home c/o right knee swelling x4 days.  Unable to bear much weight or bend knee.  States hx of gout but feels different.  Also states did trip today and fell backwards, not hitting head, denies LOC or blood thinners.  Pt has small skin tear to right forearm after fall.  Pulses and sensation intact.

## 2024-04-08 NOTE — Discharge Instructions (Addendum)
 Follow-up with the [facility].  Take the prednisone  as directed for the next 5 days.  Take the pain medicine as needed.  Wear the knee immobilizer for comfort this will help stabilize your knee while it is healing.  As we discussed suspect that this is probably gout inflammatory arthritis.

## 2024-04-08 NOTE — ED Notes (Signed)
 [facility] called to take pt. back to his home.

## 2024-04-08 NOTE — ED Provider Notes (Signed)
 [place] EMERGENCY DEPARTMENT AT [facility] Provider Note   CSN: [id number] Arrival date & time: 04/08/24  1945     Patient presents with: Joint Swelling   Ross Becker is a 77 y.o. male.   Patient with swelling and pain to the right knee for more than week.  They originally said 4 days.  Not able to put much weight on it.  Had gout before but this does feel little bit different.  Did have a fall earlier today with a skin tear to the right forearm no other injuries.  Had a little bit of soreness in the lumbar back.  But no marks no tenderness to palpation.  Did not hit his head no loss of consciousness.  Patient is followed at the [facility].  Past medical history sniffing for gout gastroesophageal reflux disease hypertension hypothyroidism diverticulitis.  Patient had his gallbladder removed.  Had knee arthrosis [place] with medial meniscal be on the left.  This is the right knee that is bothering him but never used tobacco products.  Has had gout in his right hand in August a year ago.       Prior to Admission medications   Medication Sig Start Date End Date Taking? Authorizing Provider  HYDROcodone -acetaminophen  (NORCO/VICODIN) 5-325 MG tablet Take 1 tablet by mouth every 6 (six) hours as needed for moderate pain (pain score 4-6). 04/08/24  Yes Elizabelle Fite, MD  predniSONE  (DELTASONE ) 10 MG tablet Take 4 tablets (40 mg total) by mouth daily. 04/08/24  Yes Amarylis Rovito, MD  albuterol  (VENTOLIN  HFA) 108 (90 Base) MCG/ACT inhaler Inhale 1-2 puffs into the lungs every 6 (six) hours as needed for wheezing or shortness of breath. 07/22/19   Long, Joshua G, MD  amoxicillin -clavulanate (AUGMENTIN ) 875-125 MG tablet Take 1 tablet by mouth every 12 (twelve) hours. 10/20/22   Zelaya, Oscar A, PA-C  cholecalciferol (VITAMIN D) 1000 UNITS tablet Take 2,000 Units by mouth daily.     [provider]  colchicine  0.6 MG tablet Take 1 tablet (0.6 mg total) by mouth  2 (two) times daily. 04/18/23   Nanavati, Ankit, MD  cyanocobalamin 1000 MCG tablet Take 1,000 mcg by mouth daily.     [provider]  enalapril  (VASOTEC ) 20 MG tablet Take 10 mg by mouth daily.    [provider]  levothyroxine  (SYNTHROID , LEVOTHROID) 50 MCG tablet Take 50 mcg by mouth daily before breakfast.    [provider]  loperamide (IMODIUM) 2 MG capsule Take 4 mg by mouth as needed for diarrhea or loose stools.    [provider]  omeprazole (PRILOSEC) 20 MG capsule Take 40 mg by mouth daily.     [provider]  ondansetron  (ZOFRAN  ODT) 4 MG disintegrating tablet Take 1 tablet (4 mg total) by mouth every 8 (eight) hours as needed for up to 15 doses for nausea or vomiting. 05/01/21   Cottie Donnice PARAS, MD  oxyCODONE -acetaminophen  (PERCOCET/ROXICET) 5-325 MG tablet Take 1 tablet by mouth every 6 (six) hours as needed for severe pain. 10/20/22   Zelaya, Oscar A, PA-C  predniSONE  (DELTASONE ) 10 MG tablet Take 5 tablets (50 mg total) by mouth daily. 04/18/23   Charlyn Sora, MD  terazosin  (HYTRIN ) 2 MG capsule Take 8 mg by mouth at bedtime.    [provider]    Allergies: Mushroom extract complex (obsolete) and Penicillins    Review of Systems  Constitutional:  Negative for chills and fever.  HENT:  Negative for  ear pain and sore throat.   Eyes:  Negative for pain and visual disturbance.  Respiratory:  Negative for cough and shortness of breath.   Cardiovascular:  Negative for chest pain and palpitations.  Gastrointestinal:  Negative for abdominal pain and vomiting.  Genitourinary:  Negative for dysuria and hematuria.  Musculoskeletal:  Positive for joint swelling. Negative for arthralgias and back pain.  Skin:  Negative for color change and rash.  Neurological:  Negative for seizures and syncope.  All other systems reviewed and are negative.   Updated Vital Signs BP 129/70   Pulse 89   Temp 98.1 F (36.7 C)   Resp 16   Ht  1.803 m (5' 11)   Wt 74.4 kg   SpO2 97%   BMI 22.87 kg/m   Physical Exam Vitals and nursing note reviewed.  Constitutional:      General: He is not in acute distress.    Appearance: Normal appearance. He is well-developed.  HENT:     Head: Normocephalic and atraumatic.     Mouth/Throat:     Mouth: Mucous membranes are moist.  Eyes:     Extraocular Movements: Extraocular movements intact.     Conjunctiva/sclera: Conjunctivae normal.     Pupils: Pupils are equal, round, and reactive to light.  Cardiovascular:     Rate and Rhythm: Normal rate and regular rhythm.     Heart sounds: No murmur heard. Pulmonary:     Effort: Pulmonary effort is normal. No respiratory distress.     Breath sounds: Normal breath sounds.  Abdominal:     Palpations: Abdomen is soft.     Tenderness: There is no abdominal tenderness.  Musculoskeletal:        General: Swelling and tenderness present.     Cervical back: Normal range of motion and neck supple.     Comments: Right knee with increased warmth and swelling no erythema.  Distally dorsalis pedis pulses 1+.  No swelling to the foot or ankle.  An abrasion to the posterior aspect of the right forearm.  No significant bleeding.  Complained of some back discomfort.  But it was more to the right side.  Palpation to that area without any tenderness.  No abrasions no marks.  No midline tenderness.  Skin:    General: Skin is warm and dry.     Capillary Refill: Capillary refill takes less than 2 seconds.  Neurological:     General: No focal deficit present.     Mental Status: He is alert and oriented to person, place, and time.  Psychiatric:        Mood and Affect: Mood normal.     (all labs ordered are listed, but only abnormal results are displayed) Labs Reviewed - No data to display  EKG: None  Radiology: DG Knee Complete 4 Views Right Result Date: 04/08/2024 CLINICAL DATA:  Swelling, fall. EXAM: RIGHT KNEE - COMPLETE 4+ VIEW COMPARISON:  None  Available. FINDINGS: No fracture or dislocation. Minimal osteoarthritis with peripheral spurring. Moderate to large joint effusion. No lipohemarthrosis. No erosive or bony destructive change. Mild generalized soft tissue edema. IMPRESSION: 1. No fracture or dislocation. 2. Moderate to large joint effusion. 3. Mild osteoarthritis. Electronically Signed   By: Andrea Gasman M.D.   On: 04/08/2024 20:35     Procedures   Medications Ordered in the ED  predniSONE  (DELTASONE ) tablet 60 mg (60 mg Oral Given 04/08/24 2231)  HYDROcodone -acetaminophen  (NORCO/VICODIN) 5-325 MG per tablet 1 tablet (1 tablet Oral Given  04/08/24 2231)                                    Medical Decision Making Amount and/or Complexity of Data Reviewed Radiology: ordered.  Risk Prescription drug management.  Clinically consistent with inflammatory arthritis.  Do not feel that this is a joint infection.  Certainly could be gout and most likely is.  X-ray shows no fracture or dislocation moderate to large joint effusion mild osteoarthritis.  Suspect that this is a gout flare.  Patient does have colchicine  at home.  Did take some of that yesterday but said it did not help.  Will treat here with prednisone  and pain medicine.  Patient stable for discharge and follow-up with the [facility].   Final diagnoses:  Acute pain of right knee  Inflammatory arthritis    ED Discharge Orders          Ordered    predniSONE  (DELTASONE ) 10 MG tablet  Daily        04/08/24 2249    HYDROcodone -acetaminophen  (NORCO/VICODIN) 5-325 MG tablet  Every 6 hours PRN        04/08/24 2249               Mercedies Ganesh, MD 04/08/24 2250

## 2024-04-09 DIAGNOSIS — R531 Weakness: Secondary | ICD-10-CM | POA: Diagnosis not present

## 2024-04-09 DIAGNOSIS — Z743 Need for continuous supervision: Secondary | ICD-10-CM | POA: Diagnosis not present

## 2024-07-03 ENCOUNTER — Emergency Department (HOSPITAL_COMMUNITY)

## 2024-07-03 ENCOUNTER — Emergency Department (HOSPITAL_COMMUNITY)
Admission: EM | Admit: 2024-07-03 | Discharge: 2024-07-04 | Disposition: A | Discharge status: Home / Self Care | Attending: Emergency Medicine | Admitting: Emergency Medicine

## 2024-07-03 ENCOUNTER — Other Ambulatory Visit: Payer: Self-pay

## 2024-07-03 DIAGNOSIS — N3001 Acute cystitis with hematuria: Secondary | ICD-10-CM | POA: Insufficient documentation

## 2024-07-03 DIAGNOSIS — R338 Other retention of urine: Secondary | ICD-10-CM | POA: Diagnosis not present

## 2024-07-03 DIAGNOSIS — R350 Frequency of micturition: Secondary | ICD-10-CM | POA: Diagnosis present

## 2024-07-03 DIAGNOSIS — N401 Enlarged prostate with lower urinary tract symptoms: Secondary | ICD-10-CM | POA: Insufficient documentation

## 2024-07-03 LAB — CBC WITH DIFFERENTIAL/PLATELET
Abs Immature Granulocytes: 0.04 K/uL (ref 0.00–0.07)
Basophils Absolute: 0.1 K/uL (ref 0.0–0.1)
Basophils Relative: 0 %
Eosinophils Absolute: 0 K/uL (ref 0.0–0.5)
Eosinophils Relative: 0 %
HCT: 47.3 % (ref 39.0–52.0)
Hemoglobin: 16.4 g/dL (ref 13.0–17.0)
Immature Granulocytes: 0 %
Lymphocytes Relative: 10 %
Lymphs Abs: 1.2 K/uL (ref 0.7–4.0)
MCH: 34.6 pg — ABNORMAL HIGH (ref 26.0–34.0)
MCHC: 34.7 g/dL (ref 30.0–36.0)
MCV: 99.8 fL (ref 80.0–100.0)
Monocytes Absolute: 0.8 K/uL (ref 0.1–1.0)
Monocytes Relative: 6 %
Neutro Abs: 10.6 K/uL — ABNORMAL HIGH (ref 1.7–7.7)
Neutrophils Relative %: 84 %
Platelets: 300 K/uL (ref 150–400)
RBC: 4.74 MIL/uL (ref 4.22–5.81)
RDW: 11.9 % (ref 11.5–15.5)
WBC: 12.7 K/uL — ABNORMAL HIGH (ref 4.0–10.5)
nRBC: 0 % (ref 0.0–0.2)

## 2024-07-03 LAB — URINALYSIS, W/ REFLEX TO CULTURE (INFECTION SUSPECTED)
Bacteria, UA: NONE SEEN
Bilirubin Urine: NEGATIVE
Bilirubin Urine: NEGATIVE
Glucose, UA: >=500 mg/dL — AB
Glucose, UA: NEGATIVE mg/dL
Hgb urine dipstick: NEGATIVE
Ketones, ur: 5 mg/dL — AB
Ketones, ur: NEGATIVE mg/dL
Leukocytes,Ua: NEGATIVE
Nitrite: NEGATIVE
Nitrite: POSITIVE — AB
Protein, ur: NEGATIVE mg/dL
Protein, ur: NEGATIVE mg/dL
Specific Gravity, Urine: 1.009 (ref 1.005–1.030)
WBC, UA: >50 WBC/hpf (ref 0–5)
pH: 6 (ref 5.0–8.0)
pH: 7 (ref 5.0–8.0)

## 2024-07-03 LAB — COMPREHENSIVE METABOLIC PANEL WITH GFR
ALT: 7 U/L (ref 0–44)
AST: 23 U/L (ref 15–41)
Albumin: 4 g/dL (ref 3.5–5.0)
Alkaline Phosphatase: 128 U/L — ABNORMAL HIGH (ref 38–126)
Anion gap: 12 (ref 5–15)
BUN: 11 mg/dL (ref 8–23)
CO2: 26 mmol/L (ref 22–32)
Calcium: 9.4 mg/dL (ref 8.9–10.3)
Chloride: 99 mmol/L (ref 98–111)
Creatinine, Ser: 0.93 mg/dL (ref 0.61–1.24)
GFR, Estimated: >60 mL/min (ref >60)
Glucose, Bld: 137 mg/dL — ABNORMAL HIGH (ref 70–99)
Potassium: 3.7 mmol/L (ref 3.5–5.1)
Sodium: 136 mmol/L (ref 135–145)
Total Bilirubin: 1.3 mg/dL — ABNORMAL HIGH (ref 0.0–1.2)
Total Protein: 8 g/dL (ref 6.5–8.1)

## 2024-07-03 MED ORDER — LIDOCAINE 5 % EX PTCH: 1.0000 | MEDICATED_PATCH | CUTANEOUS | 0 refills | Status: AC

## 2024-07-03 MED ORDER — CEPHALEXIN 500 MG PO CAPS
500.0000 mg | ORAL_CAPSULE | Freq: Once | ORAL | Status: AC
  Administered 2024-07-03: 500 mg via ORAL
  Filled 2024-07-03: qty 1

## 2024-07-03 MED ORDER — CEPHALEXIN 500 MG PO CAPS: 500.0000 mg | ORAL_CAPSULE | Freq: Four times a day (QID) | ORAL | 0 refills | Status: DC

## 2024-07-03 MED ORDER — ACETAMINOPHEN 500 MG PO TABS
1000.0000 mg | ORAL_TABLET | Freq: Once | ORAL | Status: AC
  Administered 2024-07-03: 1000 mg via ORAL
  Filled 2024-07-03: qty 2

## 2024-07-03 NOTE — ED Notes (Signed)
Contacted [facility] for transport 

## 2024-07-03 NOTE — Discharge Instructions (Addendum)
 As discussed, you need to follow-up with your urologist next week.  Seek emergency care if experiencing any new or worsening symptoms.  Alternating between 650 mg Tylenol  and 400 mg Advil : The best way to alternate taking Acetaminophen  (example Tylenol ) and Ibuprofen  (example Advil /Motrin ) is to take them 3 hours apart. For example, if you take ibuprofen  at 6 am you can then take Tylenol  at 9 am. You can continue this regimen throughout the day, making sure you do not exceed the recommended maximum dose for each drug.

## 2024-07-03 NOTE — ED Triage Notes (Signed)
 Pt BIB [facility] from home. Pt reports frequent burning urination. Tuesday pt had a fall hitting ribs. NO head hit, LOC. Pt also c/o right rib pain.   142 palpated 126HR 18RR 96% RA

## 2024-07-03 NOTE — ED Provider Notes (Signed)
 [place] EMERGENCY DEPARTMENT AT [facility] Provider Note   CSN: [id number] Arrival date & time: 07/03/24  2047     Patient presents with: Urinary Frequency   Ross Becker is a 78 y.o. male with PMHx HTN, CAD, BPH who presents to ED concerned for bladder pain. Patient straight caths d/t BPH and follows with outpatient urology for this. Patient stating that he was not able to straight cath appropriately today d/t rib pain. Rib pain started last Tuesday after patient tripped and fell in his bathroom and hit his right ribs on the side of the bathtub. Patient also with chronic right knee pain. Patient stating that d/t the right rib and right knee pain, he was not able to position himself correctly to self-cath. Last successful self-cath was last night. Patient stating that he has not urinated in 24 hours.   As for the fall 3 days ago: patient denies head trauma, LOC, seizure, blood thinners. Denies headache or vision changes. Denies fever, nausea, vomiting, diarrhea.    Urinary Frequency       Prior to Admission medications   Medication Sig Start Date End Date Taking? Authorizing Provider  cephALEXin (KEFLEX) 500 MG capsule Take 1 capsule (500 mg total) by mouth 4 (four) times daily for 7 days. 07/03/24 07/10/24 Yes Fynn Adel F, PA-C  lidocaine  (LIDODERM ) 5 % Place 1 patch onto the skin daily. Remove & Discard patch within 12 hours or as directed by MD 07/03/24  Yes Hoy Nidia FALCON, PA-C  albuterol  (VENTOLIN  HFA) 108 (90 Base) MCG/ACT inhaler Inhale 1-2 puffs into the lungs every 6 (six) hours as needed for wheezing or shortness of breath. 07/22/19   Long, Joshua G, MD  cholecalciferol (VITAMIN D) 1000 UNITS tablet Take 2,000 Units by mouth daily.     [provider]  colchicine  0.6 MG tablet Take 1 tablet (0.6 mg total) by mouth 2 (two) times daily. 04/18/23   Nanavati, Ankit, MD  cyanocobalamin 1000 MCG tablet Take 1,000 mcg by mouth daily.      [provider]  enalapril  (VASOTEC ) 20 MG tablet Take 10 mg by mouth daily.    [provider]  HYDROcodone -acetaminophen  (NORCO/VICODIN) 5-325 MG tablet Take 1 tablet by mouth every 6 (six) hours as needed for moderate pain (pain score 4-6). 04/08/24   Zackowski, Scott, MD  levothyroxine  (SYNTHROID , LEVOTHROID) 50 MCG tablet Take 50 mcg by mouth daily before breakfast.    [provider]  loperamide (IMODIUM) 2 MG capsule Take 4 mg by mouth as needed for diarrhea or loose stools.    [provider]  omeprazole (PRILOSEC) 20 MG capsule Take 40 mg by mouth daily.     [provider]  ondansetron  (ZOFRAN  ODT) 4 MG disintegrating tablet Take 1 tablet (4 mg total) by mouth every 8 (eight) hours as needed for up to 15 doses for nausea or vomiting. 05/01/21   Cottie Donnice PARAS, MD  oxyCODONE -acetaminophen  (PERCOCET/ROXICET) 5-325 MG tablet Take 1 tablet by mouth every 6 (six) hours as needed for severe pain. 10/20/22   Zelaya, Oscar A, PA-C  predniSONE  (DELTASONE ) 10 MG tablet Take 5 tablets (50 mg total) by mouth daily. 04/18/23   Charlyn Sora, MD  predniSONE  (DELTASONE ) 10 MG tablet Take 4 tablets (40 mg total) by mouth daily. 04/08/24   Zackowski, Scott, MD  terazosin  (HYTRIN ) 2 MG capsule Take 8 mg by mouth at bedtime.    [provider]    Allergies: Mushroom extract complex (  obsolete) and Penicillins    Review of Systems  Genitourinary:  Positive for difficulty urinating.    Updated Vital Signs BP (!) 148/96   Pulse (!) 116   Temp 98.7 F (37.1 C) (Oral)   Resp 19   SpO2 96%   Physical Exam Vitals and nursing note reviewed.  Constitutional:      General: He is not in acute distress.    Appearance: He is not ill-appearing or toxic-appearing.  HENT:     Head: Normocephalic and atraumatic.     Mouth/Throat:     Mouth: Mucous membranes are moist.  Eyes:     General: No scleral icterus.       Right eye: No discharge.        Left  eye: No discharge.     Conjunctiva/sclera: Conjunctivae normal.  Cardiovascular:     Rate and Rhythm: Normal rate and regular rhythm.     Pulses: Normal pulses.     Heart sounds: Normal heart sounds. No murmur heard. Pulmonary:     Effort: Pulmonary effort is normal. No respiratory distress.     Breath sounds: Normal breath sounds. No wheezing, rhonchi or rales.  Abdominal:     General: Abdomen is flat. Bowel sounds are normal. There is no distension.     Palpations: Abdomen is soft. There is no mass.     Tenderness: There is abdominal tenderness.     Comments: Suprapubic tenderness  Musculoskeletal:     Right lower leg: No edema.     Left lower leg: No edema.  Skin:    General: Skin is warm and dry.     Findings: No rash.  Neurological:     General: No focal deficit present.     Mental Status: He is alert and oriented to person, place, and time. Mental status is at baseline.  Psychiatric:        Mood and Affect: Mood normal.        Behavior: Behavior normal.     (all labs ordered are listed, but only abnormal results are displayed) Labs Reviewed  COMPREHENSIVE METABOLIC PANEL WITH GFR - Abnormal; Notable for the following components:      Result Value   Glucose, Bld 137 (*)    Alkaline Phosphatase 128 (*)    Total Bilirubin 1.3 (*)    All other components within normal limits  CBC WITH DIFFERENTIAL/PLATELET - Abnormal; Notable for the following components:   WBC 12.7 (*)    MCH 34.6 (*)    Neutro Abs 10.6 (*)    All other components within normal limits  URINALYSIS, W/ REFLEX TO CULTURE (INFECTION SUSPECTED) - Abnormal; Notable for the following components:   Color, Urine STRAW (*)    Specific Gravity, Urine >1.046 (*)    Glucose, UA >=500 (*)    Ketones, ur 5 (*)    All other components within normal limits  URINALYSIS, W/ REFLEX TO CULTURE (INFECTION SUSPECTED) - Abnormal; Notable for the following components:   APPearance HAZY (*)    Hgb urine dipstick MODERATE  (*)    Nitrite POSITIVE (*)    Leukocytes,Ua LARGE (*)    Bacteria, UA FEW (*)    All other components within normal limits  URINE CULTURE    EKG: None  Radiology: DG Ribs Unilateral W/Chest Right Result Date: 07/03/2024 CLINICAL DATA:  Fall EXAM: RIGHT RIBS AND CHEST - 3+ VIEW COMPARISON:  08/05/2019 FINDINGS: Single view chest demonstrates no focal opacity or pleural effusion. Normal  cardiac size. No pneumothorax. Right rib series demonstrates no definitive displaced right rib fracture., question erosion right eleventh and twelfth ribs. IMPRESSION: 1. No active cardiopulmonary disease. 2. No definitive displaced right rib fracture. Question erosion of the right eleventh and twelfth ribs. This may be correlated with chest CT Electronically Signed   By: Luke Bun M.D.   On: 07/03/2024 22:26     Procedures   Medications Ordered in the ED  acetaminophen  (TYLENOL ) tablet 1,000 mg (1,000 mg Oral Given 07/03/24 2318)  cephALEXin (KEFLEX) capsule 500 mg (500 mg Oral Given 07/03/24 2318)    Clinical Course as of 07/03/24 2346  Fri Jul 03, 2024  2345 11:40PM: patient's HR has decreased to 90's after foley placed [SM]    Clinical Course User Index [SM] Hoy Nidia FALCON, PA-C                                 Medical Decision Making Amount and/or Complexity of Data Reviewed Labs: ordered. Radiology: ordered.   This patient presents to the ED for concern of urinary retention, this involves an extensive number of treatment options, and is a complaint that carries with it a high risk of complications and morbidity.  The differential diagnosis includes outflow obstruction, infection, medication adverse reaction, neurologic impairment   Co morbidities that complicate the patient evaluation  HTN, CAD, BPH   Additional history obtained:  PCP with [facility]   Problem List / ED Course / Critical interventions / Medication management  Patient presents to ED  concerned for suprapubic pain and urinary retention x24 hours. Patient usually self-caths d/t BPH but could not do this today because of pain in his right rib and right knee. Physical exam with suprapubic tenderness to palpation. RN was able to insert foley catheter with drained. Foley left in place and patient is feeling better. Of note, patient with mechanical fall 3 days ago and is having residual right lower rib pain since. Physical exam with tenderness of this area. Chest xray showing erosion of lower ribs without concern for fracture. Shared with patient that he will need to follow up with PCP for this. Patient is otherwise handling the pain well and is not hypoxic. Patient agreeable with this plan for his rib pain. I Ordered, and personally interpreted labs.  CBC with microcytosis at 12.7.  No anemia.  CMP with mildly elevated ALP at 128 and mildly elevated T. bili 1.3.  kidney function test. UA concerning for infection with positive nitrite, large leukocytes, >50 WBC, few bacteria. Of note, the patient's UA was initially documented for this patient.  The second UA collected is actually belonging to the correct patient and obtained from new Foley catheter. Shared all results with patient. Answered all questions. Patient educated on keeping currently foley until cleared by his urologist. Will also prescribe patient keflex for UTI and urine culture is sent. Patient agreeable to plan.  Staffed with Dr. Cottie who agrees with plan. I have reviewed the patients home medicines and have made adjustments as needed The patient has been appropriately medically screened and/or stabilized in the ED. I have low suspicion for any other emergent medical condition which would require further screening, evaluation or treatment in the ED or require inpatient management. At time of discharge the patient is hemodynamically stable and in no acute distress. I have discussed work-up results and diagnosis with patient  and answered all questions. Patient  is agreeable with discharge plan. We discussed strict return precautions for returning to the emergency department and they verbalized understanding.     Social Determinants of Health:  geriatric      Final diagnoses:  Acute cystitis with hematuria  Urinary retention due to benign prostatic hyperplasia    ED Discharge Orders          Ordered    cephALEXin (KEFLEX) 500 MG capsule  4 times daily        07/03/24 2310    lidocaine  (LIDODERM ) 5 %  Every 24 hours        07/03/24 2319               Hoy Nidia FALCON, [place] 07/03/24 2323    Cottie Donnice PARAS, MD 07/03/24 2328

## 2024-07-04 LAB — URINALYSIS, W/ REFLEX TO CULTURE (INFECTION SUSPECTED): Bacteria, UA: NONE SEEN

## 2024-07-06 LAB — URINE CULTURE: Culture: >=100000 — AB

## 2024-07-07 ENCOUNTER — Telehealth (HOSPITAL_BASED_OUTPATIENT_CLINIC_OR_DEPARTMENT_OTHER): Payer: Self-pay

## 2024-07-07 NOTE — Progress Notes (Signed)
 Pharmacy: Antimicrobial Stewardship Note  17 YOM with hx BPH and self-caths at home who had a fall earlier in the week with rib and knee pain making it more difficult to position the cath due to pain.   Foley was placed and the patient was told to follow-up with urology.  Urine cultures collected show MSSA which could be a causative pathogen of his UTI due to hx self-cath or could be reflective of a more disseminated infection. No fevers, mild leukocytosis noted, 12.7. The patient was prescribed cephalexin which will cover.   Would recommend the patient complete the course of cephalexin as prescribed and consider repeating urine cultures with urology or PCP ~1 week after treatment completion to ensure clear.   Discussed with ED provider: Merl Overland, PA-C  Thank you for allowing pharmacy to be a part of this patient's care.  Almarie Lunger, PharmD, BCPS, BCIDP Infectious Diseases Clinical Pharmacist 07/07/2024 8:25 AM   **Pharmacist phone directory can now be found on [email or web address] ([email or web address]).  Listed under [facility].

## 2024-07-08 ENCOUNTER — Emergency Department (HOSPITAL_COMMUNITY)

## 2024-07-08 ENCOUNTER — Encounter (HOSPITAL_COMMUNITY): Payer: Self-pay

## 2024-07-08 ENCOUNTER — Other Ambulatory Visit: Payer: Self-pay

## 2024-07-08 ENCOUNTER — Inpatient Hospital Stay (HOSPITAL_COMMUNITY)
Admission: EM | Admit: 2024-07-08 | Discharge: 2024-07-16 | DRG: 699 | Disposition: A | Discharge status: Skilled Nursing Facility | Attending: Student | Admitting: Student

## 2024-07-08 DIAGNOSIS — T83518A Infection and inflammatory reaction due to other urinary catheter, initial encounter: Principal | ICD-10-CM | POA: Diagnosis present

## 2024-07-08 DIAGNOSIS — I10 Essential (primary) hypertension: Secondary | ICD-10-CM | POA: Diagnosis present

## 2024-07-08 DIAGNOSIS — N400 Enlarged prostate without lower urinary tract symptoms: Secondary | ICD-10-CM | POA: Insufficient documentation

## 2024-07-08 DIAGNOSIS — M109 Gout, unspecified: Secondary | ICD-10-CM | POA: Diagnosis present

## 2024-07-08 DIAGNOSIS — R338 Other retention of urine: Secondary | ICD-10-CM | POA: Diagnosis present

## 2024-07-08 DIAGNOSIS — S72114A Nondisplaced fracture of greater trochanter of right femur, initial encounter for closed fracture: Secondary | ICD-10-CM

## 2024-07-08 DIAGNOSIS — E876 Hypokalemia: Secondary | ICD-10-CM | POA: Diagnosis not present

## 2024-07-08 DIAGNOSIS — Y846 Urinary catheterization as the cause of abnormal reaction of the patient, or of later complication, without mention of misadventure at the time of the procedure: Secondary | ICD-10-CM | POA: Diagnosis present

## 2024-07-08 DIAGNOSIS — Z808 Family history of malignant neoplasm of other organs or systems: Secondary | ICD-10-CM

## 2024-07-08 DIAGNOSIS — K5900 Constipation, unspecified: Secondary | ICD-10-CM | POA: Diagnosis present

## 2024-07-08 DIAGNOSIS — M25561 Pain in right knee: Principal | ICD-10-CM | POA: Diagnosis present

## 2024-07-08 DIAGNOSIS — I2510 Atherosclerotic heart disease of native coronary artery without angina pectoris: Secondary | ICD-10-CM | POA: Diagnosis present

## 2024-07-08 DIAGNOSIS — G8929 Other chronic pain: Secondary | ICD-10-CM | POA: Diagnosis present

## 2024-07-08 DIAGNOSIS — R531 Weakness: Secondary | ICD-10-CM | POA: Diagnosis present

## 2024-07-08 DIAGNOSIS — Z79899 Other long term (current) drug therapy: Secondary | ICD-10-CM

## 2024-07-08 DIAGNOSIS — E039 Hypothyroidism, unspecified: Secondary | ICD-10-CM | POA: Diagnosis present

## 2024-07-08 DIAGNOSIS — Z8619 Personal history of other infectious and parasitic diseases: Secondary | ICD-10-CM

## 2024-07-08 DIAGNOSIS — N401 Enlarged prostate with lower urinary tract symptoms: Secondary | ICD-10-CM | POA: Diagnosis present

## 2024-07-08 DIAGNOSIS — M159 Polyosteoarthritis, unspecified: Secondary | ICD-10-CM | POA: Diagnosis present

## 2024-07-08 DIAGNOSIS — Z79890 Hormone replacement therapy (postmenopausal): Secondary | ICD-10-CM

## 2024-07-08 DIAGNOSIS — M25461 Effusion, right knee: Secondary | ICD-10-CM | POA: Diagnosis present

## 2024-07-08 DIAGNOSIS — K219 Gastro-esophageal reflux disease without esophagitis: Secondary | ICD-10-CM | POA: Diagnosis present

## 2024-07-08 DIAGNOSIS — R509 Fever, unspecified: Secondary | ICD-10-CM

## 2024-07-08 DIAGNOSIS — I959 Hypotension, unspecified: Secondary | ICD-10-CM | POA: Diagnosis present

## 2024-07-08 DIAGNOSIS — N390 Urinary tract infection, site not specified: Secondary | ICD-10-CM | POA: Diagnosis present

## 2024-07-08 LAB — COMPREHENSIVE METABOLIC PANEL WITH GFR
ALT: 12 U/L (ref 0–44)
AST: 27 U/L (ref 15–41)
Albumin: 3.7 g/dL (ref 3.5–5.0)
Alkaline Phosphatase: 110 U/L (ref 38–126)
Anion gap: 12 (ref 5–15)
BUN: 14 mg/dL (ref 8–23)
CO2: 27 mmol/L (ref 22–32)
Calcium: 9.7 mg/dL (ref 8.9–10.3)
Chloride: 96 mmol/L — ABNORMAL LOW (ref 98–111)
Creatinine, Ser: 0.94 mg/dL (ref 0.61–1.24)
GFR, Estimated: >60 mL/min (ref >60)
Glucose, Bld: 105 mg/dL — ABNORMAL HIGH (ref 70–99)
Potassium: 3.5 mmol/L (ref 3.5–5.1)
Sodium: 135 mmol/L (ref 135–145)
Total Bilirubin: 1 mg/dL (ref 0.0–1.2)
Total Protein: 7.6 g/dL (ref 6.5–8.1)

## 2024-07-08 LAB — CBC WITH DIFFERENTIAL/PLATELET
Abs Immature Granulocytes: 0.05 K/uL (ref 0.00–0.07)
Basophils Absolute: 0 K/uL (ref 0.0–0.1)
Basophils Relative: 0 %
Eosinophils Absolute: 0 K/uL (ref 0.0–0.5)
Eosinophils Relative: 0 %
HCT: 43.2 % (ref 39.0–52.0)
Hemoglobin: 14.8 g/dL (ref 13.0–17.0)
Immature Granulocytes: 1 %
Lymphocytes Relative: 10 %
Lymphs Abs: 1.1 K/uL (ref 0.7–4.0)
MCH: 33.9 pg (ref 26.0–34.0)
MCHC: 34.3 g/dL (ref 30.0–36.0)
MCV: 99.1 fL (ref 80.0–100.0)
Monocytes Absolute: 1.2 K/uL — ABNORMAL HIGH (ref 0.1–1.0)
Monocytes Relative: 11 %
Neutro Abs: 8.6 K/uL — ABNORMAL HIGH (ref 1.7–7.7)
Neutrophils Relative %: 78 %
Platelets: 263 K/uL (ref 150–400)
RBC: 4.36 MIL/uL (ref 4.22–5.81)
RDW: 11.5 % (ref 11.5–15.5)
WBC: 10.9 K/uL — ABNORMAL HIGH (ref 4.0–10.5)
nRBC: 0 % (ref 0.0–0.2)

## 2024-07-08 LAB — I-STAT CG4 LACTIC ACID, ED: Lactic Acid, Venous: 1 mmol/L (ref 0.5–1.9)

## 2024-07-08 LAB — URINALYSIS, ROUTINE W REFLEX MICROSCOPIC
Bilirubin Urine: NEGATIVE
Glucose, UA: NEGATIVE mg/dL
Ketones, ur: NEGATIVE mg/dL
Nitrite: POSITIVE — AB
Protein, ur: NEGATIVE mg/dL
Specific Gravity, Urine: 1.01 (ref 1.005–1.030)
WBC, UA: >50 WBC/hpf (ref 0–5)
pH: 5 (ref 5.0–8.0)

## 2024-07-08 MED ORDER — SODIUM CHLORIDE 0.9 % IV SOLN
2.0000 g | Freq: Once | INTRAVENOUS | Status: AC
  Administered 2024-07-08: 2 g via INTRAVENOUS
  Filled 2024-07-08: qty 20

## 2024-07-08 NOTE — Telephone Encounter (Signed)
 Post ED Visit - Positive Culture Follow-up: Unsuccessful Patient Follow-up  Culture assessed and recommendations reviewed by:  []  Rankin Dee, Pharm.D. []  Venetia Gully, Pharm.D., BCPS AQ-ID []  Garrel Crews, Pharm.D., BCPS [x]  Almarie Lunger, Pharm.D., BCPS []  [place], [address].D., BCPS, AAHIVP []  Rosaline Bihari, Pharm.D., BCPS, AAHIVP []  Massie Rigg, PharmD []  Jodie Rower, PharmD, BCPS  Positive urine culture  []  Patient discharged without antimicrobial prescription and treatment is now indicated []  Organism is resistant to prescribed ED discharge antimicrobial []  Patient with positive blood cultures  Plan - Continue Cephalexin as prescribed - consider repeat UCX with urology or PCP  1 week after abx completion.  Unable to contact patient after 3 attempts, letter will be sent to address on file  Ruth Camelia Elbe 07/08/2024, 9:34 AM

## 2024-07-08 NOTE — ED Triage Notes (Signed)
 Pt BIBA from home, c/o fever, right knee swelling since Saturday. 99.6 temp from EMS. Took first dose of keflex today due to unable to pick up from pharmacy.   BP 142/76 HR 110 RR 26

## 2024-07-09 ENCOUNTER — Inpatient Hospital Stay (HOSPITAL_COMMUNITY)

## 2024-07-09 DIAGNOSIS — N400 Enlarged prostate without lower urinary tract symptoms: Secondary | ICD-10-CM | POA: Insufficient documentation

## 2024-07-09 DIAGNOSIS — N390 Urinary tract infection, site not specified: Secondary | ICD-10-CM | POA: Diagnosis present

## 2024-07-09 DIAGNOSIS — E039 Hypothyroidism, unspecified: Secondary | ICD-10-CM | POA: Insufficient documentation

## 2024-07-09 DIAGNOSIS — I10 Essential (primary) hypertension: Secondary | ICD-10-CM | POA: Diagnosis not present

## 2024-07-09 DIAGNOSIS — T83511A Infection and inflammatory reaction due to indwelling urethral catheter, initial encounter: Secondary | ICD-10-CM | POA: Diagnosis not present

## 2024-07-09 DIAGNOSIS — K219 Gastro-esophageal reflux disease without esophagitis: Secondary | ICD-10-CM | POA: Insufficient documentation

## 2024-07-09 DIAGNOSIS — E876 Hypokalemia: Secondary | ICD-10-CM | POA: Insufficient documentation

## 2024-07-09 DIAGNOSIS — M25561 Pain in right knee: Secondary | ICD-10-CM | POA: Insufficient documentation

## 2024-07-09 LAB — BASIC METABOLIC PANEL WITH GFR
Anion gap: 11 (ref 5–15)
BUN: 11 mg/dL (ref 8–23)
CO2: 27 mmol/L (ref 22–32)
Calcium: 9.4 mg/dL (ref 8.9–10.3)
Chloride: 98 mmol/L (ref 98–111)
Creatinine, Ser: 0.8 mg/dL (ref 0.61–1.24)
GFR, Estimated: >60 mL/min (ref >60)
Glucose, Bld: 115 mg/dL — ABNORMAL HIGH (ref 70–99)
Potassium: 3.3 mmol/L — ABNORMAL LOW (ref 3.5–5.1)
Sodium: 136 mmol/L (ref 135–145)

## 2024-07-09 LAB — CBC
HCT: 41.2 % (ref 39.0–52.0)
Hemoglobin: 14.4 g/dL (ref 13.0–17.0)
MCH: 34.7 pg — ABNORMAL HIGH (ref 26.0–34.0)
MCHC: 35 g/dL (ref 30.0–36.0)
MCV: 99.3 fL (ref 80.0–100.0)
Platelets: 261 K/uL (ref 150–400)
RBC: 4.15 MIL/uL — ABNORMAL LOW (ref 4.22–5.81)
RDW: 11.5 % (ref 11.5–15.5)
WBC: 9.2 K/uL (ref 4.0–10.5)
nRBC: 0 % (ref 0.0–0.2)

## 2024-07-09 LAB — SYNOVIAL CELL COUNT + DIFF, W/ CRYSTALS
Crystals, Fluid: NONE SEEN
Eosinophils-Synovial: 0 % (ref 0–1)
Lymphocytes-Synovial Fld: 0 % (ref 0–20)
Monocyte-Macrophage-Synovial Fluid: 4 % — ABNORMAL LOW (ref 50–90)
Neutrophil, Synovial: 96 % — ABNORMAL HIGH (ref 0–25)
WBC, Synovial: 50100 /mm3 — ABNORMAL HIGH (ref 0–200)

## 2024-07-09 MED ORDER — HYDROCODONE-ACETAMINOPHEN 5-325 MG PO TABS
1.0000 | ORAL_TABLET | Freq: Four times a day (QID) | ORAL | Status: DC | PRN
  Administered 2024-07-09 – 2024-07-13 (×8): 1 via ORAL
  Filled 2024-07-09 (×8): qty 1

## 2024-07-09 MED ORDER — TAMSULOSIN HCL 0.4 MG PO CAPS
0.4000 mg | ORAL_CAPSULE | Freq: Every day | ORAL | Status: DC
  Administered 2024-07-09 – 2024-07-16 (×8): 0.4 mg via ORAL
  Filled 2024-07-09 (×8): qty 1

## 2024-07-09 MED ORDER — IOHEXOL 9 MG/ML PO SOLN
500.0000 mL | ORAL | Status: AC
  Administered 2024-07-09 (×2): 500 mL via ORAL

## 2024-07-09 MED ORDER — ONDANSETRON HCL 4 MG/2ML IJ SOLN: 4.0000 mg | Freq: Four times a day (QID) | INTRAMUSCULAR | Status: DC | PRN

## 2024-07-09 MED ORDER — ENALAPRIL MALEATE 10 MG PO TABS
40.0000 mg | ORAL_TABLET | Freq: Every day | ORAL | Status: DC
  Administered 2024-07-09 – 2024-07-12 (×3): 40 mg via ORAL
  Filled 2024-07-09 (×4): qty 4

## 2024-07-09 MED ORDER — ALBUTEROL SULFATE (2.5 MG/3ML) 0.083% IN NEBU: 3.0000 mL | INHALATION_SOLUTION | Freq: Four times a day (QID) | RESPIRATORY_TRACT | Status: DC | PRN

## 2024-07-09 MED ORDER — TERAZOSIN HCL 5 MG PO CAPS: 8.0000 mg | ORAL_CAPSULE | Freq: Every day | ORAL | Status: DC

## 2024-07-09 MED ORDER — ENOXAPARIN SODIUM 40 MG/0.4ML IJ SOSY
40.0000 mg | PREFILLED_SYRINGE | Freq: Every day | INTRAMUSCULAR | Status: DC
  Administered 2024-07-09 – 2024-07-15 (×7): 40 mg via SUBCUTANEOUS
  Filled 2024-07-09 (×7): qty 0.4

## 2024-07-09 MED ORDER — POTASSIUM CHLORIDE CRYS ER 20 MEQ PO TBCR
20.0000 meq | EXTENDED_RELEASE_TABLET | Freq: Once | ORAL | Status: AC
  Administered 2024-07-09: 20 meq via ORAL
  Filled 2024-07-09: qty 1

## 2024-07-09 MED ORDER — BUPIVACAINE HCL (PF) 0.5 % IJ SOLN
20.0000 mL | Freq: Once | INTRAMUSCULAR | Status: AC
  Administered 2024-07-09: 20 mL
  Filled 2024-07-09: qty 30

## 2024-07-09 MED ORDER — CHLORHEXIDINE GLUCONATE CLOTH 2 % EX PADS
6.0000 | MEDICATED_PAD | Freq: Every day | CUTANEOUS | Status: DC
  Administered 2024-07-09 – 2024-07-16 (×8): 6 via TOPICAL

## 2024-07-09 MED ORDER — SODIUM CHLORIDE 0.9 % IV SOLN
2.0000 g | INTRAVENOUS | Status: DC
  Administered 2024-07-09 – 2024-07-14 (×6): 2 g via INTRAVENOUS
  Filled 2024-07-09 (×6): qty 20

## 2024-07-09 MED ORDER — GABAPENTIN 100 MG PO CAPS
200.0000 mg | ORAL_CAPSULE | Freq: Every day | ORAL | Status: DC
  Administered 2024-07-09 – 2024-07-15 (×7): 200 mg via ORAL
  Filled 2024-07-09 (×7): qty 2

## 2024-07-09 MED ORDER — ACETAMINOPHEN 650 MG RE SUPP: 650.0000 mg | Freq: Four times a day (QID) | RECTAL | Status: DC | PRN

## 2024-07-09 MED ORDER — VANCOMYCIN HCL 1500 MG/300ML IV SOLN
1500.0000 mg | Freq: Once | INTRAVENOUS | Status: AC
  Administered 2024-07-09: 1500 mg via INTRAVENOUS
  Filled 2024-07-09: qty 300

## 2024-07-09 MED ORDER — LEVOTHYROXINE SODIUM 50 MCG PO TABS
50.0000 ug | ORAL_TABLET | Freq: Every day | ORAL | Status: DC
  Administered 2024-07-10 – 2024-07-16 (×7): 50 ug via ORAL
  Filled 2024-07-09 (×7): qty 1

## 2024-07-09 MED ORDER — ACETAMINOPHEN 325 MG PO TABS
650.0000 mg | ORAL_TABLET | Freq: Four times a day (QID) | ORAL | Status: DC | PRN
Start: 2024-07-09 — End: 2024-07-13
  Administered 2024-07-09: 650 mg via ORAL
  Filled 2024-07-09: qty 2

## 2024-07-09 MED ORDER — MELATONIN 5 MG PO TABS
5.0000 mg | ORAL_TABLET | Freq: Every evening | ORAL | Status: DC | PRN
Start: 2024-07-09 — End: 2024-07-16
  Filled 2024-07-09: qty 1

## 2024-07-09 MED ORDER — ONDANSETRON HCL 4 MG PO TABS: 4.0000 mg | ORAL_TABLET | Freq: Four times a day (QID) | ORAL | Status: DC | PRN

## 2024-07-09 MED ORDER — SODIUM CHLORIDE 0.9 % IV BOLUS
1000.0000 mL | Freq: Once | INTRAVENOUS | Status: AC
  Administered 2024-07-09: 1000 mL via INTRAVENOUS

## 2024-07-09 MED ORDER — VANCOMYCIN HCL IN DEXTROSE 1-5 GM/200ML-% IV SOLN
1000.0000 mg | Freq: Two times a day (BID) | INTRAVENOUS | Status: DC
  Administered 2024-07-10 – 2024-07-11 (×3): 1000 mg via INTRAVENOUS
  Filled 2024-07-09 (×5): qty 200

## 2024-07-09 MED ORDER — IOHEXOL 9 MG/ML PO SOLN
ORAL | Status: AC
  Filled 2024-07-09: qty 1000

## 2024-07-09 MED ORDER — POLYETHYLENE GLYCOL 3350 17 G PO PACK
17.0000 g | PACK | Freq: Every day | ORAL | Status: DC | PRN
  Administered 2024-07-11: 17 g via ORAL
  Filled 2024-07-09 (×2): qty 1

## 2024-07-09 MED ORDER — PANTOPRAZOLE SODIUM 40 MG PO TBEC
40.0000 mg | DELAYED_RELEASE_TABLET | Freq: Every day | ORAL | Status: DC
  Administered 2024-07-09 – 2024-07-16 (×8): 40 mg via ORAL
  Filled 2024-07-09 (×8): qty 1

## 2024-07-09 MED ORDER — FLUTICASONE PROPIONATE 50 MCG/ACT NA SUSP
1.0000 | NASAL | Status: DC
  Administered 2024-07-09 – 2024-07-15 (×2): 1 via NASAL
  Filled 2024-07-09: qty 16

## 2024-07-09 NOTE — Progress Notes (Signed)
 [facility]plains received a consult to assist with advance directives.  He shared that he has been thinking about this for some time.  His sister is his legal next of kin, but he does not want her to be his decision maker as they are not close.  He has some friends, but does not want to put the burden on them.  I explained that we could put his wishes into writing on the form without assigning a HCPOA and he is very interested in that.  I did explain that without a HCPOA assigned, his sister would be notified to make decisions if any needed to be made outside of what is written in the document.  He is interested in looking at paperwork together tomorrow.  I will attempt to see him tomorrow, but please also page as needs arise.

## 2024-07-09 NOTE — H&P (Signed)
 History and Physical    Ross Becker FMW:[id number] DOB: 1945/12/01 DOA: 07/08/2024  DOS: the patient was seen and examined on 07/08/2024  PCP: [facility]   Patient coming from: Home  I have personally briefly reviewed patient's old medical records in [facility] and CareEverywhere  HPI:   Ross Becker is a 78 y.o. year old male with past medical history of hypertension, CAD, intermittent straight cath due to BPH, GERD, hypothyroidism, and arthritis.  He presents to [facility], ED with reports of fever, weakness, and right knee pain and swelling since Saturday.  Of note he was recently discharged from [facility] ED after presenting with dysuria and urinary frequency as well as a fall.  As he was having difficulty with straight cathing an indwelling catheter was placed on ED discharge.  Today he reports he was unable to get his discharge prescription for Keflex until yesterday and he took the first dose yesterday.  He endorses fever, fatigue, weakness, chills, and ongoing chronic knee pain.  ED Course: On arrival to [facility] ED patient was noted to be 36.97, BP 142/80, HR 82, RR to 15, with SpO2 100% on room air.  Plain film of R knee obtained and shows a small suprapatellar knee joint effusion.  Labs notable for UA with positive nitrates, large leukocytes, many bacteria, and >50 WBCs.  Leukocytosis with WBC 10.9, and negative lactic acid 1.0.  Blood cultures collected and in process and (R) knee joint aspiration performed and cultures collected.  He was given Rocephin. TRH contacted for admission.  Review of Systems: As mentioned in the history of present illness. All other systems reviewed and are negative.  Review of Systems  Constitutional:  Positive for chills, fever and malaise/fatigue.  HENT:  Negative for congestion and sore throat.   Respiratory:  Negative for cough and shortness of breath.   Cardiovascular:  Positive for leg swelling. Negative  for chest pain and palpitations.       Bilateral foot swelling  Gastrointestinal:  Negative for abdominal pain, heartburn, nausea and vomiting.  Genitourinary:  Positive for dysuria and hematuria.  Musculoskeletal:  Positive for joint pain and myalgias.  Neurological:  Positive for weakness. Negative for focal weakness.    Past Medical History:  Diagnosis Date   Arthritis    Balance problems    Cervicalgia    Diverticulitis    Enlarged prostate    GERD (gastroesophageal reflux disease)    Gout    Hypertension    Hypothyroidism    Liver lesion     Past Surgical History:  Procedure Laterality Date   CHOLECYSTECTOMY     HERNIA REPAIR     KNEE ARTHROSCOPY WITH MEDIAL MENISECTOMY Left 02/26/2018   Procedure: LEFT KNEE ARTHROSCOPY WITH PARTIAL MEDIAL MENISCECTOMY, SYNOVECTOMY;  Surgeon: Jerri Kay HERO, MD;  Location: [facility];  Service: Orthopedics;  Laterality: Left;     reports that he has never smoked. He has never used smokeless tobacco. He reports current alcohol use. He reports that he does not use drugs.  Allergies  Allergen Reactions   Mushroom Extract Complex (Obsolete)     Avoids due to gout   Penicillins Other (See Comments)    childhood allergy    Family History  Problem Relation Age of Onset   Brain cancer Mother    Cancer Father     Prior to Admission medications   Medication Sig Start Date End Date Taking? Authorizing Provider  allopurinol (  ZYLOPRIM) 300 MG tablet Take 300 mg by mouth daily. 04/29/24  Yes [provider]  cephALEXin (KEFLEX) 500 MG capsule Take 1 capsule (500 mg total) by mouth 4 (four) times daily for 7 days. 07/03/24 07/10/24 Yes Meredith, Nidia F, PA-C  cholecalciferol (VITAMIN D) 1000 UNITS tablet Take 2,000 Units by mouth daily.    Yes [provider]  colchicine  0.6 MG tablet TAKE TWO TABLETS BY MOUTH ONCE AT ONSET -  MAY TAKE ONE TABLET ONE HOUR LATER IF NEEDED (MAXIMUM OF 3 TABLETS IN 3 DAYS) 04/29/24   Yes [provider]  cyanocobalamin 1000 MCG tablet Take 1,000 mcg by mouth in the morning and at bedtime.   Yes [provider]  enalapril  (VASOTEC ) 20 MG tablet Take 40 mg by mouth daily.   Yes [provider]  fluticasone (FLONASE) 50 MCG/ACT nasal spray Place 1 spray into both nostrils every other day.   Yes [provider]  gabapentin (NEURONTIN) 100 MG capsule Take 200 mg by mouth at bedtime. 08/20/23  Yes [provider]  HYDROcodone -acetaminophen  (NORCO/VICODIN) 5-325 MG tablet Take 1 tablet by mouth every 6 (six) hours as needed for moderate pain (pain score 4-6). 04/08/24  Yes Zackowski, Scott, MD  levothyroxine  (SYNTHROID , LEVOTHROID) 50 MCG tablet Take 50 mcg by mouth daily before breakfast.   Yes [provider]  omeprazole (PRILOSEC) 20 MG capsule Take 40 mg by mouth daily.    Yes [provider]  ondansetron  (ZOFRAN  ODT) 4 MG disintegrating tablet Take 1 tablet (4 mg total) by mouth every 8 (eight) hours as needed for up to 15 doses for nausea or vomiting. 05/01/21  Yes Trifan, Donnice PARAS, MD  pantoprazole  (PROTONIX ) 40 MG tablet Take 40 mg by mouth daily. 01/30/24  Yes [provider]  simethicone (MYLICON) 80 MG chewable tablet Chew 80 mg by mouth as needed for flatulence. 07/30/13  Yes [provider]  tamsulosin (FLOMAX) 0.4 MG CAPS capsule Take 0.4 mg by mouth daily.   Yes [provider]  albuterol  (VENTOLIN  HFA) 108 (90 Base) MCG/ACT inhaler Inhale 1-2 puffs into the lungs every 6 (six) hours as needed for wheezing or shortness of breath. Patient not taking: Reported on 07/09/2024 07/22/19   Long, Joshua G, MD  indomethacin (INDOCIN) 50 MG capsule Take 50 mg by mouth 3 (three) times daily with meals. 04/29/24   [provider]  lidocaine  (LIDODERM ) 5 % Place 1 patch onto the skin daily. Remove & Discard patch within 12 hours or as directed by MD 07/03/24   Hoy Nidia FALCON, PA-C   loperamide (IMODIUM) 2 MG capsule Take 4 mg by mouth as needed for diarrhea or loose stools. Patient not taking: Reported on 07/09/2024    [provider]  oxyCODONE -acetaminophen  (PERCOCET/ROXICET) 5-325 MG tablet Take 1 tablet by mouth every 6 (six) hours as needed for severe pain. Patient not taking: Reported on 07/09/2024 10/20/22   Zelaya, Oscar A, PA-C  predniSONE  (DELTASONE ) 10 MG tablet Take 5 tablets (50 mg total) by mouth daily. Patient not taking: Reported on 07/09/2024 04/18/23   Charlyn Sora, MD  predniSONE  (DELTASONE ) 10 MG tablet Take 4 tablets (40 mg total) by mouth daily. Patient not taking: Reported on 07/09/2024 04/08/24   Zackowski, Scott, MD  terazosin  (HYTRIN ) 2 MG capsule Take 8 mg by mouth at bedtime.    [provider]    Physical Exam: Vitals:   07/09/24 0600 07/09/24 0753 07/09/24 1152 07/09/24 1259  BP: 131/75 ROLLEN)  145/81 137/69 127/74  Pulse: 93 (!) 102 (!) 101 (!) 105  Resp: 16 16 16 16   Temp:  98.8 F (37.1 C) 98.8 F (37.1 C) 99.5 F (37.5 C)  TempSrc:  Oral Oral Oral  SpO2: 95% 97% 94% 99%  Weight:      Height:        Physical Exam Vitals and nursing note reviewed.  HENT:     Head: Normocephalic.  Cardiovascular:     Rate and Rhythm: Normal rate and regular rhythm.  Pulmonary:     Effort: Pulmonary effort is normal.  Abdominal:     Palpations: Abdomen is soft.     Tenderness: There is no abdominal tenderness.  Musculoskeletal:     Right lower leg: Edema present.     Left lower leg: Edema present.     Comments: (R) knee swelling and calor; Bilateral foot swelling and calor  Skin:    General: Skin is warm and dry.     Capillary Refill: Capillary refill takes less than 2 seconds.  Neurological:     General: No focal deficit present.     Mental Status: He is alert and oriented to person, place, and time.      Labs on Admission: I have personally reviewed following labs and imaging studies  CBC: Recent Labs  Lab  07/03/24 2213 07/08/24 2131 07/09/24 0811  WBC 12.7* 10.9* 9.2  NEUTROABS 10.6* 8.6*  --   HGB 16.4 14.8 14.4  HCT 47.3 43.2 41.2  MCV 99.8 99.1 99.3  PLT 300 263 261   Basic Metabolic Panel: Recent Labs  Lab 07/03/24 2213 07/08/24 2131 07/09/24 0811  NA 136 135 136  K 3.7 3.5 3.3*  CL 99 96* 98  CO2 26 27 27   GLUCOSE 137* 105* 115*  BUN 11 14 11   CREATININE 0.93 0.94 0.80  CALCIUM  9.4 9.7 9.4   GFR: Estimated Creatinine Clearance: 81.4 mL/min (by C-G formula based on SCr of 0.8 mg/dL). Liver Function Tests: Recent Labs  Lab 07/03/24 2213 07/08/24 2131  AST 23 27  ALT 7 12  ALKPHOS 128* 110  BILITOT 1.3* 1.0  PROT 8.0 7.6  ALBUMIN 4.0 3.7   No results for input(s): LIPASE, AMYLASE in the last 168 hours. No results for input(s): AMMONIA in the last 168 hours. Coagulation Profile: No results for input(s): INR, PROTIME in the last 168 hours. Cardiac Enzymes: No results for input(s): CKTOTAL, CKMB, CKMBINDEX, TROPONINI, TROPONINIHS in the last 168 hours. BNP (last 3 results) No results for input(s): BNP in the last 8760 hours. HbA1C: No results for input(s): HGBA1C in the last 72 hours. CBG: No results for input(s): GLUCAP in the last 168 hours. Lipid Profile: No results for input(s): CHOL, HDL, LDLCALC, TRIG, CHOLHDL, LDLDIRECT in the last 72 hours. Thyroid  Function Tests: No results for input(s): TSH, T4TOTAL, FREET4, T3FREE, THYROIDAB in the last 72 hours. Anemia Panel: No results for input(s): VITAMINB12, FOLATE, FERRITIN, TIBC, IRON, RETICCTPCT in the last 72 hours. Urine analysis:    Component Value Date/Time   COLORURINE YELLOW 07/08/2024 2135   APPEARANCEUR CLOUDY (A) 07/08/2024 2135   LABSPEC 1.010 07/08/2024 2135   PHURINE 5.0 07/08/2024 2135   GLUCOSEU NEGATIVE 07/08/2024 2135   HGBUR MODERATE (A) 07/08/2024 2135   BILIRUBINUR NEGATIVE 07/08/2024 2135   KETONESUR NEGATIVE 07/08/2024  2135   PROTEINUR NEGATIVE 07/08/2024 2135   UROBILINOGEN 0.2 10/07/2014 0007   NITRITE POSITIVE (A) 07/08/2024 2135   LEUKOCYTESUR LARGE (A) 07/08/2024 2135  Radiological Exams on Admission: I have personally reviewed images DG Knee Complete 4 Views Right Result Date: 07/08/2024 EXAM: 4 VIEW(S) XRAY OF THE RIGHT KNEE 07/08/2024 11:33:00 PM COMPARISON: None available. CLINICAL HISTORY: swelling and pain FINDINGS: BONES AND JOINTS: No acute fracture. No focal osseous lesion. No joint dislocation. Small suprapatellar knee joint effusion. Very mild tricompartmental degenerative changes. SOFT TISSUES: The soft tissues are unremarkable. IMPRESSION: 1. No acute findings. 2. Small suprapatellar knee joint effusion. Electronically signed by: Pinkie Pebbles MD 07/08/2024 11:35 PM EST RP Workstation: [id number]     Assessment/Plan Principal Problem:   UTI (urinary tract infection)   #Catheter Associated Urinary Tract Infection - Empirically covered with IV Rocephin in ED, continue - Follow culture data, follow fever curve - Exchange urinary catheter  #(R) Knee Pain - Supportive care - Ortho consulted, see consult note, appreciate their recommendation, and management  #Hypokalemia -Replete PRN  #Hypertension - Continue home Enalapril   #BPH #Urinary Retention - Continue home flomax - Foley exchanged  #Hypothyroidism - Continue home Synthroid   #GERD -Protonix   VTE prophylaxis:  Lovenox   GI prophylaxis: Protonix  Diet: Heart Healthy Access: PIV Lines: Foley Code Status:  Full Code Telemetry: No  Admission status: Observation, Med-Surg Patient is from: Home  Anticipated d/c is to: Home  Anticipated d/c date is: 1-2 days Patient currently: Receiving IV antibiotics, blood, urine, and joint aspiration cultures pending  Family Communication: No family at bedside. Plan of care discussed with patient at bedside.  Questions welcomed and elicited.  Questions answered to patient's  expressed satisfaction.  Patient is cognitively able to update family independently if desired.    Consults called: Dr Genelle with Ortho consulted by EDP   Severity of Illness: The appropriate patient status for this patient is OBSERVATION. Observation status is judged to be reasonable and necessary in order to provide the required intensity of service to ensure the patient's safety. The patient's presenting symptoms, physical exam findings, and initial radiographic and laboratory data in the context of their medical condition is felt to place them at decreased risk for further clinical deterioration. Furthermore, it is anticipated that the patient will be medically stable for discharge from the hospital within 2 midnights of admission.   To reach the provider On-Call:   7AM- 7PM see care teams to locate the attending and reach out to them via [email or web address] Password: [email or web address] 7PM-7AM contact night-coverage If you still have difficulty reaching the appropriate provider, please page the [facility] (Director on Call) for [facility] on amion for assistance  This document was prepared using Conservation officer, historic buildings and may include unintentional dictation errors.  Rockie Rams FNP-BC, PMHNP-BC Nurse Practitioner [facility]

## 2024-07-09 NOTE — Plan of Care (Signed)

## 2024-07-09 NOTE — ED Provider Notes (Signed)
 [place] EMERGENCY DEPARTMENT AT [facility] Provider Note   CSN: [id number] Arrival date & time: 07/08/24  2032     Patient presents with: Knee Pain (Right ) and Fever   Ross Becker is a 78 y.o. male with history of hypertension and arthritis presents to the emergency department today for evaluation of right knee pain and fever.  The patient was seen on 07-03-2024 for acute urinary retention and had a Foley bag placed.  Urine culture was obtained that was positive.  However, unfortunately patient was unable to take the antibiotics.  He reports that he measured a temperature at home of 101 Fahrenheit.  He reports that his right knee has been hurting him for the past 2 weeks has been unchanged.  Atraumatic.  Denies numbness or tingling.  Reports that his lower leg swelling is at its baseline.  No other cough or cold symptoms.  Reports that his catheter has been functioning fine. He is concerned of taking care of himself secondary to his pain and other symptoms.   Knee Pain Associated symptoms: fever   Fever Associated symptoms: no chest pain, no congestion, no cough, no dysuria, no nausea, no rhinorrhea and no vomiting        Prior to Admission medications   Medication Sig Start Date End Date Taking? Authorizing Provider  albuterol  (VENTOLIN  HFA) 108 (90 Base) MCG/ACT inhaler Inhale 1-2 puffs into the lungs every 6 (six) hours as needed for wheezing or shortness of breath. 07/22/19   Long, Joshua G, MD  cephALEXin (KEFLEX) 500 MG capsule Take 1 capsule (500 mg total) by mouth 4 (four) times daily for 7 days. 07/03/24 07/10/24  Hoy Nidia FALCON, PA-C  cholecalciferol (VITAMIN D) 1000 UNITS tablet Take 2,000 Units by mouth daily.     [provider]  colchicine  0.6 MG tablet Take 1 tablet (0.6 mg total) by mouth 2 (two) times daily. 04/18/23   Nanavati, Ankit, MD  cyanocobalamin 1000 MCG tablet Take 1,000 mcg by mouth daily.     [provider]   enalapril  (VASOTEC ) 20 MG tablet Take 10 mg by mouth daily.    [provider]  HYDROcodone -acetaminophen  (NORCO/VICODIN) 5-325 MG tablet Take 1 tablet by mouth every 6 (six) hours as needed for moderate pain (pain score 4-6). 04/08/24   Zackowski, Scott, MD  levothyroxine  (SYNTHROID , LEVOTHROID) 50 MCG tablet Take 50 mcg by mouth daily before breakfast.    [provider]  lidocaine  (LIDODERM ) 5 % Place 1 patch onto the skin daily. Remove & Discard patch within 12 hours or as directed by MD 07/03/24   Hoy Nidia FALCON, PA-C  loperamide (IMODIUM) 2 MG capsule Take 4 mg by mouth as needed for diarrhea or loose stools.    [provider]  omeprazole (PRILOSEC) 20 MG capsule Take 40 mg by mouth daily.     [provider]  ondansetron  (ZOFRAN  ODT) 4 MG disintegrating tablet Take 1 tablet (4 mg total) by mouth every 8 (eight) hours as needed for up to 15 doses for nausea or vomiting. 05/01/21   Cottie Donnice PARAS, MD  oxyCODONE -acetaminophen  (PERCOCET/ROXICET) 5-325 MG tablet Take 1 tablet by mouth every 6 (six) hours as needed for severe pain. 10/20/22   Zelaya, Oscar A, PA-C  predniSONE  (DELTASONE ) 10 MG tablet Take 5 tablets (50 mg total) by mouth daily. 04/18/23   Charlyn Sora, MD  predniSONE  (DELTASONE ) 10 MG tablet Take 4 tablets (40 mg total) by mouth daily. 04/08/24  Zackowski, Scott, MD  terazosin  (HYTRIN ) 2 MG capsule Take 8 mg by mouth at bedtime.    [provider]    Allergies: Mushroom extract complex (obsolete) and Penicillins    Review of Systems  Constitutional:  Positive for fever.  HENT:  Negative for congestion and rhinorrhea.   Respiratory:  Negative for cough and shortness of breath.   Cardiovascular:  Negative for chest pain.  Gastrointestinal:  Negative for abdominal pain, nausea and vomiting.  Genitourinary:  Negative for dysuria and hematuria.  Musculoskeletal:  Positive for arthralgias and joint swelling.    Updated Vital  Signs BP 130/76 (BP Location: Right Arm)   Pulse 98   Temp 99.7 F (37.6 C) (Rectal)   Resp 17   Ht 5' 11 (1.803 m)   Wt 74.4 kg   SpO2 98%   BMI 22.88 kg/m   Physical Exam Vitals and nursing note reviewed.  Constitutional:      General: He is not in acute distress.    Appearance: He is not toxic-appearing.     Comments: Chronically ill-appearing  Eyes:     General: No scleral icterus. Cardiovascular:     Rate and Rhythm: Normal rate.     Pulses:          Dorsalis pedis pulses are 2+ on the right side and 2+ on the left side.       Posterior tibial pulses are 2+ on the right side and 2+ on the left side.     Comments: Edema mainly to the feet with overlying increase in warmth and erythema. Compartments are soft.  Pulmonary:     Effort: Pulmonary effort is normal. No respiratory distress.  Abdominal:     Palpations: Abdomen is soft.     Tenderness: There is no abdominal tenderness.  Musculoskeletal:        General: Tenderness present.     Right lower leg: 1+ Edema present.     Left lower leg: 1+ Edema present.     Comments: Tenderness to the more lateral aspect of the right knee.  Slight increase in warmth.  No overlying erythema.  Knee is held in a slightly flexed position.  Patient unable to fully extend secondary to pain.  Palpable distal pulses.  Compartments are soft.  Patient does have some more doughy edema present more at the feet/ankle area.  He does have some increase in warmth and erythema to the dorsal aspect of bilateral feet.  Skin:    General: Skin is warm and dry.  Neurological:     Mental Status: He is alert.     (all labs ordered are listed, but only abnormal results are displayed) Labs Reviewed  CBC WITH DIFFERENTIAL/PLATELET - Abnormal; Notable for the following components:      Result Value   WBC 10.9 (*)    Neutro Abs 8.6 (*)    Monocytes Absolute 1.2 (*)    All other components within normal limits  COMPREHENSIVE METABOLIC PANEL WITH GFR -  Abnormal; Notable for the following components:   Chloride 96 (*)    Glucose, Bld 105 (*)    All other components within normal limits  URINALYSIS, ROUTINE W REFLEX MICROSCOPIC - Abnormal; Notable for the following components:   APPearance CLOUDY (*)    Hgb urine dipstick MODERATE (*)    Nitrite POSITIVE (*)    Leukocytes,Ua LARGE (*)    Bacteria, UA MANY (*)    All other components within normal limits  CULTURE, BLOOD (  ROUTINE X 2)  CULTURE, BLOOD (ROUTINE X 2)  BODY FLUID CULTURE W GRAM STAIN  SYNOVIAL CELL COUNT + DIFF, W/ CRYSTALS  GLUCOSE, BODY FLUID OTHER            PROTEIN, BODY FLUID (OTHER)  I-STAT CG4 LACTIC ACID, ED    EKG: None  Radiology: DG Knee Complete 4 Views Right Result Date: 07/08/2024 EXAM: 4 VIEW(S) XRAY OF THE RIGHT KNEE 07/08/2024 11:33:00 PM COMPARISON: None available. CLINICAL HISTORY: swelling and pain FINDINGS: BONES AND JOINTS: No acute fracture. No focal osseous lesion. No joint dislocation. Small suprapatellar knee joint effusion. Very mild tricompartmental degenerative changes. SOFT TISSUES: The soft tissues are unremarkable. IMPRESSION: 1. No acute findings. 2. Small suprapatellar knee joint effusion. Electronically signed by: Pinkie Pebbles MD 07/08/2024 11:35 PM EST RP Workstation: [id number]    .Joint Aspiration/Arthrocentesis  Date/Time: 07/09/2024 3:05 AM  Performed by: Bernis Ernst, PA-C Authorized by: Bernis Ernst, PA-C   Consent:    Consent obtained:  Verbal   Consent given by:  Patient   Risks, benefits, and alternatives were discussed: yes     Risks discussed:  Bleeding, incomplete drainage, infection, nerve damage and pain   Alternatives discussed:  No treatment Universal protocol:    Procedure explained and questions answered to patient or proxy's satisfaction: yes     Imaging studies available: yes     Patient identity confirmed:  Verbally with patient Location:    Location:  Knee   Knee:  R knee Anesthesia:     Anesthesia method:  Local infiltration   Local anesthetic:  Bupivacaine  0.5% w/o epi Procedure details:    Preparation: Patient was prepped and draped in usual sterile fashion     Needle gauge:  22 G   Approach:  Lateral   Aspirate amount:  0 Post-procedure details:    Procedure completion:  Procedure terminated electively by provider Comments:     Unsuccessful joint tap. Please see attending procedure note.     Medications Ordered in the ED  cefTRIAXone (ROCEPHIN) 2 g in sodium chloride  0.9 % 100 mL IVPB (0 g Intravenous Stopped 07/09/24 0012)  bupivacaine (PF) (MARCAINE ) 0.5 % injection 20 mL (20 mLs Other Given 07/09/24 0243)    Clinical Course as of 07/09/24 0707  Thu Jul 09, 2024  0418 Spoke with hospitalist [RR]    Clinical Course User Index [RR] Bernis Ernst, PA-C                               Medical Decision Making Amount and/or Complexity of Data Reviewed Labs: ordered. Radiology: ordered.  Risk Prescription drug management. Decision regarding hospitalization.   78 y.o. male presents to the ER for evaluation of fever and right knee pain. Differential diagnosis includes but is not limited to inflammatory arthritis, gout, septic arthritis, UTI, cellulitis. Vital signs slightly elevated temperature at 99.32F rectally, otherwise unremarkable. Physical exam as noted above.   Patient having multiple potential sources for infection given the likely cellulitis on his bilateral feet, UTI I did culture positive unable to take antibiotic, or septic arthritis given the knee pain however he has been having this for 2 weeks.  Discussed with my attending, will do joint aspiration.  Please see procedure note.  I independently reviewed and interpreted the patient's labs.  Urinalysis shows cloudy urine with moderate mount hemoglobin present.  Positive nitrite with large oocytes.  11-20 red blood cells with greater than 50 white blood  cells.  Many bacteria.  White blood cell clumps  present.  Lactic acid within normal limits.  Blood cultures pending.  CBC shows white blood cell count of 10.9 with a left shift.  No anemia.  CMP shows cord 96 with a glucose of 105, no other electrolyte abnormality.  Given the finding of his urine, he was started on Rocephin.  Does have mildly elevated temperature however does not meet criteria for sepsis.  Mildly increased pulse greater than 90 so that is why blood cultures and lactic was added.  Right Knee XR  1. No acute findings. 2. Small suprapatellar knee joint effusion. Per radiologist's interpretation.    Please see procedure note for joint aspiration with my attending.  My attempt is assist as well however my attending was successful.  Joint aspiration revealed white count around 50,000 with pink turbid fluid.  Neutrophil count at 96%.  I consulted orthopedics and spoke with Dr. Genelle.  He thinks this is likely more inflammatory arthritis.  Does not recommend any additional antibiotics.  Recommends steroid/anti-inflammatory treatment, PT OT, and outpatient follow-up.  He will see the patient while inpatient.  Discussed admission with patient that he is amenable to.  Patient admitted to Dr. Franky for daytime admission.   I discussed this case with my attending physician who cosigned this note including patient's presenting symptoms, physical exam, and planned diagnostics and interventions. Attending physician stated agreement with plan or made changes to plan which were implemented.   Attending physician assessed patient at bedside.  Portions of this report may have been transcribed using voice recognition software. Every effort was made to ensure accuracy; however, inadvertent computerized transcription errors may be present.    Final diagnoses:  Acute pain of right knee    ED Discharge Orders     None          Bernis Ernst, [place] 07/09/24 0734    Raford Lenis, MD 07/09/24 [phone]

## 2024-07-09 NOTE — H&P (Signed)
 ORTHOPAEDIC CONSULTATION  REQUESTING PHYSICIAN: Arshad, Mohsin A, MD  Chief Complaint: Right knee pain  HPI: Ross Becker is a 78 y.o. male who presents with with 2 weeks of right knee pain.  Of note he does have a history of gouty arthritis.  He did recently have a Foley catheter placed long-term for acute urinary retention which has subsequently led to a UTI.  He has currently been admitted to the medical service for this.  With regard to the right knee he was treated with steroids in the last visit.  Given his history of gout he was treated symptomatically but his knee pain has persisted.  Past Medical History:  Diagnosis Date   Arthritis    Balance problems    Cervicalgia    Diverticulitis    Enlarged prostate    GERD (gastroesophageal reflux disease)    Gout    Hypertension    Hypothyroidism    Liver lesion    Past Surgical History:  Procedure Laterality Date   CHOLECYSTECTOMY     HERNIA REPAIR     KNEE ARTHROSCOPY WITH MEDIAL MENISECTOMY Left 02/26/2018   Procedure: LEFT KNEE ARTHROSCOPY WITH PARTIAL MEDIAL MENISCECTOMY, SYNOVECTOMY;  Surgeon: Jerri Kay HERO, MD;  Location: [facility];  Service: Orthopedics;  Laterality: Left;   Social History   Socioeconomic History   Marital status: Single    Spouse name: Not on file   Number of children: Not on file   Years of education: Not on file   Highest education level: Not on file  Occupational History   Not on file  Tobacco Use   Smoking status: Never   Smokeless tobacco: Never  Vaping Use   Vaping status: Never Used  Substance and Sexual Activity   Alcohol use: Yes    Comment: occasionally   Drug use: No   Sexual activity: Not on file  Other Topics Concern   Not on file  Social History Narrative   Not on file   Social Drivers of Health   Financial Resource Strain: Not on file  Food Insecurity: Not on file  Transportation Needs: Not on file  Physical Activity: Not on file  Stress: Not  on file  Social Connections: Not on file   Family History  Problem Relation Age of Onset   Brain cancer Mother    Cancer Father    - negative except otherwise stated in the family history section Allergies  Allergen Reactions   Mushroom Extract Complex (Obsolete)     Avoids due to gout   Penicillins Other (See Comments)    childhood allergy   Prior to Admission medications   Medication Sig Start Date End Date Taking? Authorizing Provider  allopurinol (ZYLOPRIM) 300 MG tablet Take 300 mg by mouth daily. 04/29/24  Yes [provider]  cephALEXin (KEFLEX) 500 MG capsule Take 1 capsule (500 mg total) by mouth 4 (four) times daily for 7 days. 07/03/24 07/10/24 Yes Meredith, Nidia F, PA-C  cholecalciferol (VITAMIN D) 1000 UNITS tablet Take 2,000 Units by mouth daily.    Yes [provider]  colchicine  0.6 MG tablet TAKE TWO TABLETS BY MOUTH ONCE AT ONSET -  MAY TAKE ONE TABLET ONE HOUR LATER IF NEEDED (MAXIMUM OF 3 TABLETS IN 3 DAYS) 04/29/24  Yes [provider]  cyanocobalamin 1000 MCG tablet Take 1,000 mcg by mouth in the morning and at bedtime.   Yes [provider]  enalapril  (VASOTEC ) 20 MG tablet Take 40 mg  by mouth daily.   Yes [provider]  fluticasone (FLONASE) 50 MCG/ACT nasal spray Place 1 spray into both nostrils every other day.   Yes [provider]  gabapentin (NEURONTIN) 100 MG capsule Take 200 mg by mouth at bedtime. 08/20/23  Yes [provider]  HYDROcodone -acetaminophen  (NORCO/VICODIN) 5-325 MG tablet Take 1 tablet by mouth every 6 (six) hours as needed for moderate pain (pain score 4-6). 04/08/24  Yes Zackowski, Scott, MD  levothyroxine  (SYNTHROID , LEVOTHROID) 50 MCG tablet Take 50 mcg by mouth daily before breakfast.   Yes [provider]  omeprazole (PRILOSEC) 20 MG capsule Take 40 mg by mouth daily.    Yes [provider]  ondansetron  (ZOFRAN  ODT) 4 MG disintegrating tablet Take 1 tablet  (4 mg total) by mouth every 8 (eight) hours as needed for up to 15 doses for nausea or vomiting. 05/01/21  Yes Trifan, Donnice PARAS, MD  pantoprazole  (PROTONIX ) 40 MG tablet Take 40 mg by mouth daily. 01/30/24  Yes [provider]  simethicone (MYLICON) 80 MG chewable tablet Chew 80 mg by mouth as needed for flatulence. 07/30/13  Yes [provider]  tamsulosin (FLOMAX) 0.4 MG CAPS capsule Take 0.4 mg by mouth daily.   Yes [provider]  albuterol  (VENTOLIN  HFA) 108 (90 Base) MCG/ACT inhaler Inhale 1-2 puffs into the lungs every 6 (six) hours as needed for wheezing or shortness of breath. Patient not taking: Reported on 07/09/2024 07/22/19   Long, Joshua G, MD  indomethacin (INDOCIN) 50 MG capsule Take 50 mg by mouth 3 (three) times daily with meals. 04/29/24   [provider]  lidocaine  (LIDODERM ) 5 % Place 1 patch onto the skin daily. Remove & Discard patch within 12 hours or as directed by MD 07/03/24   Hoy Nidia FALCON, PA-C  loperamide (IMODIUM) 2 MG capsule Take 4 mg by mouth as needed for diarrhea or loose stools. Patient not taking: Reported on 07/09/2024    [provider]  oxyCODONE -acetaminophen  (PERCOCET/ROXICET) 5-325 MG tablet Take 1 tablet by mouth every 6 (six) hours as needed for severe pain. Patient not taking: Reported on 07/09/2024 10/20/22   Zelaya, Oscar A, PA-C  predniSONE  (DELTASONE ) 10 MG tablet Take 5 tablets (50 mg total) by mouth daily. Patient not taking: Reported on 07/09/2024 04/18/23   Charlyn Sora, MD  predniSONE  (DELTASONE ) 10 MG tablet Take 4 tablets (40 mg total) by mouth daily. Patient not taking: Reported on 07/09/2024 04/08/24   Zackowski, Scott, MD  terazosin  (HYTRIN ) 2 MG capsule Take 8 mg by mouth at bedtime.    [provider]   DG Knee Complete 4 Views Right Result Date: 07/08/2024 EXAM: 4 VIEW(S) XRAY OF THE RIGHT KNEE 07/08/2024 11:33:00 PM COMPARISON: None available. CLINICAL HISTORY: swelling and pain  FINDINGS: BONES AND JOINTS: No acute fracture. No focal osseous lesion. No joint dislocation. Small suprapatellar knee joint effusion. Very mild tricompartmental degenerative changes. SOFT TISSUES: The soft tissues are unremarkable. IMPRESSION: 1. No acute findings. 2. Small suprapatellar knee joint effusion. Electronically signed by: Pinkie Pebbles MD 07/08/2024 11:35 PM EST RP Workstation: [id number]     Positive ROS: All other systems have been reviewed and were otherwise negative with the exception of those mentioned in the HPI and as above.  Physical Exam: General: No acute distress Cardiovascular: No pedal edema Respiratory: No cyanosis, no use of accessory musculature GI: No organomegaly, abdomen is soft and non-tender Skin: No lesions in the area of chief complaint Neurologic: Sensation intact  distally Psychiatric: Patient is at baseline mood and affect Lymphatic: No axillary or cervical lymphadenopathy  MUSCULOSKELETAL:  Right knee with about 30 degree arc of range of motion with little effusion.  Difficulty weightbearing.  No redness or diffuse swelling distal neurosensory exams intact  Independent Imaging Review: 3 views x-rays right knee: Normal with minimal effusion  Assessment: 78 year old male with a right knee pain in the setting of a history of gout with a nucleated cell count of 50,000 and an x-ray showing little to no effusion.  I did discuss with him that overall my clinical suspicion for infection is quite low given these numbers and also lack of effusion on x-ray.  I do believe that symptomatic treatment for gout is reasonable at this time and agree with medical admission for treatment of his UTI.  At this time orthopedics will also continue to follow his cultures and plan for intervention should these turn positive which would be concerning for a gouty superinfection although again my suspicion for this is quite low  Plan: Orthopedics to continue to monitor fluid  cultures  Thank you for the consult and the opportunity to see Mr. Bonnetta Elspeth Parker, MD [facility] 8:07 AM

## 2024-07-09 NOTE — Progress Notes (Signed)
 Pharmacy Antibiotic Note  Ross Becker is a 78 y.o. male admitted on 07/08/2024 with reports of fever, weakness, and R knee pain. DG knee showing no acute findings and small suprapatellar knee joint effusion. Ortho suggesting less likely infection compared to gout flare. WBC improved, patient afebrile while on ceftriaxone therapy. Pharmacy has been consulted for vancomycin dosing while awaiting synovial fluid cultures.   Plan: Give vancomycin 1500mg  IV x1, then 1000mg  IV q12h (eAUC 519 using Scr 0.8, TBW) Vanc levels PRN Monitor cultures, renal function, and overall clinical picture De-escalate abx as able  Height: 5' 11 (180.3 cm) Weight: 74.4 kg (164 lb 0.4 oz) IBW/kg (Calculated) : 75.3  Temp (24hrs), Avg:99.1 F (37.3 C), Min:98.4 F (36.9 C), Max:99.8 F (37.7 C)  Recent Labs  Lab 07/03/24 2213 07/08/24 2131 07/08/24 2312 07/09/24 0811  WBC 12.7* 10.9*  --  9.2  CREATININE 0.93 0.94  --  0.80  LATICACIDVEN  --   --  1.0  --     Estimated Creatinine Clearance: 81.4 mL/min (by C-G formula based on SCr of 0.8 mg/dL).    Allergies  Allergen Reactions   Mushroom Extract Complex (Obsolete)     Avoids due to gout   Penicillins Other (See Comments)    childhood allergy    Antimicrobials this admission: 11/12 ceftriaxone >>  11/13 vancomycin >>   Dose adjustments this admission: N/A  Microbiology results: 11/12 BCx: ip 11/13 Bcx: ip 11/13 synovial fl cx: pending   Thank you for allowing pharmacy to be a part of this patient's care.  Lacinda Moats, PharmD Clinical Pharmacist  11/13/20255:56 PM

## 2024-07-10 LAB — BASIC METABOLIC PANEL WITH GFR
Anion gap: 8 (ref 5–15)
BUN: 12 mg/dL (ref 8–23)
CO2: 26 mmol/L (ref 22–32)
Calcium: 8.6 mg/dL — ABNORMAL LOW (ref 8.9–10.3)
Chloride: 100 mmol/L (ref 98–111)
Creatinine, Ser: 0.79 mg/dL (ref 0.61–1.24)
GFR, Estimated: >60 mL/min (ref >60)
Glucose, Bld: 93 mg/dL (ref 70–99)
Potassium: 3.7 mmol/L (ref 3.5–5.1)
Sodium: 134 mmol/L — ABNORMAL LOW (ref 135–145)

## 2024-07-10 LAB — GLUCOSE, BODY FLUID OTHER: Glucose, Body Fluid Other: 61 mg/dL

## 2024-07-10 LAB — PROTEIN, BODY FLUID (OTHER): Total Protein, Body Fluid Other: 3.4 g/dL

## 2024-07-10 MED ORDER — DIPHENHYDRAMINE HCL 50 MG/ML IJ SOLN
25.0000 mg | Freq: Four times a day (QID) | INTRAMUSCULAR | Status: DC | PRN
  Administered 2024-07-10: 25 mg via INTRAVENOUS
  Filled 2024-07-10: qty 1

## 2024-07-10 NOTE — Plan of Care (Signed)

## 2024-07-10 NOTE — TOC Initial Note (Addendum)
 Transition of Care [facility]) - Initial/Assessment Note    Patient Details  Name: Ross Becker MRN: [id number] Date of Birth: [birth year]  Transition of [facility]) CM/SW Contact:    [name], RN Phone Number: 07/10/2024, 10:38 AM  Clinical Narrative:                 Presented for AMS. PTA he is a [place]; lives alone in an apartment;DME-WC,walker; active with [facility] - nursing, oxygen,or SDOH needs;Patient staes he may be able to call a neighbor to assist with transport. Patient is not agreeable going to a SNF because he has a cat (Bibo). Agreeable to allowing CM to reach out to [place]. Inpatient care management will follow. 4:24 PM Patient receives care at; [facility] Jodie Marek, MD [facility] [phone]. [phone]  Expected Discharge Plan: Home w Home Health Services Barriers to Discharge: Continued Medical Work up   Patient Goals and CMS Choice Patient states their goals for this hospitalization and ongoing recovery are:: Home with PT and OT CMS Medicare.gov Compare Post Acute Care list provided to:: Patient Choice offered to / list presented to : Patient [place] ownership interest in [facility]provided to:: Parent NA    Expected Discharge Plan and Services In-house Referral: NA Discharge Planning Services: NA Post Acute Care Choice: NA Living arrangements for the past 2 months: Apartment                 DME Arranged: N/A DME Agency: NA       HH Arranged: NA HH Agency: NA        Prior Living Arrangements/Services Living arrangements for the past 2 months: Apartment Lives with:: Self Patient language and need for interpreter reviewed:: Yes Do you feel safe going back to the place where you live?: Yes      Need for Family Participation in Patient Care: Yes (Comment) Care giver support system in place?: Yes (comment) Current home services: DME (WC, Walker) Criminal Activity/Legal Involvement Pertinent to Current  Situation/Hospitalization: No - Comment as needed  Activities of Daily Living   ADL Screening (condition at time of admission) Independently performs ADLs?: Yes (appropriate for developmental age) Is the patient deaf or have difficulty hearing?: No Does the patient have difficulty seeing, even when wearing glasses/contacts?: Yes Does the patient have difficulty concentrating, remembering, or making decisions?: Yes (pt expresses this is recent not ongoing)  Permission Sought/Granted Permission sought to share information with : Case Manager Permission granted to share information with : Yes, Verbal Permission Granted  Share Information with NAME: Guilford Sober (Sister)  [phone]           Emotional Assessment Appearance:: Appears stated age Attitude/Demeanor/Rapport: Engaged, Gracious Affect (typically observed): Appropriate Orientation: : Oriented to Self, Oriented to Place, Oriented to  Time, Oriented to Situation Alcohol / Substance Use: Not Applicable Psych Involvement: No (comment)  Admission diagnosis:  UTI (urinary tract infection) [N39.0] Acute pain of right knee [M25.561] Fever, unspecified fever cause [R50.9] Urinary tract infection associated with indwelling urethral catheter, initial encounter [U16.488J, N39.0] Patient Active Problem List   Diagnosis Date Noted   Catheter-associated urinary tract infection 07/09/2024   BPH (benign prostatic hyperplasia) 07/09/2024   Right knee pain 07/09/2024   Hypokalemia 07/09/2024   Hypothyroidism 07/09/2024   GERD (gastroesophageal reflux disease) 07/09/2024   UTI (urinary tract infection) 07/09/2024   S/P arthroscopy of left knee 03/18/2018   Asbestos-induced pleural plaque 03/08/2018   Other meniscus derangements, posterior horn of  medial meniscus, left knee    Synovitis of knee    Unilateral primary osteoarthritis, left knee 11/25/2017   Complex tear of medial meniscus of left knee 10/01/2017   Chest pain  12/09/2016   Unstable angina (HCC)    Weakness of right foot 10/07/2014   Right hemiplegia (HCC) 10/06/2014   TIA (transient ischemic attack) 10/06/2014   Essential hypertension 10/06/2014   PCP:  Clinic, Bonni Lien Pharmacy:   [id number] - [place], [place] - [address] AT [facility] [address] [place] [address] Phone: [phone] Fax: [phone]  [id number] - [place], [place] - [address] [place] [address] Phone: [phone] Fax: [phone]  [facility] - [place], [place] - [address] [place] [address] Phone: [phone] Fax: [phone]     Social Drivers of Health (SDOH) Social History: SDOH Screenings   Food Insecurity: No Food Insecurity (07/09/2024)  Housing: Low Risk  (07/09/2024)  Transportation Needs: No Transportation Needs (07/09/2024)  Utilities: Not At Risk (07/09/2024)  Social Connections: Socially Isolated (07/09/2024)  Tobacco Use: Low Risk  (07/08/2024)   SDOH Interventions:     Readmission Risk Interventions    07/10/2024   10:28 AM  Readmission Risk Prevention Plan  Post Dischage Appt Complete  Medication Screening Complete  Transportation Screening Complete

## 2024-07-10 NOTE — Progress Notes (Signed)
   07/10/24 1559  Spiritual Encounters  Type of Visit Initial  Care provided to: Patient  Referral source Clinical staff  Reason for visit Advance directives  OnCall Visit No  Spiritual Framework  Presenting Themes Impactful experiences and emotions  Community/Connection Limited  Patient Stress Factors Health changes  Family Stress Factors None identified  Interventions  Spiritual Care Interventions Made Compassionate presence;Established relationship of care and support;Normalization of emotions  Intervention Outcomes  Outcomes Awareness of support;Awareness of health   Chaplain met with the Pt to discuss the Advance Directive (AD). Pt stated that he currently does not have anyone he would like to designate as his heathcare POA, but he was clear that DOES NOT want his sister Barnie Salter to make healthcare decisions on his behalf.  Chaplain reviewed the AD with Pt, Pt shared that he would like time to read through the document and consider his options. Pt stated he would follow up with Chaplain service when he is ready to complete the AD.

## 2024-07-10 NOTE — Plan of Care (Signed)
   Problem: Health Behavior/Discharge Planning: Goal: Ability to manage health-related needs will improve Outcome: Progressing   Problem: Clinical Measurements: Goal: Ability to maintain clinical measurements within normal limits will improve Outcome: Progressing Goal: Will remain free from infection Outcome: Progressing

## 2024-07-10 NOTE — Progress Notes (Signed)
 Consultation Progress Note   Patient: Ross Becker FMW:[id number] DOB: [birth year] DOA: 07/08/2024 DOS: the patient was seen and examined on 07/10/2024 Primary service: Shelma Eiben, Landon BRAVO, MD  Brief hospital course: 78 y.o. year old male with past medical history of hypertension, CAD, intermittent straight cath due to BPH, GERD, hypothyroidism, and arthritis.  He presents to [facility], ED with reports of fever, weakness, and right knee pain and swelling since Saturday.  Of note he was recently discharged from [facility], ED after presenting with dysuria and urinary frequency as well as a fall.  As he was having difficulty with straight cathing an indwelling catheter was placed on ED discharge.  Today he reports he was unable to get his discharge prescription for Keflex until yesterday and he took the first dose yesterday.  He endorses fever, fatigue, weakness, chills, and ongoing chronic knee pain.   ER- On arrival to [facility] ED patient was noted to be 36.97, BP 142/80, HR 82, RR to 15, with SpO2 100% on room air.  Plain film of R knee obtained and shows a small suprapatellar knee joint effusion.  Labs notable for UA with positive nitrates, large leukocytes, many bacteria, and >50 WBCs.  Leukocytosis with WBC 10.9, and negative lactic acid 1.0.  Blood cultures collected and in process and (R) knee joint aspiration performed and cultures collected.  He was given Rocephin. TR[facility] contacted for admission.    Assessment and Plan: #Catheter Associated Urinary Tract Infection - Empirically covered with IV Rocephin in ED, continue - Follow culture data, follow fever curve - Exchange urinary catheter   #(R) Knee Pain - Status post right knee aspiration with nucleated cell count of 50,000  -Knee  x-ray showing little to no effusion. Follow up body fluid cx -Low suspicion for infection -Ortho following   #Hypokalemia -Replete PRN   #Hypertension - Continue home Enalapril    #BPH #Urinary  Retention - Continue home flomax - Foley exchanged   #Hypothyroidism - Continue home Synthroid    #GERD -Protonix        TR[facility] will continue to follow the patient.  Subjective: Feels better this morning, denies fever, chills, nausea, vomiting. Complains of some pain on right knee. Swelling has improved compared to time of admission  Physical Exam: Vitals:   07/10/24 0435 07/10/24 0905 07/10/24 1201 07/10/24 1335  BP: 119/68 108/67 (!) 97/58 113/66  Pulse: 98 84 89 75  Resp: 19     Temp: 98 F (36.7 C)     TempSrc: Oral     SpO2: 99%  100%   Weight:      Height:       General  No acute Distress Eyes: PERRL, lids and conjunctivae normal ENMT: Mucous membranes are moist.   Neck: normal, supple, no masses, no thyromegaly Respiratory: clear to auscultation bilaterally, no wheezing, no crackles. Normal respiratory effort. No accessory muscle use.  Cardiovascular: Regular rate and rhythm, no murmurs / rubs / gallops Abdomen: Soft, nontender nondistended. Musculoskeletal: Right Knee swelling and warmth noted. Skin: no rashes, lesions, ulcers. No induration Neurologic: Facial asymmetry, moving extremity spontaneously, speech fluent. Psychiatric: Normal judgment and insight. Alert and oriented x 3. Normal mood.    Data Reviewed:    CBC    Component Value Date/Time   WBC 9.2 07/09/2024 0811   RBC 4.15 (L) 07/09/2024 0811   HGB 14.4 07/09/2024 0811   HCT 41.2 07/09/2024 0811   PLT 261 07/09/2024 0811   MCV 99.3 07/09/2024 0811   MCH  34.7 (H) 07/09/2024 0811   MCHC 35.0 07/09/2024 0811   RDW 11.5 07/09/2024 0811   LYMPHSABS 1.1 07/08/2024 2131   MONOABS 1.2 (H) 07/08/2024 2131   EOSABS 0.0 07/08/2024 2131   BASOSABS 0.0 07/08/2024 2131    CMP     Component Value Date/Time   NA 134 (L) 07/10/2024 0431   K 3.7 07/10/2024 0431   CL 100 07/10/2024 0431   CO2 26 07/10/2024 0431   GLUCOSE 93 07/10/2024 0431   BUN 12 07/10/2024 0431   CREATININE 0.79 07/10/2024 0431    CALCIUM  8.6 (L) 07/10/2024 0431   PROT 7.6 07/08/2024 2131   ALBUMIN 3.7 07/08/2024 2131   AST 27 07/08/2024 2131   ALT 12 07/08/2024 2131   ALKPHOS 110 07/08/2024 2131   BILITOT 1.0 07/08/2024 2131   GFRNONAA >60 07/10/2024 0431  I have reviewed pertinent nursing notes, vitals, labs, and images as necessary. I have ordered labwork to follow up on as indicated.  I have reviewed the last notes from staff over past 24 hours. I have discussed patient's care plan and test results with nursing staff, CM/SW, and other staff as appropriate.  Old records reviewed in assessment of this patient  Time spent: Greater than 50% of the 55 minute visit was spent in counseling/coordination of care for the patient as laid out in the A&P.   Time spent: 35 minutes.  Author: Landon FORBES Baller, MD 07/10/2024 3:29 PM  For on call review [email or web address].

## 2024-07-11 MED ORDER — DIPHENHYDRAMINE HCL 25 MG PO CAPS
25.0000 mg | ORAL_CAPSULE | Freq: Once | ORAL | Status: AC
  Administered 2024-07-11: 25 mg via ORAL
  Filled 2024-07-11: qty 1

## 2024-07-11 MED ORDER — FAMOTIDINE IN NACL 20-0.9 MG/50ML-% IV SOLN
20.0000 mg | Freq: Once | INTRAVENOUS | Status: AC
  Administered 2024-07-12: 20 mg via INTRAVENOUS
  Filled 2024-07-11: qty 50

## 2024-07-11 MED ORDER — HYALURONIDASE HUMAN 150 UNIT/ML IJ SOLN
150.0000 [IU] | Freq: Once | INTRAMUSCULAR | Status: AC
Start: 2024-07-12 — End: 2024-07-11
  Administered 2024-07-11: 150 [IU] via SUBCUTANEOUS
  Filled 2024-07-11: qty 1

## 2024-07-11 MED ORDER — DIPHENHYDRAMINE HCL 50 MG/ML IJ SOLN
25.0000 mg | Freq: Once | INTRAMUSCULAR | Status: DC
  Filled 2024-07-11: qty 1

## 2024-07-11 MED ORDER — FUROSEMIDE 10 MG/ML IJ SOLN
40.0000 mg | Freq: Once | INTRAMUSCULAR | Status: AC
  Administered 2024-07-11: 40 mg via INTRAVENOUS
  Filled 2024-07-11: qty 4

## 2024-07-11 NOTE — Evaluation (Addendum)
 Occupational Therapy Evaluation Patient Details Name: Ross Becker MRN: [id number] DOB: 1946/02/25 Today's Date: 07/11/2024   History of Present Illness   78 y.o. year old male   He presents to [facility] ED with reports of fever, weakness, and right knee pain and swelling since Saturday.  Of note he was recently DC 07/04/24 from [facility], ED after presenting with dysuria and urinary frequency as well as a fall. with past medical history of hypertension, CAD, intermittent straight cath due to BPH, GERD, hypothyroidism, and arthritis.     Clinical Impressions This 78 yo old male admitted with above presents to acute OT with PLOF of being in a wheelchair the last several days due to knee pain and only doing transfers and has fallen. He currently is setup-mod A for basic ADLs and will continue to benefit from acute OT with follow up from inpatient follow up therapy, <3 hours/day (which he is agreeing to presently), if he continues to not agree than HHOT.      If plan is discharge home, recommend the following:   A lot of help with walking and/or transfers;A lot of help with bathing/dressing/bathroom;Assistance with cooking/housework;Assist for transportation;Help with stairs or ramp for entrance     Functional Status Assessment   Patient has had a recent decline in their functional status and demonstrates the ability to make significant improvements in function in a reasonable and predictable amount of time.     Equipment Recommendations   Tub/shower bench;Other (comment) (Drop arm BSC. Adaptive Equipment: reacher, wide sock aid, long handled sponge, long handled shoe horn, dressing stick)      Precautions/Restrictions   Precautions Precautions: None Restrictions Weight Bearing Restrictions Per Provider Order: No     Mobility Bed Mobility Overal bed mobility: Modified Independent             General bed mobility comments: increased time, HOB up, and use  of rails        Balance Overall balance assessment:  (no balance issues in sitting)                                         ADL either performed or assessed with clinical judgement   ADL Overall ADL's : Needs assistance/impaired Eating/Feeding: Independent;Sitting Eating/Feeding Details (indicate cue type and reason): EOB Grooming: Set up;Sitting Grooming Details (indicate cue type and reason): EOB Upper Body Bathing: Set up;Sitting Upper Body Bathing Details (indicate cue type and reason): EOB Lower Body Bathing: Sitting/lateral leans;Moderate assistance Lower Body Bathing Details (indicate cue type and reason): EOB Upper Body Dressing : Set up;Sitting Upper Body Dressing Details (indicate cue type and reason): EOB Lower Body Dressing: Moderate assistance;Sitting/lateral leans Lower Body Dressing Details (indicate cue type and reason): EOB                     Vision Patient Visual Report: No change from baseline              Pertinent Vitals/Pain Pain Assessment Pain Assessment: 0-10 Pain Score: 5  Pain Location: Right knee Pain Descriptors / Indicators: Aching, Sore, Discomfort Pain Intervention(s): Limited activity within patient's tolerance, Monitored during session, Repositioned     Extremity/Trunk Assessment Upper Extremity Assessment Upper Extremity Assessment: Overall WFL for tasks assessed           Communication Communication Communication: No apparent difficulties   Cognition Arousal: Alert  Behavior During Therapy: [facility] for tasks assessed/performed                                 Following commands: Intact       Cueing    Cueing Techniques: Verbal cues              Home Living Family/patient expects to be discharged to:: Private residence Living Arrangements: Alone (getting arrangement set up for assistant. Lives with his cat) Available Help at Discharge:  (Plans to get assistance from the [place]  for home health nursing) Type of Home: Apartment Home Access: Level entry     Home Layout: One level     Bathroom Shower/Tub: Chief Strategy Officer: Standard     Home Equipment: Wheelchair - Forensic Psychologist (2 wheels)   Additional Comments: Patient reports he needs a new wheelchair. Current wheelchair does not have leg rest      Prior Functioning/Environment Prior Level of Function : Independent/Modified Independent;History of Falls (last six months)             Mobility Comments: He has been using a wheelchair to get around due to right knee pain. He transfers in and out of bed and to the toilet via the wheelchair independently.  Reports 2 -3 falls. Most recent fall was last week. He was in his bathroom. His commode seat was loose and he fell better toilet and wheelchair. ADLs Comments: Mod from W/C standpoint    OT Problem List: Decreased range of motion;Decreased strength;Impaired balance (sitting and/or standing);Pain   OT Treatment/Interventions: Self-care/ADL training;DME and/or AE instruction;Balance training;Patient/family education      OT Goals(Current goals can be found in the care plan section)   Acute Rehab OT Goals Patient Stated Goal: to not go to rehab because of his cat at home OT Goal Formulation: With patient Time For Goal Achievement: 07/25/24 Potential to Achieve Goals: Good ADL Goals Pt Will Perform Grooming: Independently;sitting Pt Will Perform Lower Body Bathing: with modified independence;with adaptive equipment;sitting/lateral leans;sit to/from stand Pt Will Perform Lower Body Dressing: with modified independence;with adaptive equipment;sitting/lateral leans;sit to/from stand Pt Will Transfer to Toilet: with modified independence;squat pivot transfer;with transfer board;regular height toilet Pt Will Perform Toileting - Clothing Manipulation and hygiene: with modified independence;sitting/lateral leans;sit to/from  stand Additional ADL Goal #1: Pt will Mod I in and OOB for basic ADLs (no rail, HOB flat, increased time)   OT Frequency:  Min 2X/week       AM-PAC OT 6 Clicks Daily Activity     Outcome Measure Help from another person eating meals?: None Help from another person taking care of personal grooming?: A Little Help from another person toileting, which includes using toliet, bedpan, or urinal?: A Lot Help from another person bathing (including washing, rinsing, drying)?: A Lot Help from another person to put on and taking off regular upper body clothing?: A Little Help from another person to put on and taking off regular lower body clothing?: A Lot 6 Click Score: 16   End of Session    Activity Tolerance: Patient limited by pain Patient left: in bed;with call bell/phone within reach;with bed alarm set  OT Visit Diagnosis: Other abnormalities of gait and mobility (R26.89);Pain Pain - Right/Left:  (both (R>L))                Time: 1540-1556 OT Time Calculation (min): 16 min Charges:  OT General Charges $  OT Visit: 1 Visit OT Evaluation $OT Eval Moderate Complexity: 1 Mod  Cathy L. OT Acute Rehabilitation Services Office [phone]    [place] Dorothyann Distel 07/11/2024, 5:22 PM

## 2024-07-11 NOTE — Progress Notes (Signed)
 Progress Note   Patient: Ross Becker FMW:[id number] DOB: [birth year] DOA: 07/08/2024  DOS: the patient was seen and examined on 07/11/2024   Brief hospital course:  78 y.o. year old male with past medical history of hypertension, CAD, intermittent straight cath due to BPH with recent urinary obstruction, GERD, hypothyroidism, arthritis. He presents to [facility], ED with reports of fever, weakness, and right knee pain and swelling.   Assessment and Plan:  Catheter associated urinary tract infection - Recently had Foley catheter placed at prior ED visit, was given Keflex however was unable to fill promptly.  Presenting with worsening fever pain.  UA noting LE, nitrates, WBCs, bacteria.  Catheter was exchanged.  Empiric broad-spectrum antibiotics on board (ceftriaxone plus vancomycin).  Mild leukocytosis improved.  Showing improvement.  Awaiting urine culture.  Right knee pain with small effusion - S/p arthrocentesis by orthopedic surgery.  Cell count 50K with 96% neutrophils.  Etiology leaning more towards gout/inflammatory versus infectious.  Awaiting synovial fluid cultures.  Continues on antibiotics as above.  Acute urinary retention with BPH (POA) - Foley catheter exchanged on presentation.  Flomax on board.  Hypokalemia - Replenishment on board.  Recheck BMP in AM.  Hypertension - Enalapril  on board.  Hypothyroidism/GERD - Resume home medication regiment.  Physical debilitation muscle weakness - Likely exacerbated by above.  Evaluated by physical therapy.  Recommending STR.   Subjective: Patient resting comfortably this morning.  Still feels a bit weak.  Denies any fevers, chills, chest pain, nausea, vomiting, abdominal pain.  Still feels a bit of malaise as well.  Physical Exam:  Vitals:   07/11/24 0427 07/11/24 0917 07/11/24 0918 07/11/24 1231  BP: 131/75 128/71 128/71 114/80  Pulse: 97 81  94  Resp: 17     Temp: 98.3 F (36.8 C)   98.2 F (36.8 C)   TempSrc: Oral   Oral  SpO2: 98%   99%  Weight:      Height:        GENERAL:  Alert, pleasant, no acute distress  HEENT:  EOMI CARDIOVASCULAR:  RRR, no murmurs appreciated RESPIRATORY:  Clear to auscultation, no wheezing, rales, or rhonchi GASTROINTESTINAL:  Soft, nontender, nondistended EXTREMITIES:  No LE edema bilaterally NEURO:  No new focal deficits appreciated SKIN:  No rashes noted PSYCH:  Appropriate mood and affect     Data Reviewed:  Imaging Studies: CT ABDOMEN WO CONTRAST Result Date: 07/09/2024 EXAM: CT ABDOMEN WITHOUT CONTRAST 07/09/2024 04:13:44 PM TECHNIQUE: CT of the abdomen was performed without the administration of intravenous contrast. Multiplanar reformatted images are provided for review. Automated exposure control, iterative reconstruction, and/or weight based adjustment of the mA/kV was utilized to reduce the radiation dose to as low as reasonably achievable. COMPARISON: 05/01/2014 CLINICAL HISTORY: Flank pain, possible pyelonephritis. FINDINGS: LOWER CHEST: Lung bases are within normal limits. No focal infiltrate is seen. Some calcified pleural plaques are again seen. HEPATOBILIARY: Gallbladder has been surgically removed. Liver is within normal limits. SPLEEN: Spleen demonstrates no acute abnormality. PANCREAS: Pancreas demonstrates no acute abnormality. ADRENAL GLANDS: Adrenal glands demonstrate no acute abnormality. KIDNEYS: The left kidney is not visualized, consistent with congenital absence, stable from the prior exam. The right kidney demonstrates no hydronephrosis or renal calculi. No focal mass is seen. No findings to suggest pyelonephritis are noted. GI AND BOWEL: The colon demonstrates mild diverticulosis without diverticulitis. The stomach and small bowel are within normal limits. There is no bowel obstruction. PERITONEUM AND RETROPERITONEUM: No ascites or free air. Atherosclerotic calcifications of  the aorta are noted without aneurysmal dilatation. LYMPH  NODES: No lymphadenopathy. BONES AND SOFT TISSUES: Degenerative changes of the lumbar spine are seen. No acute bony abnormality is noted. IMPRESSION: 1. No CT evidence of pyelonephritis. 2. Congenital absence of the left kidney, stable from the prior exam. 3. Aortic atherosclerosis without aneurysmal dilatation. Electronically signed by: Oneil Devonshire MD 07/09/2024 07:34 PM EST RP Workstation: [id number]   DG Knee Complete 4 Views Right Result Date: 07/08/2024 EXAM: 4 VIEW(S) XRAY OF THE RIGHT KNEE 07/08/2024 11:33:00 PM COMPARISON: None available. CLINICAL HISTORY: swelling and pain FINDINGS: BONES AND JOINTS: No acute fracture. No focal osseous lesion. No joint dislocation. Small suprapatellar knee joint effusion. Very mild tricompartmental degenerative changes. SOFT TISSUES: The soft tissues are unremarkable. IMPRESSION: 1. No acute findings. 2. Small suprapatellar knee joint effusion. Electronically signed by: Pinkie Pebbles MD 07/08/2024 11:35 PM EST RP Workstation: [id number]   DG Ribs Unilateral W/Chest Right Result Date: 07/03/2024 CLINICAL DATA:  Fall EXAM: RIGHT RIBS AND CHEST - 3+ VIEW COMPARISON:  08/05/2019 FINDINGS: Single view chest demonstrates no focal opacity or pleural effusion. Normal cardiac size. No pneumothorax. Right rib series demonstrates no definitive displaced right rib fracture., question erosion right eleventh and twelfth ribs. IMPRESSION: 1. No active cardiopulmonary disease. 2. No definitive displaced right rib fracture. Question erosion of the right eleventh and twelfth ribs. This may be correlated with chest CT Electronically Signed   By: Luke Bun M.D.   On: 07/03/2024 22:26    There are no new results to review at this time.  Previous records (including but not limited to H&P, progress notes, nursing notes, TOC management) were reviewed in assessment of this patient.  Labs: CBC: Recent Labs  Lab 07/08/24 2131 07/09/24 0811  WBC 10.9* 9.2  NEUTROABS 8.6*   --   HGB 14.8 14.4  HCT 43.2 41.2  MCV 99.1 99.3  PLT 263 261   Basic Metabolic Panel: Recent Labs  Lab 07/08/24 2131 07/09/24 0811 07/10/24 0431  NA 135 136 134*  K 3.5 3.3* 3.7  CL 96* 98 100  CO2 27 27 26   GLUCOSE 105* 115* 93  BUN 14 11 12   CREATININE 0.94 0.80 0.79  CALCIUM  9.7 9.4 8.6*   Liver Function Tests: Recent Labs  Lab 07/08/24 2131  AST 27  ALT 12  ALKPHOS 110  BILITOT 1.0  PROT 7.6  ALBUMIN 3.7   CBG: No results for input(s): GLUCAP in the last 168 hours.  Scheduled Meds:  Chlorhexidine  Gluconate Cloth  6 each Topical Daily   enalapril   40 mg Oral Daily   enoxaparin  (LOVENOX ) injection  40 mg Subcutaneous QHS   fluticasone  1 spray Each Nare QODAY   gabapentin  200 mg Oral QHS   levothyroxine   50 mcg Oral Q0600   pantoprazole   40 mg Oral Daily   tamsulosin  0.4 mg Oral Daily   Continuous Infusions:  cefTRIAXone (ROCEPHIN)  IV 2 g (07/11/24 0104)   vancomycin 1,000 mg (07/11/24 1029)   PRN Meds:.acetaminophen  **OR** acetaminophen , albuterol , diphenhydrAMINE, HYDROcodone -acetaminophen , melatonin, ondansetron  **OR** ondansetron  (ZOFRAN ) IV, polyethylene glycol  Family Communication: None at bedside  Disposition: Status is: Inpatient Remains inpatient appropriate because: Catheter associated UTI     Time spent: 41 minutes  Length of inpatient stay: 2 days  Author: Carliss LELON Canales, DO 07/11/2024 12:54 PM  For on call review [email or web address].

## 2024-07-11 NOTE — Plan of Care (Signed)

## 2024-07-11 NOTE — Hospital Course (Signed)
 78 y.o. year old male with past medical history of hypertension, CAD, intermittent straight cath due to BPH with recent urinary obstruction, GERD, hypothyroidism, arthritis. He presents to [facility] ED with reports of fever, weakness, and right knee pain and swelling.    Assessment and Plan:   Catheter associated urinary tract infection - Recently had Foley catheter placed at prior ED visit, was given Keflex however was unable to fill promptly.  Presenting with worsening fever pain.  UA noting LE, nitrates, WBCs, bacteria.  Catheter was exchanged.  Empiric broad-spectrum antibiotics on board (ceftriaxone plus vancomycin).  Mild leukocytosis improved.  Showing improvement.  Awaiting urine culture.   Right knee pain with small effusion - S/p arthrocentesis by orthopedic surgery.  Cell count 50K with 96% neutrophils.  Etiology leaning more towards gout/inflammatory versus infectious.  Awaiting synovial fluid cultures.  Continues on antibiotics as above.   Acute urinary retention with BPH (POA) - Foley catheter exchanged on presentation.  Flomax on board.   Hypokalemia - Replenishment on board.  Recheck BMP in AM.   Hypertension - Enalapril  on board.   Hypothyroidism/GERD - Resume home medication regiment.   Physical debilitation muscle weakness - Likely exacerbated by above.  Evaluated by physical therapy.  Recommending STR.

## 2024-07-11 NOTE — Evaluation (Addendum)
 Physical Therapy Evaluation Patient Details Name: Ross Becker MRN: [id number] DOB: [birth year] Today's Date: 07/11/2024  History of Present Illness  78 y.o. year old male   He presents to [facility] ED with reports of fever, weakness, and right knee pain and swelling since Saturday.  Of note he was recently DC 07/04/24 from [facility], ED after presenting with dysuria and urinary frequency as well as a fall. with past medical history of hypertension, CAD, intermittent straight cath due to BPH, GERD, hypothyroidism, and arthritis.  Clinical Impression  Patient was able to transfer from supine to sit mod independent using the bedrails and the HOB elevated. He has a history of peripheral neuropathy so sensation is decreased at baseline. Sit to stand transfer patient required mod A and was not able to stand upright due to pain. Verbalized 5-6/10 pain when standing. Patient did not want to take any steps with fears of increasing right knee pain. Transferred patient from bed to recliner with use of [employer]. Patient reports he has not walked in 6 days since is issues started. He had been using a wheelchair to get around, but at baseline he ambulates with a front wheeled walker.         If plan is discharge home, recommend the following: Assistance with cooking/housework;A lot of help with walking and/or transfers;A lot of help with bathing/dressing/bathroom   Can travel by private vehicle   No    Equipment Recommendations  (Patient's current wheelchair is in poor condition)  Recommendations for Other Services       Functional Status Assessment Patient has had a recent decline in their functional status and demonstrates the ability to make significant improvements in function in a reasonable and predictable amount of time.     Precautions / Restrictions Precautions Precautions: None Restrictions Weight Bearing Restrictions Per Provider Order: No      Mobility  Bed  Mobility Overal bed mobility: Modified Independent             General bed mobility comments: Used the hand rails and HOB elevated for supine to sit transfer    Transfers Overall transfer level: Needs assistance   Transfers: Bed to chair/wheelchair/BSC             General transfer comment: Completed sit to stand from bed with Mod A. Noted flexed posture patient was unable to stand upright; Due to pain in right knee patient was unable to take steps. Used sara lift to transfer patient from bed into the recliner. Transfer via Lift Equipment: [employer] Account Executive Bed    Modified Rankin (Stroke Patients Only)       Balance Overall balance assessment: History of Falls                                           Pertinent Vitals/Pain Pain Assessment Pain Assessment: 0-10 Pain Score: 5  Pain Location: Right knee Pain Descriptors / Indicators:  (discomfort) Pain Intervention(s): Monitored during session    Home Living Family/patient expects to be discharged to:: Private residence Living Arrangements: Alone (getting arrangement set up for assistant. Lives with his cat) Available Help at Discharge:  (Plans to  get assistance from the [place] for home health nursing) Type of Home: Apartment Home Access: Level entry       Home Layout: One level Home Equipment: Wheelchair - Forensic Psychologist (2 wheels) Additional Comments: Patient reports he needs a new wheelchair. Current wheelchair does not have leg rest    Prior Function Prior Level of Function : Independent/Modified Independent;History of Falls (last six months)             Mobility Comments: He has been using a wheelchair to get around due to right knee pain. He transfers in and out of bed and to the toilet via the wheelchair independently.  Reports 2 -3 falls. Most recent fall was last week. He was in his  bathroom. His commode seat was loose and he fell better toilet and wheelchair.       Extremity/Trunk Assessment        Lower Extremity Assessment Lower Extremity Assessment: RLE deficits/detail;LLE deficits/detail RLE Deficits / Details: Limited right knee AROM. In supine with approx 10 deg of flexion; edema noted in knee joint RLE Sensation: history of peripheral neuropathy;decreased light touch LLE Deficits / Details: Pitting edema in left LE LLE Sensation: history of peripheral neuropathy;decreased light touch    Cervical / Trunk Assessment Cervical / Trunk Assessment: Kyphotic (scoliosis)  Communication   Communication Communication: No apparent difficulties    Cognition Arousal: Alert Behavior During Therapy: WFL for tasks assessed/performed   PT - Cognitive impairments: No apparent impairments                         Following commands: Intact       Cueing Cueing Techniques: Verbal cues     General Comments      Exercises     Assessment/Plan    PT Assessment Patient needs continued PT services  PT Problem List Decreased range of motion;Decreased strength;Decreased activity tolerance;Decreased balance;Decreased mobility;Pain;Impaired sensation       PT Treatment Interventions DME instruction;Balance training;Gait training;Patient/family education;Therapeutic activities;Therapeutic exercise;Wheelchair mobility training    PT Goals (Current goals can be found in the Care Plan section)  Acute Rehab PT Goals Patient Stated Goal: to get stronger PT Goal Formulation: With patient    Frequency Min 2X/week     Co-evaluation               AM-PAC PT 6 Clicks Mobility  Outcome Measure Help needed turning from your back to your side while in a flat bed without using bedrails?: A Little Help needed moving from lying on your back to sitting on the side of a flat bed without using bedrails?: A Little Help needed moving to and from a bed to a  chair (including a wheelchair)?: Total Help needed standing up from a chair using your arms (e.g., wheelchair or bedside chair)?: A Lot Help needed to walk in hospital room?: Total Help needed climbing 3-5 steps with a railing? : Total 6 Click Score: 11    End of Session Equipment Utilized During Treatment: Gait belt;Other (comment) Jayson Lift) Activity Tolerance: Patient limited by pain Patient left: in chair;with chair alarm set;with call bell/phone within reach Nurse Communication: Need for lift equipment PT Visit Diagnosis: History of falling (Z91.81);Other abnormalities of gait and mobility (R26.89);Muscle weakness (generalized) (M62.81)    Time: 9072-8997 PT Time Calculation (min) (ACUTE ONLY): 35 min   Charges:   PT Evaluation $PT Eval Moderate Complexity: 1 Mod PT Treatments $Therapeutic Activity: 8-22 mins PT General Charges $$  ACUTE PT VISIT: 1 Visit         Kristeen Sar, PT, DPT 07/11/24 12:30 PM

## 2024-07-12 DIAGNOSIS — T83511A Infection and inflammatory reaction due to indwelling urethral catheter, initial encounter: Secondary | ICD-10-CM | POA: Diagnosis not present

## 2024-07-12 DIAGNOSIS — E039 Hypothyroidism, unspecified: Secondary | ICD-10-CM

## 2024-07-12 DIAGNOSIS — M25561 Pain in right knee: Secondary | ICD-10-CM | POA: Diagnosis not present

## 2024-07-12 DIAGNOSIS — K219 Gastro-esophageal reflux disease without esophagitis: Secondary | ICD-10-CM

## 2024-07-12 DIAGNOSIS — I10 Essential (primary) hypertension: Secondary | ICD-10-CM

## 2024-07-12 DIAGNOSIS — N400 Enlarged prostate without lower urinary tract symptoms: Secondary | ICD-10-CM | POA: Diagnosis not present

## 2024-07-12 DIAGNOSIS — E876 Hypokalemia: Secondary | ICD-10-CM

## 2024-07-12 LAB — BODY FLUID CULTURE W GRAM STAIN: Culture: NO GROWTH

## 2024-07-12 LAB — BASIC METABOLIC PANEL WITH GFR
Anion gap: 8 (ref 5–15)
BUN: 13 mg/dL (ref 8–23)
CO2: 27 mmol/L (ref 22–32)
Calcium: 9 mg/dL (ref 8.9–10.3)
Chloride: 98 mmol/L (ref 98–111)
Creatinine, Ser: 0.93 mg/dL (ref 0.61–1.24)
GFR, Estimated: >60 mL/min (ref >60)
Glucose, Bld: 102 mg/dL — ABNORMAL HIGH (ref 70–99)
Potassium: 3.7 mmol/L (ref 3.5–5.1)
Sodium: 133 mmol/L — ABNORMAL LOW (ref 135–145)

## 2024-07-12 LAB — CBC
HCT: 35.6 % — ABNORMAL LOW (ref 39.0–52.0)
Hemoglobin: 12.4 g/dL — ABNORMAL LOW (ref 13.0–17.0)
MCH: 34.1 pg — ABNORMAL HIGH (ref 26.0–34.0)
MCHC: 34.8 g/dL (ref 30.0–36.0)
MCV: 97.8 fL (ref 80.0–100.0)
Platelets: 323 K/uL (ref 150–400)
RBC: 3.64 MIL/uL — ABNORMAL LOW (ref 4.22–5.81)
RDW: 11.2 % — ABNORMAL LOW (ref 11.5–15.5)
WBC: 7.8 K/uL (ref 4.0–10.5)
nRBC: 0 % (ref 0.0–0.2)

## 2024-07-12 LAB — MAGNESIUM: Magnesium: 1.5 mg/dL — ABNORMAL LOW (ref 1.7–2.4)

## 2024-07-12 LAB — URIC ACID: Uric Acid, Serum: 5.5 mg/dL (ref 3.7–8.6)

## 2024-07-12 MED ORDER — ENALAPRIL MALEATE 10 MG PO TABS
10.0000 mg | ORAL_TABLET | Freq: Every day | ORAL | Status: DC
  Filled 2024-07-12: qty 1

## 2024-07-12 MED ORDER — NAPROXEN 250 MG PO TABS
500.0000 mg | ORAL_TABLET | Freq: Two times a day (BID) | ORAL | Status: DC
  Administered 2024-07-12 – 2024-07-14 (×3): 500 mg via ORAL
  Filled 2024-07-12 (×3): qty 2

## 2024-07-12 MED ORDER — LINEZOLID 600 MG PO TABS
600.0000 mg | ORAL_TABLET | Freq: Two times a day (BID) | ORAL | Status: DC
  Administered 2024-07-12 – 2024-07-16 (×8): 600 mg via ORAL
  Filled 2024-07-12 (×8): qty 1

## 2024-07-12 NOTE — Progress Notes (Signed)
     Patient Name: Ross Becker           DOB: [birth year]  MRN: [id number]      Admission Date: 07/08/2024  Attending Provider: Arlon Carliss LELON, DO  Primary Diagnosis: Catheter-associated urinary tract infection   Level of care: Med-Surg   OVERNIGHT EVENT   HPI/ Events of Note  Ross Becker, 78 y.o. male, was admitted on 07/08/2024 for Catheter-associated urinary tract infection.  Notified by bedside RN that the patient developed facial redness/ rash while receiving IV vancomycin.  Vancomycin infusion was stopped.  At bedside, pt is resting and is in no acute distress. Vital signs are stable.  He reports feeling flushed and also reports generalized itchiness, which started prior to medication administration.   He denies worsening itchiness. No hives were noted. No redness of the neck, torso, or back observed.     The patient denies SOB, and breath sounds are clear and even on room air. No angioedema or lip swelling is present.  Additionally, there is possible vancomycin extravasation at the IV site, with mild erythema and tenderness. Will treat with hyaluronidase.  Plan: Reaction during vancomycin infusion is possibly Red Man Syndrome. Will treat with Benadryl and Pepcid.    If facial redness improves; plan to resume vancomycin at a slower infusion rate. Hyaluronidase    Brittanny Levenhagen, DNP, ACNPC- AG [facility] [place]

## 2024-07-12 NOTE — Plan of Care (Signed)
?  Problem: Health Behavior/Discharge Planning: ?Goal: Ability to manage health-related needs will improve ?Outcome: Progressing ?  ?Problem: Activity: ?Goal: Risk for activity intolerance will decrease ?Outcome: Progressing ?  ?Problem: Elimination: ?Goal: Will not experience complications related to bowel motility ?Outcome: Progressing ?  ?

## 2024-07-12 NOTE — Progress Notes (Signed)
 PROGRESS NOTE  Ross Becker FMW:[id number] DOB: 1946/06/09   PCP: [facility]  Patient is from: Home.  DOA: 07/08/2024 LOS: 3  Chief complaints Chief Complaint  Patient presents with   Knee Pain    Right    Fever     Brief Narrative / Interim history: 78 year old M with PMH of CAD, HTN, BPH/urinary tension with intermittent self cath, GERD, hypothyroidism, arthritis and gout presented to ED with fever, weakness, right knee pain and swelling, and admitted with catheter associated urinary tract infection and right knee pain with effusion.  Patient had indwelling Foley catheter placed during his recent visit to ED on 11/7 due to having difficulty with straight cath.   In ED, stable vitals.  WBC 10.9.  Otherwise, CMP and CBC without significant finding.  Lactic acid negative.  UA with pyuria, bacteriuria and nitrites.  Blood culture sent.  Started on ceftriaxone and vancomycin.  Orthopedic surgery consulted.  Patient had arthrocentesis of right knee on 11/13.  Synovial fluid with 50K WBC with 96% neutrophils.  No crystals.  Blood and synovial fluid cultures NGTD.  Subjective: Seen and examined earlier this morning.  Looks like he had red man syndrome with vancomycin last night.  Continues to endorse right knee pain and swelling.  Worse with movement.   Assessment and plan: Catheter associated urinary tract infection-UA with pyuria, bacteriuria and nitrite.  Unfortunately, urine culture was not sent.  Prior urine culture on 11/7 with MSSA.  No history of MDR.  Foley catheter exchanged in ED. -Continue IV ceftriaxone.  - Change vancomycin to Zyvox given possible red man syndrome.  Right knee pain/effusion: No report of trauma. S/p arthrocentesis on 11/13.  Synovial fluid with 50,000 WBC with 96% neutrophils but no crystals.  Cultures NGTD.  Still with significant effusion and limited ROM.  Uric acid only 5.5. -Continue ceftriaxone and Zyvox as above. -Will follow  further recommendation by Ortho -Start Aleve   Acute urinary retention with BPH (POA) -Foley catheter exchanged on presentation.   -Flomax on board.  Essential hypertension: Soft BP - Decrease enalapril .   Hypokalemia/hypomagnesemia -Monitor replenish K and Mg as appropriate   Hypothyroidism/GERD -Continue home Synthroid .   Physical debilitation muscle weakness: Likely exacerbated by above.  -Therapy recommending STR.  Body mass index is 22.88 kg/m.           DVT prophylaxis:  enoxaparin  (LOVENOX ) injection 40 mg Start: 07/09/24 2200  Code Status: Full code Family Communication: None at bedside Level of care: Med-Surg Status is: Inpatient Remains inpatient appropriate because: UTI and right knee pain/swelling   Final disposition: SNF   55 minutes with more than 50% spent in reviewing records, counseling patient/family and coordinating care.  Consultants:  Orthopedic surgery  Procedures: 11/13-right knee arthrocentesis  Microbiology summarized: 11/13-synovial fluid culture NGTD 11/13-blood cultures NGTD  Objective: Vitals:   07/11/24 1231 07/11/24 2006 07/12/24 0517 07/12/24 1027  BP: 114/80 114/65 (!) 113/56 (!) 113/56  Pulse: 94 94 85   Resp:  19 18   Temp: 98.2 F (36.8 C) 97.9 F (36.6 C) 98.1 F (36.7 C)   TempSrc: Oral  Oral   SpO2: 99% 96% 96%   Weight:      Height:        Examination:  GENERAL: No apparent distress.  Nontoxic. HEENT: MMM.  Vision and hearing grossly intact.  NECK: Supple.  No apparent JVD.  RESP:  No IWOB.  Fair aeration bilaterally. CVS:  RRR. Heart sounds normal.  ABD/GI/GU:  BS+. Abd soft, NTND.  Foley catheter in place. MSK/EXT:  Moves extremities.  Limited ROM in right knee.  Tenderness across joint line.  Small effusion.  Bilateral pedal edema. SKIN: no apparent skin lesion or wound NEURO: AA.  Oriented appropriately.  No apparent focal neuro deficit. PSYCH: Calm. Normal affect.   Sch Meds:  Scheduled  Meds:  Chlorhexidine  Gluconate Cloth  6 each Topical Daily   [START ON 07/13/2024] enalapril   10 mg Oral Daily   enoxaparin  (LOVENOX ) injection  40 mg Subcutaneous QHS   fluticasone  1 spray Each Nare QODAY   gabapentin  200 mg Oral QHS   levothyroxine   50 mcg Oral Q0600   linezolid  600 mg Oral Q12H   naproxen  500 mg Oral BID WC   pantoprazole   40 mg Oral Daily   tamsulosin  0.4 mg Oral Daily   Continuous Infusions:  cefTRIAXone (ROCEPHIN)  IV 2 g (07/12/24 0505)   PRN Meds:.acetaminophen  **OR** acetaminophen , albuterol , diphenhydrAMINE, HYDROcodone -acetaminophen , melatonin, ondansetron  **OR** ondansetron  (ZOFRAN ) IV, polyethylene glycol  Antimicrobials: Anti-infectives (From admission, onward)    Start     Dose/Rate Route Frequency Ordered Stop   07/12/24 2200  linezolid (ZYVOX) tablet 600 mg        600 mg Oral Every 12 hours 07/12/24 1244     07/10/24 0700  vancomycin (VANCOCIN) IVPB 1000 mg/200 mL premix  Status:  Discontinued       Note to Pharmacy: Administration duration and rate are locked-  unable to edit them.  Placed in Followed by Linked Group   1,000 mg 200 mL/hr over 60 Minutes Intravenous Every 12 hours 07/09/24 1759 07/12/24 0809   07/09/24 2330  cefTRIAXone (ROCEPHIN) 2 g in sodium chloride  0.9 % 100 mL IVPB        2 g 200 mL/hr over 30 Minutes Intravenous Every 24 hours 07/09/24 0805     07/09/24 1900  vancomycin (VANCOREADY) IVPB 1500 mg/300 mL       Placed in Followed by Linked Group   1,500 mg 150 mL/hr over 120 Minutes Intravenous  Once 07/09/24 1759 07/09/24 2252   07/08/24 2345  cefTRIAXone (ROCEPHIN) 2 g in sodium chloride  0.9 % 100 mL IVPB        2 g 200 mL/hr over 30 Minutes Intravenous  Once 07/08/24 2337 07/09/24 0012        I have personally reviewed the following labs and images: CBC: Recent Labs  Lab 07/08/24 2131 07/09/24 0811 07/12/24 0449  WBC 10.9* 9.2 7.8  NEUTROABS 8.6*  --   --   HGB 14.8 14.4 12.4*  HCT 43.2 41.2 35.6*   MCV 99.1 99.3 97.8  PLT 263 261 323   BMP &GFR Recent Labs  Lab 07/08/24 2131 07/09/24 0811 07/10/24 0431 07/12/24 0449  NA 135 136 134* 133*  K 3.5 3.3* 3.7 3.7  CL 96* 98 100 98  CO2 27 27 26 27   GLUCOSE 105* 115* 93 102*  BUN 14 11 12 13   CREATININE 0.94 0.80 0.79 0.93  CALCIUM  9.7 9.4 8.6* 9.0  MG  --   --   --  1.5*   Estimated Creatinine Clearance: 70 mL/min (by C-G formula based on SCr of 0.93 mg/dL). Liver & Pancreas: Recent Labs  Lab 07/08/24 2131  AST 27  ALT 12  ALKPHOS 110  BILITOT 1.0  PROT 7.6  ALBUMIN 3.7   No results for input(s): LIPASE, AMYLASE in the last 168 hours. No results for input(s): AMMONIA  in the last 168 hours. Diabetic: No results for input(s): HGBA1C in the last 72 hours. No results for input(s): GLUCAP in the last 168 hours. Cardiac Enzymes: No results for input(s): CKTOTAL, CKMB, CKMBINDEX, TROPONINI in the last 168 hours. No results for input(s): PROBNP in the last 8760 hours. Coagulation Profile: No results for input(s): INR, PROTIME in the last 168 hours. Thyroid  Function Tests: No results for input(s): TSH, T4TOTAL, FREET4, T3FREE, THYROIDAB in the last 72 hours. Lipid Profile: No results for input(s): CHOL, HDL, LDLCALC, TRIG, CHOLHDL, LDLDIRECT in the last 72 hours. Anemia Panel: No results for input(s): VITAMINB12, FOLATE, FERRITIN, TIBC, IRON, RETICCTPCT in the last 72 hours. Urine analysis:    Component Value Date/Time   COLORURINE YELLOW 07/08/2024 2135   APPEARANCEUR CLOUDY (A) 07/08/2024 2135   LABSPEC 1.010 07/08/2024 2135   PHURINE 5.0 07/08/2024 2135   GLUCOSEU NEGATIVE 07/08/2024 2135   HGBUR MODERATE (A) 07/08/2024 2135   BILIRUBINUR NEGATIVE 07/08/2024 2135   KETONESUR NEGATIVE 07/08/2024 2135   PROTEINUR NEGATIVE 07/08/2024 2135   UROBILINOGEN 0.2 10/07/2014 0007   NITRITE POSITIVE (A) 07/08/2024 2135   LEUKOCYTESUR LARGE (A) 07/08/2024 2135    Sepsis Labs: Invalid input(s): PROCALCITONIN, LACTICIDVEN  Microbiology: Recent Results (from the past 240 hours)  Urine Culture     Status: Abnormal   Collection Time: 07/03/24 10:49 PM   Specimen: Urine, Random  Result Value Ref Range Status   Specimen Description   Final    URINE, RANDOM Performed at [facility], [address], [place], [place] [address]    Special Requests   Final    NONE Reflexed from [id number] Performed at [facility], [address], [place], [place] [address]    Culture >=100,000 COLONIES/mL STAPHYLOCOCCUS AUREUS (A)  Final   Report Status 07/06/2024 FINAL  Final   Organism ID, Bacteria STAPHYLOCOCCUS AUREUS (A)  Final      Susceptibility   Staphylococcus aureus - MIC*    CIPROFLOXACIN <=0.5 SENSITIVE Sensitive     GENTAMICIN <=0.5 SENSITIVE Sensitive     NITROFURANTOIN <=16 SENSITIVE Sensitive     OXACILLIN <=0.25 SENSITIVE Sensitive     TETRACYCLINE <=1 SENSITIVE Sensitive     VANCOMYCIN 1 SENSITIVE Sensitive     TRIMETH/SULFA <=10 SENSITIVE Sensitive     RIFAMPIN <=0.5 SENSITIVE Sensitive     Inducible Clindamycin  NEGATIVE Sensitive     LINEZOLID 2 SENSITIVE Sensitive     * >=100,000 COLONIES/mL STAPHYLOCOCCUS AUREUS  Blood culture (routine x 2)     Status: None (Preliminary result)   Collection Time: 07/08/24 10:59 PM   Specimen: BLOOD  Result Value Ref Range Status   Specimen Description   Final    BLOOD BLOOD RIGHT ARM Performed at [facility], [address] [place], [place] [address]    Special Requests   Final    Blood Culture adequate volume BOTTLES DRAWN AEROBIC AND ANAEROBIC Performed at [facility], [address], [place], [place] [address]    Culture   Final    NO GROWTH 3 DAYS Performed at [facility], [address], [place], [place] [address]    Report Status PENDING  Incomplete  Body fluid culture w Gram Stain     Status: None  (Preliminary result)   Collection Time: 07/09/24  2:24 AM   Specimen: Synovium; Body Fluid  Result Value Ref Range Status   Specimen Description   Final    SYNOVIAL Performed  at [facility], [address], [place], [place] [address]    Special Requests   Final    NONE RIGHT KNEE Performed at [facility], [address], [place], [place] [address]    Gram Stain   Final    RARE WBC PRESENT, PREDOMINANTLY PMN NO ORGANISMS SEEN    Culture   Final    NO GROWTH 2 DAYS Performed at [facility], [address], [place], [place] [address]    Report Status PENDING  Incomplete  Blood culture (routine x 2)     Status: None (Preliminary result)   Collection Time: 07/09/24  1:40 PM   Specimen: BLOOD  Result Value Ref Range Status   Specimen Description   Final    BLOOD BLOOD LEFT ARM Performed at [facility], [address], [place], [place] [address]    Special Requests   Final    BOTTLES DRAWN AEROBIC AND ANAEROBIC Blood Culture adequate volume Performed at [facility], [address] [place], [place] [address]    Culture   Final    NO GROWTH 3 DAYS Performed at [facility], [address], [place], [place] [address]    Report Status PENDING  Incomplete    Radiology Studies: No results found.    Karandeep Resende T. Taura Lamarre [facility]  If 7PM-7AM, please contact night-coverage [email or web address] 07/12/2024, 12:51 PM

## 2024-07-12 NOTE — Plan of Care (Signed)

## 2024-07-13 DIAGNOSIS — M25561 Pain in right knee: Secondary | ICD-10-CM | POA: Diagnosis not present

## 2024-07-13 DIAGNOSIS — T83511A Infection and inflammatory reaction due to indwelling urethral catheter, initial encounter: Secondary | ICD-10-CM | POA: Diagnosis not present

## 2024-07-13 DIAGNOSIS — N390 Urinary tract infection, site not specified: Secondary | ICD-10-CM | POA: Diagnosis not present

## 2024-07-13 DIAGNOSIS — M25469 Effusion, unspecified knee: Secondary | ICD-10-CM | POA: Diagnosis not present

## 2024-07-13 DIAGNOSIS — B9561 Methicillin susceptible Staphylococcus aureus infection as the cause of diseases classified elsewhere: Secondary | ICD-10-CM

## 2024-07-13 DIAGNOSIS — N400 Enlarged prostate without lower urinary tract symptoms: Secondary | ICD-10-CM | POA: Diagnosis not present

## 2024-07-13 DIAGNOSIS — I10 Essential (primary) hypertension: Secondary | ICD-10-CM | POA: Diagnosis not present

## 2024-07-13 LAB — RENAL FUNCTION PANEL
Albumin: 2.9 g/dL — ABNORMAL LOW (ref 3.5–5.0)
Anion gap: 9 (ref 5–15)
BUN: 15 mg/dL (ref 8–23)
CO2: 28 mmol/L (ref 22–32)
Calcium: 8.9 mg/dL (ref 8.9–10.3)
Chloride: 99 mmol/L (ref 98–111)
Creatinine, Ser: 1.19 mg/dL (ref 0.61–1.24)
GFR, Estimated: >60 mL/min (ref >60)
Glucose, Bld: 109 mg/dL — ABNORMAL HIGH (ref 70–99)
Phosphorus: 3.1 mg/dL (ref 2.5–4.6)
Potassium: 3.8 mmol/L (ref 3.5–5.1)
Sodium: 135 mmol/L (ref 135–145)

## 2024-07-13 LAB — CBC
HCT: 36.6 % — ABNORMAL LOW (ref 39.0–52.0)
Hemoglobin: 12.4 g/dL — ABNORMAL LOW (ref 13.0–17.0)
MCH: 33.9 pg (ref 26.0–34.0)
MCHC: 33.9 g/dL (ref 30.0–36.0)
MCV: 100 fL (ref 80.0–100.0)
Platelets: 361 K/uL (ref 150–400)
RBC: 3.66 MIL/uL — ABNORMAL LOW (ref 4.22–5.81)
RDW: 11.3 % — ABNORMAL LOW (ref 11.5–15.5)
WBC: 7.6 K/uL (ref 4.0–10.5)
nRBC: 0 % (ref 0.0–0.2)

## 2024-07-13 LAB — MAGNESIUM: Magnesium: 1.5 mg/dL — ABNORMAL LOW (ref 1.7–2.4)

## 2024-07-13 MED ORDER — DORZOLAMIDE HCL-TIMOLOL MAL 2-0.5 % OP SOLN
1.0000 [drp] | Freq: Two times a day (BID) | OPHTHALMIC | Status: DC
  Administered 2024-07-13 – 2024-07-16 (×6): 1 [drp] via OPHTHALMIC
  Filled 2024-07-13: qty 10

## 2024-07-13 MED ORDER — POLYETHYLENE GLYCOL 3350 17 G PO PACK
17.0000 g | PACK | Freq: Two times a day (BID) | ORAL | Status: AC
  Administered 2024-07-13: 17 g via ORAL
  Filled 2024-07-13: qty 1

## 2024-07-13 MED ORDER — OXYCODONE HCL 5 MG PO TABS
5.0000 mg | ORAL_TABLET | Freq: Three times a day (TID) | ORAL | Status: DC | PRN
  Administered 2024-07-14 – 2024-07-16 (×3): 5 mg via ORAL
  Filled 2024-07-13 (×3): qty 1

## 2024-07-13 MED ORDER — SENNOSIDES-DOCUSATE SODIUM 8.6-50 MG PO TABS
2.0000 | ORAL_TABLET | Freq: Two times a day (BID) | ORAL | Status: AC
  Administered 2024-07-13 (×2): 2 via ORAL
  Filled 2024-07-13 (×2): qty 2

## 2024-07-13 MED ORDER — HYDROCODONE-ACETAMINOPHEN 5-325 MG PO TABS: 1.0000 | ORAL_TABLET | Freq: Three times a day (TID) | ORAL | Status: DC | PRN

## 2024-07-13 MED ORDER — ACETAMINOPHEN 325 MG PO TABS
650.0000 mg | ORAL_TABLET | Freq: Four times a day (QID) | ORAL | Status: DC
  Administered 2024-07-13 – 2024-07-16 (×7): 650 mg via ORAL
  Filled 2024-07-13 (×9): qty 2

## 2024-07-13 MED ORDER — POLYETHYLENE GLYCOL 3350 17 G PO PACK: 17.0000 g | PACK | Freq: Two times a day (BID) | ORAL | Status: DC | PRN

## 2024-07-13 MED ORDER — SENNOSIDES-DOCUSATE SODIUM 8.6-50 MG PO TABS: 2.0000 | ORAL_TABLET | Freq: Two times a day (BID) | ORAL | Status: DC | PRN

## 2024-07-13 MED ORDER — MAGNESIUM SULFATE 4 GM/100ML IV SOLN
4.0000 g | Freq: Once | INTRAVENOUS | Status: AC
  Administered 2024-07-13: 4 g via INTRAVENOUS
  Filled 2024-07-13: qty 100

## 2024-07-13 NOTE — Plan of Care (Signed)

## 2024-07-13 NOTE — TOC Progression Note (Addendum)
 Transition of Care [facility]) - Progression Note    Patient Details  Name: Ross Becker MRN: [id number] Date of Birth: [birth year]  Transition of Care [facility] CM/SW Contact  [name], RN Phone Number: 07/13/2024, 9:39 AM  Clinical Narrative:    CM called [facility] [place] [phone] left message about DME. 10:18 AM CM spoke with SW discussed additional DME recommended (Reacher, wide sock aid, long handle sponge, long hand shoe horn, dressing stick) that she will forward to Nurse to be delivered to patients home. DME request emailed to [email or web address] using HIPAA SECURE in subject .     Expected Discharge Plan: Home w Home Health Services Barriers to Discharge: Continued Medical Work up               Expected Discharge Plan and Services In-house Referral: NA Discharge Planning Services: NA Post Acute Care Choice: NA Living arrangements for the past 2 months: Apartment                 DME Arranged: N/A DME Agency: NA       HH Arranged: NA HH Agency: NA         Social Drivers of Health (SDOH) Interventions SDOH Screenings   Food Insecurity: No Food Insecurity (07/09/2024)  Housing: Low Risk  (07/09/2024)  Transportation Needs: No Transportation Needs (07/09/2024)  Utilities: Not At Risk (07/09/2024)  Social Connections: Socially Isolated (07/09/2024)  Tobacco Use: Low Risk  (07/08/2024)    Readmission Risk Interventions    07/10/2024   10:28 AM  Readmission Risk Prevention Plan  Post Dischage Appt Complete  Medication Screening Complete  Transportation Screening Complete

## 2024-07-13 NOTE — Progress Notes (Signed)
 PROGRESS NOTE  Ross Becker FMW:[id number] DOB: 06/18/1946   PCP: [facility]  Patient is from: Home.  DOA: 07/08/2024 LOS: 4  Chief complaints Chief Complaint  Patient presents with   Knee Pain    Right    Fever     Brief Narrative / Interim history: 78 year old M with PMH of CAD, HTN, BPH/urinary tension with intermittent self cath, GERD, hypothyroidism, arthritis and gout presented to ED with fever, weakness, right knee pain and swelling, and admitted with catheter associated urinary tract infection and right knee pain with effusion.  Patient had indwelling Foley catheter placed during his recent visit to ED on 11/7 due to having difficulty with straight cath.   In ED, stable vitals.  WBC 10.9.  Otherwise, CMP and CBC without significant finding.  Lactic acid negative.  UA with pyuria, bacteriuria and nitrites.  Blood culture sent.  Started on ceftriaxone and vancomycin.  Orthopedic surgery consulted.  Patient had arthrocentesis of right knee on 11/13.  Synovial fluid with 50K WBC with 96% neutrophils.  No crystals.  Blood and synovial fluid cultures NGTD.  Swelling, pain and range of motion improved after starting naproxen.  Subjective: Seen and examined earlier this morning.  No major events overnight or this morning.  Knee pain improved.  Reports constipation.  Last bowel movement about 5 days ago.   Assessment and plan: Catheter associated urinary tract infection-UA with pyuria, bacteriuria and nitrite.  Unfortunately, urine culture was not sent.  Prior urine culture on 11/7 with MSSA.  No history of MDR.  Foley catheter exchanged in ED. -Continue IV ceftriaxone.  -Change vancomycin to Zyvox given possible red man syndrome. -ID consulted  Right knee pain/effusion: No report of trauma. S/p arthrocentesis on 11/13.  Synovial fluid with 50,000 WBC with 96% neutrophils but no crystals.  Cultures NGTD.  Uric acid only 5.5.  Improved pain, effusion, swelling  and ROM after starting naproxen.  Low suspicion for infection per orthopedic surgery. -Continue ceftriaxone and Zyvox as above pending input from ID. -Continue naproxen 500 mg twice daily.   Acute urinary retention with BPH (POA) -Foley catheter exchanged on presentation.   -Flomax on board.  Essential hypertension: Soft BP - Decrease enalapril .   Hypokalemia/hypomagnesemia -Monitor replenish K and Mg as appropriate   Hypothyroidism -Continue home Synthroid .   Generalized weakness/physical deconditioning -Therapy recommending STR.  Body mass index is 22.88 kg/m.           DVT prophylaxis:  enoxaparin  (LOVENOX ) injection 40 mg Start: 07/09/24 2200  Code Status: Full code Family Communication: None at bedside Level of care: Med-Surg Status is: Inpatient Remains inpatient appropriate because: UTI and right knee pain/swelling   Final disposition: SNF   35 minutes with more than 50% spent in reviewing records, counseling patient/family and coordinating care.  Consultants:  Orthopedic surgery Infectious disease  Procedures: 11/13-right knee arthrocentesis  Microbiology summarized: 11/13-synovial fluid culture NGTD 11/13-blood cultures NGTD  Objective: Vitals:   07/12/24 1329 07/12/24 2033 07/13/24 0556 07/13/24 1342  BP: 115/68 114/65 106/61 127/80  Pulse: (!) 109 87 76 84  Resp: 16 16 15 16   Temp: (!) 97.5 F (36.4 C)  98 F (36.7 C) (!) 97.5 F (36.4 C)  TempSrc: Oral  Oral Oral  SpO2: 99% 100% 99% 100%  Weight:      Height:        Examination:  GENERAL: No apparent distress.  Nontoxic. HEENT: MMM.  Vision and hearing grossly intact.  NECK: Supple.  No  apparent JVD.  RESP:  No IWOB.  Fair aeration bilaterally. CVS:  RRR. Heart sounds normal.  ABD/GI/GU: BS+. Abd soft, NTND.  Foley catheter in place. MSK/EXT:  Moves extremities.  Improved ROM, swelling and effusion in right knee. SKIN: no apparent skin lesion or wound NEURO: AA.  Oriented  appropriately.  No apparent focal neuro deficit. PSYCH: Calm. Normal affect.   Sch Meds:  Scheduled Meds:  acetaminophen   650 mg Oral Q6H WA   Chlorhexidine  Gluconate Cloth  6 each Topical Daily   dorzolamide-timolol  1 drop Both Eyes BID   enoxaparin  (LOVENOX ) injection  40 mg Subcutaneous QHS   fluticasone  1 spray Each Nare QODAY   gabapentin  200 mg Oral QHS   levothyroxine   50 mcg Oral Q0600   linezolid  600 mg Oral Q12H   naproxen  500 mg Oral BID WC   pantoprazole   40 mg Oral Daily   polyethylene glycol  17 g Oral BID   senna-docusate  2 tablet Oral BID   tamsulosin  0.4 mg Oral Daily   Continuous Infusions:  cefTRIAXone (ROCEPHIN)  IV Stopped (07/13/24 0032)   PRN Meds:.albuterol , diphenhydrAMINE, melatonin, ondansetron  **OR** ondansetron  (ZOFRAN ) IV, oxyCODONE , polyethylene glycol **FOLLOWED BY** [START ON 07/14/2024] polyethylene glycol, senna-docusate **FOLLOWED BY** [START ON 07/14/2024] senna-docusate  Antimicrobials: Anti-infectives (From admission, onward)    Start     Dose/Rate Route Frequency Ordered Stop   07/12/24 2200  linezolid (ZYVOX) tablet 600 mg        600 mg Oral Every 12 hours 07/12/24 1244     07/10/24 0700  vancomycin (VANCOCIN) IVPB 1000 mg/200 mL premix  Status:  Discontinued       Note to Pharmacy: Administration duration and rate are locked-  unable to edit them.  Placed in Followed by Linked Group   1,000 mg 200 mL/hr over 60 Minutes Intravenous Every 12 hours 07/09/24 1759 07/12/24 0809   07/09/24 2330  cefTRIAXone (ROCEPHIN) 2 g in sodium chloride  0.9 % 100 mL IVPB        2 g 200 mL/hr over 30 Minutes Intravenous Every 24 hours 07/09/24 0805     07/09/24 1900  vancomycin (VANCOREADY) IVPB 1500 mg/300 mL       Placed in Followed by Linked Group   1,500 mg 150 mL/hr over 120 Minutes Intravenous  Once 07/09/24 1759 07/09/24 2252   07/08/24 2345  cefTRIAXone (ROCEPHIN) 2 g in sodium chloride  0.9 % 100 mL IVPB        2 g 200 mL/hr over 30  Minutes Intravenous  Once 07/08/24 2337 07/09/24 0012        I have personally reviewed the following labs and images: CBC: Recent Labs  Lab 07/08/24 2131 07/09/24 0811 07/12/24 0449 07/13/24 0410  WBC 10.9* 9.2 7.8 7.6  NEUTROABS 8.6*  --   --   --   HGB 14.8 14.4 12.4* 12.4*  HCT 43.2 41.2 35.6* 36.6*  MCV 99.1 99.3 97.8 100.0  PLT 263 261 323 361   BMP &GFR Recent Labs  Lab 07/08/24 2131 07/09/24 0811 07/10/24 0431 07/12/24 0449 07/13/24 0410  NA 135 136 134* 133* 135  K 3.5 3.3* 3.7 3.7 3.8  CL 96* 98 100 98 99  CO2 27 27 26 27 28   GLUCOSE 105* 115* 93 102* 109*  BUN 14 11 12 13 15   CREATININE 0.94 0.80 0.79 0.93 1.19  CALCIUM  9.7 9.4 8.6* 9.0 8.9  MG  --   --   --  1.5* 1.5*  PHOS  --   --   --   --  3.1   Estimated Creatinine Clearance: 54.7 mL/min (by C-G formula based on SCr of 1.19 mg/dL). Liver & Pancreas: Recent Labs  Lab 07/08/24 2131 07/13/24 0410  AST 27  --   ALT 12  --   ALKPHOS 110  --   BILITOT 1.0  --   PROT 7.6  --   ALBUMIN 3.7 2.9*   No results for input(s): LIPASE, AMYLASE in the last 168 hours. No results for input(s): AMMONIA in the last 168 hours. Diabetic: No results for input(s): HGBA1C in the last 72 hours. No results for input(s): GLUCAP in the last 168 hours. Cardiac Enzymes: No results for input(s): CKTOTAL, CKMB, CKMBINDEX, TROPONINI in the last 168 hours. No results for input(s): PROBNP in the last 8760 hours. Coagulation Profile: No results for input(s): INR, PROTIME in the last 168 hours. Thyroid  Function Tests: No results for input(s): TSH, T4TOTAL, FREET4, T3FREE, THYROIDAB in the last 72 hours. Lipid Profile: No results for input(s): CHOL, HDL, LDLCALC, TRIG, CHOLHDL, LDLDIRECT in the last 72 hours. Anemia Panel: No results for input(s): VITAMINB12, FOLATE, FERRITIN, TIBC, IRON, RETICCTPCT in the last 72 hours. Urine analysis:    Component Value  Date/Time   COLORURINE YELLOW 07/08/2024 2135   APPEARANCEUR CLOUDY (A) 07/08/2024 2135   LABSPEC 1.010 07/08/2024 2135   PHURINE 5.0 07/08/2024 2135   GLUCOSEU NEGATIVE 07/08/2024 2135   HGBUR MODERATE (A) 07/08/2024 2135   BILIRUBINUR NEGATIVE 07/08/2024 2135   KETONESUR NEGATIVE 07/08/2024 2135   PROTEINUR NEGATIVE 07/08/2024 2135   UROBILINOGEN 0.2 10/07/2014 0007   NITRITE POSITIVE (A) 07/08/2024 2135   LEUKOCYTESUR LARGE (A) 07/08/2024 2135   Sepsis Labs: Invalid input(s): PROCALCITONIN, LACTICIDVEN  Microbiology: Recent Results (from the past 240 hours)  Urine Culture     Status: Abnormal   Collection Time: 07/03/24 10:49 PM   Specimen: Urine, Random  Result Value Ref Range Status   Specimen Description   Final    URINE, RANDOM Performed at [facility], [address] [place], [place] [address]    Special Requests   Final    NONE Reflexed from [id number] Performed at [facility], [address], [place], [place] [address]    Culture >=100,000 COLONIES/mL STAPHYLOCOCCUS AUREUS (A)  Final   Report Status 07/06/2024 FINAL  Final   Organism ID, Bacteria STAPHYLOCOCCUS AUREUS (A)  Final      Susceptibility   Staphylococcus aureus - MIC*    CIPROFLOXACIN <=0.5 SENSITIVE Sensitive     GENTAMICIN <=0.5 SENSITIVE Sensitive     NITROFURANTOIN <=16 SENSITIVE Sensitive     OXACILLIN <=0.25 SENSITIVE Sensitive     TETRACYCLINE <=1 SENSITIVE Sensitive     VANCOMYCIN 1 SENSITIVE Sensitive     TRIMETH/SULFA <=10 SENSITIVE Sensitive     RIFAMPIN <=0.5 SENSITIVE Sensitive     Inducible Clindamycin  NEGATIVE Sensitive     LINEZOLID 2 SENSITIVE Sensitive     * >=100,000 COLONIES/mL STAPHYLOCOCCUS AUREUS  Blood culture (routine x 2)     Status: None (Preliminary result)   Collection Time: 07/08/24 10:59 PM   Specimen: BLOOD  Result Value Ref Range Status   Specimen Description   Final    BLOOD BLOOD RIGHT ARM Performed at [facility], [address] [place], [place] [address]    Special Requests   Final    Blood Culture adequate volume BOTTLES DRAWN AEROBIC AND  ANAEROBIC Performed at [facility], [address], [place], [place] [address]    Culture   Final    NO GROWTH 4 DAYS Performed at [facility], [address] [place], [place] [address]    Report Status PENDING  Incomplete  Body fluid culture w Gram Stain     Status: None   Collection Time: 07/09/24  2:24 AM   Specimen: Synovium; Body Fluid  Result Value Ref Range Status   Specimen Description   Final    SYNOVIAL Performed at [facility], [address], [place], [place] [address]    Special Requests   Final    NONE RIGHT KNEE Performed at [facility], [address], [place], [place] [address]    Gram Stain   Final    RARE WBC PRESENT, PREDOMINANTLY PMN NO ORGANISMS SEEN    Culture   Final    NO GROWTH 3 DAYS Performed at [facility], [address], [place], [place] [address]    Report Status 07/12/2024 FINAL  Final  Blood culture (routine x 2)     Status: None (Preliminary result)   Collection Time: 07/09/24  1:40 PM   Specimen: BLOOD  Result Value Ref Range Status   Specimen Description   Final    BLOOD BLOOD LEFT ARM Performed at [facility], [address], [place], [place] [address]    Special Requests   Final    BOTTLES DRAWN AEROBIC AND ANAEROBIC Blood Culture adequate volume Performed at [facility], [address], [place], [place] [address]    Culture   Final    NO GROWTH 4 DAYS Performed at [facility], [address], [place], [place] [address]    Report Status PENDING  Incomplete    Radiology Studies: No results found.    Rexton Greulich T. Sonnie Bias [facility]  If 7PM-7AM, please contact night-coverage [email or web address] 07/13/2024, 3:06 PM

## 2024-07-13 NOTE — Progress Notes (Signed)
 Occupational Therapy Treatment Patient Details Name: Ross Becker MRN: [id number] DOB: 01/24/1946 Today's Date: 07/13/2024   History of present illness 78 y.o. year old male presenting with fever, weakness, R knee pain/swelling. Found to have catheter associated UTI. Recently DC 07/04/24 from [facility] after presenting with dysuria, urinary frequency and fall. PMH significant for  hypertension, CAD, intermittent straight cath due to BPH, GERD, hypothyroidism, and arthritis.   OT comments  Pt making good progress towards goals. Able to progress to step pivot transfers using RW this session. Requires MOD A with elevated bed height to rise with cues for hand placement and upright posture, takes small pivotal steps towards recliner with MOD A, heavy UE reliance on RW 2/2 R knee discomfort. Grooming tasks with setup. Pt below his baseline level of functioning (typically uses RW to ambulate), and requires skilled OT to address impairments in strength, balance, mobility and activity tolerance. Discharge recommendation appropriate, patient will benefit from continued inpatient follow up therapy, <3 hours/day       If plan is discharge home, recommend the following:  A lot of help with walking and/or transfers;A lot of help with bathing/dressing/bathroom;Assistance with cooking/housework;Assist for transportation;Help with stairs or ramp for entrance   Equipment Recommendations  Tub/shower bench;Other (comment);BSC/3in1 (drop arm BSC)       Precautions / Restrictions Precautions Precautions: Fall Precaution/Restrictions Comments: R knee pain Restrictions Weight Bearing Restrictions Per Provider Order: No       Mobility Bed Mobility Overal bed mobility: Modified Independent             General bed mobility comments: increased time, HOB up, and use of rails    Transfers Overall transfer level: Needs assistance Equipment used: Rolling walker (2 wheels) Transfers: Bed to  chair/wheelchair/BSC, Sit to/from Stand Sit to Stand: Mod assist, From elevated surface     Step pivot transfers: Mod assist     General transfer comment: MOD A to stand from significantly elevated bed height, cues for weight shift and anterior trunk-hip translation to upright standing. once standing, pt able to take small shuffled steps towards R using RW, cues for hand placement. pt anxious throughout and benefits from proactive cues     Balance Overall balance assessment: History of Falls, Needs assistance Sitting-balance support: Feet supported, No upper extremity supported Sitting balance-Leahy Scale: Fair     Standing balance support: Bilateral upper extremity supported, Reliant on assistive device for balance, During functional activity Standing balance-Leahy Scale: Poor Standing balance comment: decreased tolerance due R knee pain                           ADL either performed or assessed with clinical judgement   ADL Overall ADL's : Needs assistance/impaired     Grooming: Set up;Sitting Grooming Details (indicate cue type and reason): recliner level                             Functional mobility during ADLs: Moderate assistance;Rolling walker (2 wheels);Cueing for sequencing;Cueing for safety General ADL Comments: pt requires additional time, step-by-step cues for sequencing and reassurance throughout. encourged to perform STS and step pivot to chair vs lateral scoot     Communication Communication Communication: No apparent difficulties   Cognition Arousal: Alert Behavior During Therapy: Anxious  Following commands: Intact        Cueing   Cueing Techniques: Verbal cues             Pertinent Vitals/ Pain       Pain Assessment Pain Assessment: 0-10 Pain Score: 4  Pain Location: Right knee Pain Descriptors / Indicators: Aching, Sore, Discomfort Pain Intervention(s): Limited activity  within patient's tolerance, Monitored during session, Repositioned         Frequency  Min 2X/week        Progress Toward Goals  OT Goals(current goals can now be found in the care plan section)  Progress towards OT goals: Progressing toward goals  Acute Rehab OT Goals OT Goal Formulation: With patient Time For Goal Achievement: 07/25/24 Potential to Achieve Goals: Good ADL Goals Pt Will Perform Grooming: Independently;sitting Pt Will Perform Lower Body Bathing: with modified independence;with adaptive equipment;sitting/lateral leans;sit to/from stand Pt Will Perform Lower Body Dressing: with modified independence;with adaptive equipment;sitting/lateral leans;sit to/from stand Pt Will Transfer to Toilet: with modified independence;squat pivot transfer;with transfer board;regular height toilet Pt Will Perform Toileting - Clothing Manipulation and hygiene: with modified independence;sitting/lateral leans;sit to/from stand Additional ADL Goal #1: Pt will Mod I in and OOB for basic ADLs (no rail, HOB flat, increased time)  Plan         AM-PAC OT 6 Clicks Daily Activity     Outcome Measure   Help from another person eating meals?: None Help from another person taking care of personal grooming?: A Little Help from another person toileting, which includes using toliet, bedpan, or urinal?: A Lot Help from another person bathing (including washing, rinsing, drying)?: A Lot Help from another person to put on and taking off regular upper body clothing?: A Little Help from another person to put on and taking off regular lower body clothing?: A Lot 6 Click Score: 16    End of Session Equipment Utilized During Treatment: Rolling walker (2 wheels);Gait belt  OT Visit Diagnosis: Other abnormalities of gait and mobility (R26.89);Pain Pain - Right/Left: Right Pain - part of body: Knee   Activity Tolerance Patient tolerated treatment well   Patient Left in chair;with call bell/phone  within reach;with chair alarm set   Nurse Communication Mobility status;Need for lift equipment        Time: 8995-8967 OT Time Calculation (min): 28 min  Charges: OT General Charges $OT Visit: 1 Visit OT Treatments $Self Care/Home Management : 8-22 mins $Therapeutic Activity: 8-22 mins  Ellenora Talton L. Ronna Herskowitz, OTR/L  07/13/24, 1:08 PM

## 2024-07-13 NOTE — Progress Notes (Addendum)
 Occupational Therapy Treatment Patient Details Name: Ross Becker MRN: [id number] DOB: [birth year] Today's Date: 07/13/2024   History of present illness 78 y.o. year old male presenting with fever, weakness, R knee pain/swelling. Found to have catheter associated UTI. Recently DC 07/04/24 from [facility] after presenting with dysuria, urinary frequency and fall. PMH significant for  hypertension, CAD, intermittent straight cath due to BPH, GERD, hypothyroidism, and arthritis.   OT comments  Pt making progress towards goals, seen for second session focusing on transfers to / from [facility] at request from nursing staff. Pt demonstrates improved ability to rise from recliner with MIN A, transfers recliner > BSC with MOD A, cues for hand placement and foot clearance. MAX A for pericare after BM attempt on BSC, MOD A fading to MIN A for transfer back to recliner with heavy UE reliance on RW. Additional time spent discussing discharge recommendation to STR vs home due to pt's functional decline. DME requested to be delivered to home (LB dressing AE). Pt agreeable to SNF at this time. OT will follow acutely. Patient will benefit from continued inpatient follow up therapy, <3 hours/day.       If plan is discharge home, recommend the following:  A lot of help with walking and/or transfers;A lot of help with bathing/dressing/bathroom;Assistance with cooking/housework;Assist for transportation;Help with stairs or ramp for entrance   Equipment Recommendations  Tub/shower bench;Other (comment);BSC/3in1 (drop arm BSC)       Precautions / Restrictions Precautions Precautions: Fall Precaution/Restrictions Comments: R knee pain Restrictions Weight Bearing Restrictions Per Provider Order: No       Mobility Bed Mobility Overal bed mobility: Modified Independent             General bed mobility comments: NT, pt recieved and left in recliner.    Transfers Overall transfer level: Needs  assistance Equipment used: Rolling walker (2 wheels) Transfers: Bed to chair/wheelchair/BSC, Sit to/from Stand Sit to Stand: From elevated surface, Min assist     Step pivot transfers: Mod assist, Min assist     General transfer comment: improved transfer recliner <> BSC. pt is anxious and requires reassurance throughout. MIN A with improved ability to bear weight through BLE cues for UE reliance to offset pressure.     Balance Overall balance assessment: History of Falls, Needs assistance Sitting-balance support: Feet supported, No upper extremity supported Sitting balance-Leahy Scale: Fair     Standing balance support: Bilateral upper extremity supported, Reliant on assistive device for balance, During functional activity Standing balance-Leahy Scale: Poor Standing balance comment: decreased tolerance due R knee pain                           ADL either performed or assessed with clinical judgement   ADL Overall ADL's : Needs assistance/impaired     Grooming: Set up;Sitting Grooming Details (indicate cue type and reason): recliner level                 Toilet Transfer: Moderate assistance;Minimal assistance;BSC/3in1;Rolling walker (2 wheels) Toilet Transfer Details (indicate cue type and reason): requires cues for hand placement and to scoot hips foward, pt able to stand from recliner with MIN A, transfers to [facility] with RW, MOD A and cues for technique and upright posture. pt anxious. sits on [facility] (1-Rh) for attempted unsuccessful BM, MIN A to transfer back to recliner. Toileting- Clothing Manipulation and Hygiene: Maximal assistance;Sit to/from stand Toileting - Clothing Manipulation Details (indicate cue type and reason): MAX A  for standing pericare from NT     Functional mobility during ADLs: Moderate assistance;Rolling walker (2 wheels);Cueing for sequencing;Cueing for safety;Minimal assistance General ADL Comments: NT in room to assist with Foley care and CHG.  Improved transfer ability.     Communication Communication Communication: No apparent difficulties   Cognition Arousal: Alert Behavior During Therapy: Anxious                                 Following commands: Intact        Cueing   Cueing Techniques: Verbal cues             Pertinent Vitals/ Pain       Pain Assessment Pain Assessment: Faces Pain Score: 4  Faces Pain Scale: Hurts little more Pain Location: Right knee Pain Descriptors / Indicators: Aching, Sore, Discomfort Pain Intervention(s): Limited activity within patient's tolerance, Monitored during session   Frequency  Min 2X/week        Progress Toward Goals  OT Goals(current goals can now be found in the care plan section)  Progress towards OT goals: Progressing toward goals  Acute Rehab OT Goals OT Goal Formulation: With patient Time For Goal Achievement: 07/25/24 Potential to Achieve Goals: Good ADL Goals Pt Will Perform Grooming: Independently;sitting Pt Will Perform Lower Body Bathing: with modified independence;with adaptive equipment;sitting/lateral leans;sit to/from stand Pt Will Perform Lower Body Dressing: with modified independence;with adaptive equipment;sitting/lateral leans;sit to/from stand Pt Will Transfer to Toilet: with modified independence;squat pivot transfer;with transfer board;regular height toilet Pt Will Perform Toileting - Clothing Manipulation and hygiene: with modified independence;sitting/lateral leans;sit to/from stand Additional ADL Goal #1: Pt will Mod I in and OOB for basic ADLs (no rail, HOB flat, increased time)  Plan         AM-PAC OT 6 Clicks Daily Activity     Outcome Measure   Help from another person eating meals?: None Help from another person taking care of personal grooming?: A Little Help from another person toileting, which includes using toliet, bedpan, or urinal?: A Lot Help from another person bathing (including washing, rinsing,  drying)?: A Lot Help from another person to put on and taking off regular upper body clothing?: A Little Help from another person to put on and taking off regular lower body clothing?: A Lot 6 Click Score: 16    End of Session Equipment Utilized During Treatment: Rolling walker (2 wheels);Gait belt  OT Visit Diagnosis: Other abnormalities of gait and mobility (R26.89);Pain Pain - Right/Left: Right Pain - part of body: Knee   Activity Tolerance Patient tolerated treatment well   Patient Left in chair;with call bell/phone within reach;with chair alarm set   Nurse Communication Mobility status;Need for lift equipment        Time: 1040-1100 OT Time Calculation (min): 20 min  Charges: OT General Charges $OT Visit: 1 Visit OT Treatments $Self Care/Home Management : 8-22 mins   Leslie Jester L. Faylene Allerton, OTR/L  07/13/24, 1:58 PM

## 2024-07-14 ENCOUNTER — Inpatient Hospital Stay (HOSPITAL_COMMUNITY)

## 2024-07-14 DIAGNOSIS — T83511A Infection and inflammatory reaction due to indwelling urethral catheter, initial encounter: Secondary | ICD-10-CM | POA: Diagnosis not present

## 2024-07-14 DIAGNOSIS — S72114A Nondisplaced fracture of greater trochanter of right femur, initial encounter for closed fracture: Secondary | ICD-10-CM

## 2024-07-14 DIAGNOSIS — M25561 Pain in right knee: Secondary | ICD-10-CM | POA: Diagnosis not present

## 2024-07-14 DIAGNOSIS — K219 Gastro-esophageal reflux disease without esophagitis: Secondary | ICD-10-CM | POA: Diagnosis not present

## 2024-07-14 DIAGNOSIS — E039 Hypothyroidism, unspecified: Secondary | ICD-10-CM | POA: Diagnosis not present

## 2024-07-14 LAB — RENAL FUNCTION PANEL
Albumin: 2.8 g/dL — ABNORMAL LOW (ref 3.5–5.0)
Anion gap: 10 (ref 5–15)
BUN: 17 mg/dL (ref 8–23)
CO2: 25 mmol/L (ref 22–32)
Calcium: 8.6 mg/dL — ABNORMAL LOW (ref 8.9–10.3)
Chloride: 99 mmol/L (ref 98–111)
Creatinine, Ser: 1.19 mg/dL (ref 0.61–1.24)
GFR, Estimated: >60 mL/min (ref >60)
Glucose, Bld: 79 mg/dL (ref 70–99)
Phosphorus: 2.7 mg/dL (ref 2.5–4.6)
Potassium: 4.1 mmol/L (ref 3.5–5.1)
Sodium: 134 mmol/L — ABNORMAL LOW (ref 135–145)

## 2024-07-14 LAB — ECHOCARDIOGRAM COMPLETE
Area-P 1/2: 4.54 cm2
Calc EF: 65.2 %
MV VTI: 2.59 cm2
S' Lateral: 2.5 cm
Single Plane A2C EF: 63.6 %
Single Plane A4C EF: 61.9 %

## 2024-07-14 LAB — CULTURE, BLOOD (ROUTINE X 2)
Culture: NO GROWTH
Culture: NO GROWTH
Special Requests: ADEQUATE
Special Requests: ADEQUATE

## 2024-07-14 LAB — MAGNESIUM: Magnesium: 2.3 mg/dL (ref 1.7–2.4)

## 2024-07-14 NOTE — Progress Notes (Signed)
 PROGRESS NOTE  Ross Becker FMW:[id number] DOB: 02-Feb-1946   PCP: [facility]  Patient is from: Home.  DOA: 07/08/2024 LOS: 5  Chief complaints Chief Complaint  Patient presents with   Knee Pain    Right    Fever     Brief Narrative / Interim history: 78 year old M with PMH of CAD, HTN, BPH/urinary tension with intermittent self cath, GERD, hypothyroidism, arthritis and gout presented to ED with fever, weakness, right knee pain and swelling, and admitted with catheter associated urinary tract infection and right knee pain with effusion.  Patient had indwelling Foley catheter placed during his recent visit to ED on 11/7 due to having difficulty with straight cath.   In ED, stable vitals.  WBC 10.9.  Otherwise, CMP and CBC without significant finding.  Lactic acid negative.  UA with pyuria, bacteriuria and nitrites.  Blood culture sent.  Started on ceftriaxone and vancomycin.  Orthopedic surgery consulted.  Patient had arthrocentesis of right knee on 11/13.  Synovial fluid with 50K WBC with 96% neutrophils.  No crystals.  Blood and synovial fluid cultures NGTD.  Swelling, pain and range of motion improved after starting naproxen.  ID consulted and ordered echocardiogram given history of MSSA UTI.  Unfortunately, acoustic window did not allow full evaluation for endocarditis.  Subjective: Seen and examined earlier this morning.  No major events overnight or this morning.  Knee pain and swelling improved.  No complaints.  Prefers to go to [facility] on discharge.   Assessment and plan: Catheter associated urinary tract infection-UA with pyuria, bacteriuria and nitrite.  Unfortunately, urine culture was not sent.  Prior urine culture on 11/7 with MSSA.  No history of MDR.  Foley catheter exchanged in ED. TTE as above. -Continue Zyvox and ceftriaxone. -Follow further recommendation by ID  Right knee pain/effusion: No report of trauma. S/p arthrocentesis on 11/13.   Synovial fluid with 50,000 WBC with 96% neutrophils but no crystals.  Cultures NGTD.  Uric acid only 5.5.  Improved pain, effusion, swelling and ROM after starting naproxen.  Low suspicion for infection per orthopedic surgery (discussed with Dr.  Genelle on 11/17) -Continue ceftriaxone and Zyvox  -Continue naproxen 500 mg twice daily.   Acute urinary retention with BPH (POA) -Foley catheter exchanged on presentation.   -Flomax on board.  Essential hypertension: Soft BP - Decrease enalapril .   Hypokalemia/hypomagnesemia -Monitor replenish K and Mg as appropriate   Hypothyroidism -Continue home Synthroid .   Generalized weakness/physical deconditioning -Therapy recommending STR.  Body mass index is 22.88 kg/m.           DVT prophylaxis:  enoxaparin  (LOVENOX ) injection 40 mg Start: 07/09/24 2200  Code Status: Full code Family Communication: None at bedside Level of care: Med-Surg Status is: Inpatient Remains inpatient appropriate because: UTI and right knee pain/swelling   Final disposition: SNF   35 minutes with more than 50% spent in reviewing records, counseling patient/family and coordinating care.  Consultants:  Orthopedic surgery Infectious disease  Procedures: 11/13-right knee arthrocentesis  Microbiology summarized: 11/13-synovial fluid culture NGTD 11/13-blood cultures NGTD  Objective: Vitals:   07/13/24 1342 07/13/24 2020 07/14/24 0526 07/14/24 1234  BP: 127/80 118/76 115/73 124/75  Pulse: 84 100 69 82  Resp: 16 17 15 20   Temp: (!) 97.5 F (36.4 C)  (!) 97.5 F (36.4 C) (!) 97.5 F (36.4 C)  TempSrc: Oral   Oral  SpO2: 100% 100% 98% 99%  Weight:      Height:  Examination:  GENERAL: No apparent distress.  Nontoxic. HEENT: MMM.  Vision and hearing grossly intact.  NECK: Supple.  No apparent JVD.  RESP:  No IWOB.  Fair aeration bilaterally. CVS:  RRR. Heart sounds normal.  ABD/GI/GU: BS+. Abd soft, NTND.  Foley catheter in  place. MSK/EXT:  Moves extremities.  Improved ROM, swelling and effusion in right knee. SKIN: no apparent skin lesion or wound NEURO: AA.  Oriented appropriately.  No apparent focal neuro deficit. PSYCH: Calm. Normal affect.   Sch Meds:  Scheduled Meds:  acetaminophen   650 mg Oral Q6H WA   Chlorhexidine  Gluconate Cloth  6 each Topical Daily   dorzolamide-timolol  1 drop Both Eyes BID   enoxaparin  (LOVENOX ) injection  40 mg Subcutaneous QHS   fluticasone  1 spray Each Nare QODAY   gabapentin  200 mg Oral QHS   levothyroxine   50 mcg Oral Q0600   linezolid  600 mg Oral Q12H   naproxen  500 mg Oral BID WC   pantoprazole   40 mg Oral Daily   tamsulosin  0.4 mg Oral Daily   Continuous Infusions:  cefTRIAXone (ROCEPHIN)  IV Stopped (07/14/24 0128)   PRN Meds:.albuterol , diphenhydrAMINE, melatonin, ondansetron  **OR** ondansetron  (ZOFRAN ) IV, oxyCODONE , [EXPIRED] polyethylene glycol **FOLLOWED BY** polyethylene glycol, [COMPLETED] senna-docusate **FOLLOWED BY** senna-docusate  Antimicrobials: Anti-infectives (From admission, onward)    Start     Dose/Rate Route Frequency Ordered Stop   07/12/24 2200  linezolid (ZYVOX) tablet 600 mg        600 mg Oral Every 12 hours 07/12/24 1244     07/10/24 0700  vancomycin (VANCOCIN) IVPB 1000 mg/200 mL premix  Status:  Discontinued       Note to Pharmacy: Administration duration and rate are locked-  unable to edit them.  Placed in Followed by Linked Group   1,000 mg 200 mL/hr over 60 Minutes Intravenous Every 12 hours 07/09/24 1759 07/12/24 0809   07/09/24 2330  cefTRIAXone (ROCEPHIN) 2 g in sodium chloride  0.9 % 100 mL IVPB        2 g 200 mL/hr over 30 Minutes Intravenous Every 24 hours 07/09/24 0805     07/09/24 1900  vancomycin (VANCOREADY) IVPB 1500 mg/300 mL       Placed in Followed by Linked Group   1,500 mg 150 mL/hr over 120 Minutes Intravenous  Once 07/09/24 1759 07/09/24 2252   07/08/24 2345  cefTRIAXone (ROCEPHIN) 2 g in sodium  chloride 0.9 % 100 mL IVPB        2 g 200 mL/hr over 30 Minutes Intravenous  Once 07/08/24 2337 07/09/24 0012        I have personally reviewed the following labs and images: CBC: Recent Labs  Lab 07/08/24 2131 07/09/24 0811 07/12/24 0449 07/13/24 0410  WBC 10.9* 9.2 7.8 7.6  NEUTROABS 8.6*  --   --   --   HGB 14.8 14.4 12.4* 12.4*  HCT 43.2 41.2 35.6* 36.6*  MCV 99.1 99.3 97.8 100.0  PLT 263 261 323 361   BMP &GFR Recent Labs  Lab 07/09/24 0811 07/10/24 0431 07/12/24 0449 07/13/24 0410 07/14/24 0433  NA 136 134* 133* 135 134*  K 3.3* 3.7 3.7 3.8 4.1  CL 98 100 98 99 99  CO2 27 26 27 28 25   GLUCOSE 115* 93 102* 109* 79  BUN 11 12 13 15 17   CREATININE 0.80 0.79 0.93 1.19 1.19  CALCIUM  9.4 8.6* 9.0 8.9 8.6*  MG  --   --  1.5*  1.5* 2.3  PHOS  --   --   --  3.1 2.7   Estimated Creatinine Clearance: 54.7 mL/min (by C-G formula based on SCr of 1.19 mg/dL). Liver & Pancreas: Recent Labs  Lab 07/08/24 2131 07/13/24 0410 07/14/24 0433  AST 27  --   --   ALT 12  --   --   ALKPHOS 110  --   --   BILITOT 1.0  --   --   PROT 7.6  --   --   ALBUMIN 3.7 2.9* 2.8*   No results for input(s): LIPASE, AMYLASE in the last 168 hours. No results for input(s): AMMONIA in the last 168 hours. Diabetic: No results for input(s): HGBA1C in the last 72 hours. No results for input(s): GLUCAP in the last 168 hours. Cardiac Enzymes: No results for input(s): CKTOTAL, CKMB, CKMBINDEX, TROPONINI in the last 168 hours. No results for input(s): PROBNP in the last 8760 hours. Coagulation Profile: No results for input(s): INR, PROTIME in the last 168 hours. Thyroid  Function Tests: No results for input(s): TSH, T4TOTAL, FREET4, T3FREE, THYROIDAB in the last 72 hours. Lipid Profile: No results for input(s): CHOL, HDL, LDLCALC, TRIG, CHOLHDL, LDLDIRECT in the last 72 hours. Anemia Panel: No results for input(s): VITAMINB12, FOLATE,  FERRITIN, TIBC, IRON, RETICCTPCT in the last 72 hours. Urine analysis:    Component Value Date/Time   COLORURINE YELLOW 07/08/2024 2135   APPEARANCEUR CLOUDY (A) 07/08/2024 2135   LABSPEC 1.010 07/08/2024 2135   PHURINE 5.0 07/08/2024 2135   GLUCOSEU NEGATIVE 07/08/2024 2135   HGBUR MODERATE (A) 07/08/2024 2135   BILIRUBINUR NEGATIVE 07/08/2024 2135   KETONESUR NEGATIVE 07/08/2024 2135   PROTEINUR NEGATIVE 07/08/2024 2135   UROBILINOGEN 0.2 10/07/2014 0007   NITRITE POSITIVE (A) 07/08/2024 2135   LEUKOCYTESUR LARGE (A) 07/08/2024 2135   Sepsis Labs: Invalid input(s): PROCALCITONIN, LACTICIDVEN  Microbiology: Recent Results (from the past 240 hours)  Blood culture (routine x 2)     Status: None   Collection Time: 07/08/24 10:59 PM   Specimen: BLOOD  Result Value Ref Range Status   Specimen Description   Final    BLOOD BLOOD RIGHT ARM Performed at [facility], [address], [place], [place] [address]    Special Requests   Final    Blood Culture adequate volume BOTTLES DRAWN AEROBIC AND ANAEROBIC Performed at [facility], [address] [place], [place] [address]    Culture   Final    NO GROWTH 5 DAYS Performed at [facility], [address] [place], [place] [address]    Report Status 07/14/2024 FINAL  Final  Body fluid culture w Gram Stain     Status: None   Collection Time: 07/09/24  2:24 AM   Specimen: Synovium; Body Fluid  Result Value Ref Range Status   Specimen Description   Final    SYNOVIAL Performed at [facility], [address], [place], [place] [address]    Special Requests   Final    NONE RIGHT KNEE Performed at [facility], [address], [place], [place] [address]    Gram Stain   Final    RARE WBC PRESENT, PREDOMINANTLY PMN NO ORGANISMS SEEN    Culture   Final    NO GROWTH 3 DAYS Performed at [facility], [address] [place],  [place] [address]    Report Status 07/12/2024 FINAL  Final  Blood culture (routine x 2)  Status: None   Collection Time: 07/09/24  1:40 PM   Specimen: BLOOD  Result Value Ref Range Status   Specimen Description   Final    BLOOD BLOOD LEFT ARM Performed at [facility], [address] [place], [place] [address]    Special Requests   Final    BOTTLES DRAWN AEROBIC AND ANAEROBIC Blood Culture adequate volume Performed at [facility], [address] [place], [place] [address]    Culture   Final    NO GROWTH 5 DAYS Performed at [facility], [address], [place], [place] [address]    Report Status 07/14/2024 FINAL  Final    Radiology Studies: ECHOCARDIOGRAM COMPLETE Result Date: 07/14/2024    ECHOCARDIOGRAM REPORT   Patient Name:   Ross Becker [facility] Date of Exam: 07/14/2024 Medical Rec #:  [id number]           Height:       71.0 in Accession #:    [id number]          Weight:       164.0 lb Date of Birth:  1946-04-16           BSA:          1.938 m Patient Age:    78 years            BP:           115/73 mmHg Patient Gender: M                   HR:           74 bpm. Exam Location:  Inpatient Procedure: 2D Echo (Both Spectral and Color Flow Doppler were utilized during            procedure). Indications:    Endocarditis  History:        Patient has prior history of Echocardiogram examinations.                 Signs/Symptoms:Bacteremia.  Sonographer:    Norleen Amour Referring Phys: [id number] [facility] IMPRESSIONS  1. Acoustic windows do not allow full evaluation for endocarditis. Recommend TEE if clinically indicated.  2. Left ventricular ejection fraction, by estimation, is 60 to 65%. The left ventricle has normal function. The left ventricle has no regional wall motion abnormalities. Left ventricular diastolic parameters were normal.  3. Right ventricular systolic function is normal. The right ventricular size is normal. There is normal pulmonary  artery systolic pressure.  4. The mitral valve is normal in structure. Trivial mitral valve regurgitation.  5. The aortic valve is tricuspid. Aortic valve regurgitation is not visualized. FINDINGS  Left Ventricle: Left ventricular ejection fraction, by estimation, is 60 to 65%. The left ventricle has normal function. The left ventricle has no regional wall motion abnormalities. The left ventricular internal cavity size was normal in size. There is  no left ventricular hypertrophy. Left ventricular diastolic parameters were normal. Right Ventricle: The right ventricular size is normal. Right vetricular wall thickness was not assessed. Right ventricular systolic function is normal. There is normal pulmonary artery systolic pressure. The tricuspid regurgitant velocity is 2.29 m/s, and with an assumed right atrial pressure of 8 mmHg, the estimated right ventricular systolic pressure is 29.0 mmHg. Left Atrium: Left atrial size was normal in size. Right Atrium: Right atrial size was normal in size. Pericardium: There is no evidence of pericardial effusion. Mitral Valve: The mitral valve is normal in  structure. Trivial mitral valve regurgitation. MV peak gradient, 4.8 mmHg. The mean mitral valve gradient is 2.0 mmHg. Tricuspid Valve: The tricuspid valve is normal in structure. Tricuspid valve regurgitation is mild. Aortic Valve: The aortic valve is tricuspid. Aortic valve regurgitation is not visualized. Pulmonic Valve: The pulmonic valve was normal in structure. Pulmonic valve regurgitation is not visualized. Aorta: The aortic root and ascending aorta are structurally normal, with no evidence of dilitation. IAS/Shunts: No atrial level shunt detected by color flow Doppler.  LEFT VENTRICLE PLAX 2D LVIDd:         4.20 cm     Diastology LVIDs:         2.50 cm     LV e' medial:    7.62 cm/s LV PW:         0.80 cm     LV E/e' medial:  11.2 LV IVS:        0.90 cm     LV e' lateral:   10.20 cm/s LVOT diam:     2.10 cm     LV E/e'  lateral: 8.4 LV SV:         66 LV SV Index:   34 LVOT Area:     3.46 cm LV IVRT:       66 msec  LV Volumes (MOD) LV vol d, MOD A2C: 94.9 ml LV vol d, MOD A4C: 90.5 ml LV vol s, MOD A2C: 34.5 ml LV vol s, MOD A4C: 34.5 ml LV SV MOD A2C:     60.4 ml LV SV MOD A4C:     90.5 ml LV SV MOD BP:      64.3 ml RIGHT VENTRICLE RV Basal diam:  4.50 cm     PULMONARY VEINS RV S prime:     14.00 cm/s  Diastolic Velocity: 48.80 cm/s TAPSE (M-mode): 1.9 cm      S/D Velocity:       1.20                             Systolic Velocity:  60.00 cm/s LEFT ATRIUM             Index        RIGHT ATRIUM           Index LA diam:        3.40 cm 1.75 cm/m   RA Area:     10.20 cm LA Vol (A2C):   34.5 ml 17.80 ml/m  RA Volume:   18.60 ml  9.60 ml/m LA Vol (A4C):   30.2 ml 15.58 ml/m LA Biplane Vol: 33.9 ml 17.49 ml/m  AORTIC VALVE             PULMONIC VALVE LVOT Vmax:   88.80 cm/s  PV Vmax:       0.80 m/s LVOT Vmean:  63.700 cm/s PV Peak grad:  2.6 mmHg LVOT VTI:    0.190 m  AORTA Ao Root diam: 3.30 cm Ao Asc diam:  3.20 cm MITRAL VALVE               TRICUSPID VALVE MV Area (PHT): 4.54 cm    TR Peak grad:   21.0 mmHg MV Area VTI:   2.59 cm    TR Vmax:        229.00 cm/s MV Peak grad:  4.8 mmHg MV Mean grad:  2.0 mmHg    SHUNTS MV Vmax:  1.09 m/s    Systemic VTI:  0.19 m MV Vmean:      69.1 cm/s   Systemic Diam: 2.10 cm MV Decel Time: 167 msec MV E velocity: 85.30 cm/s MV A velocity: 71.60 cm/s MV E/A ratio:  1.19 Vina Gull MD Electronically signed by Vina Gull MD Signature Date/Time: 07/14/2024/1:34:35 PM    Final       Tomika Eckles T. Hilda Rynders [facility]  If 7PM-7AM, please contact night-coverage [email or web address] 07/14/2024, 1:53 PM

## 2024-07-14 NOTE — TOC Progression Note (Signed)
 Transition of Care [facility]) - Progression Note    Patient Details  Name: Ross Becker MRN: [id number] Date of Birth: 12-20-1945  Transition of Care [facility]) CM/SW Contact  [name], RN Phone Number: 07/14/2024, 11:32 AM  Clinical Narrative:    CM spoke with patient and is now agreeable to SNF if it is [facility]. CM got a PASRR, FL2 completed, and sent referrals in HUB. Waiting on bed offer. [facility].  Expected Discharge Plan: Home w Home Health Services Barriers to Discharge: Continued Medical Work up               Expected Discharge Plan and Services In-house Referral: NA Discharge Planning Services: NA Post Acute Care Choice: NA Living arrangements for the past 2 months: Apartment                 DME Arranged: N/A DME Agency: NA       HH Arranged: NA HH Agency: NA         Social Drivers of Health (SDOH) Interventions SDOH Screenings   Food Insecurity: No Food Insecurity (07/09/2024)  Housing: Low Risk  (07/09/2024)  Transportation Needs: No Transportation Needs (07/09/2024)  Utilities: Not At Risk (07/09/2024)  Social Connections: Socially Isolated (07/09/2024)  Tobacco Use: Low Risk  (07/08/2024)    Readmission Risk Interventions    07/10/2024   10:28 AM  Readmission Risk Prevention Plan  Post Dischage Appt Complete  Medication Screening Complete  Transportation Screening Complete

## 2024-07-14 NOTE — Progress Notes (Signed)
  Echocardiogram 2D Echocardiogram has been performed.  Norleen ORN [facility] 07/14/2024, 9:01 AM

## 2024-07-14 NOTE — Progress Notes (Signed)
 Physical Therapy Treatment Patient Details Name: Ross Becker MRN: [id number] DOB: 09-Jun-1946 Today's Date: 07/14/2024   History of Present Illness 78 y.o. year old male presenting with fever, weakness, R knee pain/swelling. Found to have catheter associated UTI. Recently DC 07/04/24 from [facility] after presenting with dysuria, urinary frequency and fall. PMH significant for  hypertension, CAD, intermittent straight cath due to BPH, GERD, hypothyroidism, and arthritis.    PT Comments  PT - Cognition Comments: AxO x 3 pleasant and motivated.  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  Prior to admit, Pt was IND/driving prior to falling on his knee.   Assisted to EOB.  General bed mobility comments: self able but did require increased time to move B LE off side of bed. General transfer comment: pt was able to rise from elevated bed with increased self ability and decreased c/o R knee pain.  Still unsteady with slight hips/knees flexed and posture is poor with upper body forward flexed.  Pt reports multiple spinal fusions. Required x 2 attempts to complete.  General Gait Details: Pt was able to amb a limited distance of 6 feet requiring Mod/Max Assist + 2 and recliner following closely behind.  Posture is poor forward flexed with B hip/knee slight flextion.  Steps are short and shuffled and weigh shift is decreased on R.  HIGH FALL RISK. Positioned in recliner to comfort.  Pt reports his R knee pain is improving  but now his Left foot shows increased edema/pain.  LPT has rec Pt will need ST Rehab at SNF to address mobility and functional decline prior to safely returning home alone.    If plan is discharge home, recommend the following: Assistance with cooking/housework;A lot of help with walking and/or transfers;A lot of help with bathing/dressing/bathroom   Can travel by private vehicle     No  Equipment Recommendations       Recommendations for Other  Services       Precautions / Restrictions Precautions Precautions: Fall Precaution/Restrictions Comments: R knee pain (improving) Restrictions Weight Bearing Restrictions Per Provider Order: No Other Position/Activity Restrictions: WBAT     Mobility  Bed Mobility Overal bed mobility: Needs Assistance Bed Mobility: Supine to Sit     Supine to sit: Supervision, Contact guard     General bed mobility comments: self able but did require increased time to move B LE off side of bed.    Transfers Overall transfer level: Needs assistance Equipment used: Rolling walker (2 wheels) Transfers: Sit to/from Stand Sit to Stand: From elevated surface, Min assist, Mod assist, +2 safety/equipment           General transfer comment: pt was able to rise from elevated bed with increased self ability and decreased c/o R knee pain.  Still unsteady with slight hips/knees flexed and posture is poor with upper body forward flexed.  Pt reports multiple spinal fusions.    Ambulation/Gait Ambulation/Gait assistance: Mod assist, Max assist Gait Distance (Feet): 6 Feet Assistive device: Rolling walker (2 wheels) Gait Pattern/deviations: Step-to pattern, Decreased step length - left, Decreased step length - right, Trunk flexed Gait velocity: decreased     General Gait Details: Pt was able to amb a limited distance of 6 feet requiring Mod/Max Assist + 2 and recliner following closely behind.  Posture is poor forward flexed with B hip/knee slight flextion.  Steps are short and shuffled and weigh shift is decreased on R.  HIGH FALL RISK.  Stairs             Wheelchair Mobility     Tilt Bed    Modified Rankin (Stroke Patients Only)       Balance                                            Communication Communication Communication: No apparent difficulties  Cognition Arousal: Alert Behavior During Therapy: WFL for tasks assessed/performed   PT - Cognitive  impairments: No apparent impairments                       PT - Cognition Comments: AxO x 3 pleasant and motivated.  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 Following commands: Intact      Cueing Cueing Techniques: Verbal cues  Exercises      General Comments        Pertinent Vitals/Pain Pain Assessment Pain Assessment: Faces Pain Location: Right knee Pain Descriptors / Indicators: Aching, Discomfort Pain Intervention(s): Monitored during session    Home Living                          Prior Function            PT Goals (current goals can now be found in the care plan section) Progress towards PT goals: Progressing toward goals    Frequency    Min 2X/week      PT Plan      Co-evaluation              AM-PAC PT 6 Clicks Mobility   Outcome Measure  Help needed turning from your back to your side while in a flat bed without using bedrails?: A Little Help needed moving from lying on your back to sitting on the side of a flat bed without using bedrails?: A Little Help needed moving to and from a bed to a chair (including a wheelchair)?: A Little Help needed standing up from a chair using your arms (e.g., wheelchair or bedside chair)?: A Little Help needed to walk in hospital room?: A Lot Help needed climbing 3-5 steps with a railing? : Total 6 Click Score: 15    End of Session Equipment Utilized During Treatment: Gait belt Activity Tolerance: Patient limited by fatigue Patient left: in chair;with chair alarm set;with call bell/phone within reach Nurse Communication: Mobility status PT Visit Diagnosis: History of falling (Z91.81);Other abnormalities of gait and mobility (R26.89);Muscle weakness (generalized) (M62.81)     Time: 8881-8855 PT Time Calculation (min) (ACUTE ONLY): 26 min  Charges:    $Gait Training: 8-22 mins $Therapeutic Activity: 8-22 mins PT General Charges $$ ACUTE PT VISIT: 1 Visit                     Katheryn Leap  PTA Acute  Rehabilitation Services Office M-F          [phone]

## 2024-07-14 NOTE — NC FL2 (Signed)
 [place]  MEDICAID [id number] LEVEL OF CARE FORM     IDENTIFICATION  Patient Name: Ross Becker Birthdate: [birth year] Sex: male Admission Date (Current Location): 07/08/2024  [facility] and [place] Number:  Producer, Television/film/video and Address:  [facility],  [address] [place] [address]      Provider Number: [id number]  Attending Physician Name and Address:  Kathrin Mignon DASEN, MD  Relative Name and Phone Number:       Current Level of Care: Hospital Recommended Level of Care: Skilled Nursing Facility Prior Approval Number:    Date Approved/Denied:   PASRR Number: [id number]  Discharge Plan: SNF    Current Diagnoses: Patient Active Problem List   Diagnosis Date Noted   Closed nondisplaced fracture of greater trochanter of right femur (HCC) 07/14/2024   Catheter-associated urinary tract infection 07/09/2024   BPH (benign prostatic hyperplasia) 07/09/2024   Right knee pain 07/09/2024   Hypokalemia 07/09/2024   Hypothyroidism 07/09/2024   GERD (gastroesophageal reflux disease) 07/09/2024   UTI (urinary tract infection) 07/09/2024   S/P arthroscopy of left knee 03/18/2018   Asbestos-induced pleural plaque 03/08/2018   Other meniscus derangements, posterior horn of medial meniscus, left knee    Synovitis of knee    Unilateral primary osteoarthritis, left knee 11/25/2017   Complex tear of medial meniscus of left knee 10/01/2017   Chest pain 12/09/2016   Unstable angina (HCC)    Weakness of right foot 10/07/2014   Right hemiplegia (HCC) 10/06/2014   TIA (transient ischemic attack) 10/06/2014   Essential hypertension 10/06/2014    Orientation RESPIRATION BLADDER Height & Weight     Self, Time, Situation  Normal Indwelling catheter Weight: 74.4 kg Height:  5' 11 (180.3 cm)  BEHAVIORAL SYMPTOMS/MOOD NEUROLOGICAL BOWEL NUTRITION STATUS      Continent Diet  AMBULATORY STATUS COMMUNICATION OF NEEDS Skin   Extensive Assist Verbally Normal                        Personal Care Assistance Level of Assistance  Bathing, Feeding, Dressing Bathing Assistance: Limited assistance Feeding assistance: Independent Dressing Assistance: Limited assistance     Functional Limitations Info             SPECIAL CARE FACTORS FREQUENCY  PT (By licensed PT), OT (By licensed OT)     PT Frequency: 5x weekly OT Frequency: 5x weekly            Contractures Contractures Info: Not present    Additional Factors Info  Code Status, Allergies Code Status Info: FULL Allergies Info: Mushroom Extract Complex (Obsolete), Penicillins           Current Medications (07/14/2024):  This is the current hospital active medication list Current Facility-Administered Medications  Medication Dose Route Frequency Provider Last Rate Last Admin   acetaminophen  (TYLENOL ) tablet 650 mg  650 mg Oral Q6H WA Gonfa, Taye T, MD   650 mg at 07/14/24 0950   albuterol  (PROVENTIL ) (2.5 MG/3ML) 0.083% nebulizer solution 3 mL  3 mL Nebulization Q6H PRN Foust, Katy L, NP       cefTRIAXone (ROCEPHIN) 2 g in sodium chloride  0.9 % 100 mL IVPB  2 g Intravenous Q24H Foust, Katy L, NP   Stopped at 07/14/24 0128   Chlorhexidine  Gluconate Cloth 2 % PADS 6 each  6 each Topical Daily Foust, Katy L, NP   6 each at 07/14/24 0951   diphenhydrAMINE (BENADRYL) injection 25 mg  25 mg Intravenous Q6H  PRN Dibia, Pauline E, MD   25 mg at 07/10/24 1326   dorzolamide-timolol (COSOPT) 2-0.5 % ophthalmic solution 1 drop  1 drop Both Eyes BID Gonfa, Taye T, MD   1 drop at 07/14/24 0950   enoxaparin  (LOVENOX ) injection 40 mg  40 mg Subcutaneous QHS Foust, Katy L, NP   40 mg at 07/13/24 2055   fluticasone (FLONASE) 50 MCG/ACT nasal spray 1 spray  1 spray Each Nare QODAY Foust, Katy L, NP   1 spray at 07/09/24 1039   gabapentin (NEURONTIN) capsule 200 mg  200 mg Oral QHS Foust, Katy L, NP   200 mg at 07/13/24 2054   levothyroxine  (SYNTHROID ) tablet 50 mcg  50 mcg Oral Q0600 Foust, Rockie L, NP   50 mcg at  07/14/24 0604   linezolid (ZYVOX) tablet 600 mg  600 mg Oral Q12H Gonfa, Taye T, MD   600 mg at 07/14/24 0950   melatonin tablet 5 mg  5 mg Oral QHS PRN Foust, Katy L, NP       naproxen (NAPROSYN) tablet 500 mg  500 mg Oral BID WC Gonfa, Taye T, MD   500 mg at 07/14/24 0950   ondansetron  (ZOFRAN ) tablet 4 mg  4 mg Oral Q6H PRN Foust, Katy L, NP       Or   ondansetron  (ZOFRAN ) injection 4 mg  4 mg Intravenous Q6H PRN Foust, Katy L, NP       oxyCODONE  (Oxy IR/ROXICODONE ) immediate release tablet 5 mg  5 mg Oral Q8H PRN Gonfa, Taye T, MD   5 mg at 07/14/24 0100   pantoprazole  (PROTONIX ) EC tablet 40 mg  40 mg Oral Daily Foust, Katy L, NP   40 mg at 07/14/24 0950   polyethylene glycol (MIRALAX / GLYCOLAX) packet 17 g  17 g Oral BID PRN Gonfa, Taye T, MD       senna-docusate (Senokot-S) tablet 2 tablet  2 tablet Oral BID PRN Gonfa, Taye T, MD       tamsulosin (FLOMAX) capsule 0.4 mg  0.4 mg Oral Daily Foust, Katy L, NP   0.4 mg at 07/14/24 9049     Discharge Medications: Please see discharge summary for a list of discharge medications.  Relevant Imaging Results:  Relevant Lab Results:   Additional Information SSN   [id number]  [name], RN

## 2024-07-14 NOTE — Plan of Care (Signed)

## 2024-07-14 NOTE — Plan of Care (Signed)

## 2024-07-14 NOTE — H&P (Deleted)
 ORTHOPAEDIC CONSULTATION  REQUESTING PHYSICIAN: Kathrin Mignon DASEN, MD  Chief Complaint: Right hip pain  HPI: Ross Becker is a 78 y.o. male who presents with presents with a right greater trochanteric hip fracture after a fall from standing.  He was admitted to the hospital with a concern for heart block.  He is currently in the cardiac ICU.  He does endorse lateral based hip pain although is been able to mobilize.  He is a baseline tourist information centre manager  Past Medical History:  Diagnosis Date   Arthritis    Balance problems    Cervicalgia    Diverticulitis    Enlarged prostate    GERD (gastroesophageal reflux disease)    Gout    Hypertension    Hypothyroidism    Liver lesion    Past Surgical History:  Procedure Laterality Date   CHOLECYSTECTOMY     HERNIA REPAIR     KNEE ARTHROSCOPY WITH MEDIAL MENISECTOMY Left 02/26/2018   Procedure: LEFT KNEE ARTHROSCOPY WITH PARTIAL MEDIAL MENISCECTOMY, SYNOVECTOMY;  Surgeon: Jerri Kay HERO, MD;  Location: [facility];  Service: Orthopedics;  Laterality: Left;   Social History   Socioeconomic History   Marital status: Single    Spouse name: Not on file   Number of children: Not on file   Years of education: Not on file   Highest education level: Not on file  Occupational History   Not on file  Tobacco Use   Smoking status: Never   Smokeless tobacco: Never  Vaping Use   Vaping status: Never Used  Substance and Sexual Activity   Alcohol use: Yes    Comment: occasionally   Drug use: No   Sexual activity: Not on file  Other Topics Concern   Not on file  Social History Narrative   Not on file   Social Drivers of Health   Financial Resource Strain: Not on file  Food Insecurity: No Food Insecurity (07/09/2024)   Hunger Vital Sign    Worried About Running Out of Food in the Last Year: Never true    Ran Out of Food in the Last Year: Never true  Transportation Needs: No Transportation Needs (07/09/2024)   PRAPARE  - Administrator, Civil Service (Medical): No    Lack of Transportation (Non-Medical): No  Physical Activity: Not on file  Stress: Not on file  Social Connections: Socially Isolated (07/09/2024)   Social Connection and Isolation Panel    Frequency of Communication with Friends and Family: Once a week    Frequency of Social Gatherings with Friends and Family: Once a week    Attends Religious Services: Never    Database Administrator or Organizations: No    Attends Engineer, Structural: 1 to 4 times per year    Marital Status: Divorced   Family History  Problem Relation Age of Onset   Brain cancer Mother    Cancer Father    - negative except otherwise stated in the family history section Allergies  Allergen Reactions   Mushroom Extract Complex (Obsolete)     Avoids due to gout   Penicillins Other (See Comments)    childhood allergy   Prior to Admission medications   Medication Sig Start Date End Date Taking? Authorizing Provider  allopurinol (ZYLOPRIM) 300 MG tablet Take 300 mg by mouth daily. 04/29/24  Yes [provider]  cholecalciferol (VITAMIN D) 1000 UNITS tablet Take 2,000 Units by mouth daily.    Yes  [provider]  colchicine  0.6 MG tablet TAKE TWO TABLETS BY MOUTH ONCE AT ONSET -  MAY TAKE ONE TABLET ONE HOUR LATER IF NEEDED (MAXIMUM OF 3 TABLETS IN 3 DAYS) 04/29/24  Yes [provider]  cyanocobalamin 1000 MCG tablet Take 1,000 mcg by mouth in the morning and at bedtime.   Yes [provider]  dorzolamide-timolol (COSOPT) 2-0.5 % ophthalmic solution Place 1 drop into both eyes 2 (two) times daily.   Yes [provider]  enalapril  (VASOTEC ) 20 MG tablet Take 40 mg by mouth daily.   Yes [provider]  fluticasone (FLONASE) 50 MCG/ACT nasal spray Place 1 spray into both nostrils every other day.   Yes [provider]  gabapentin (NEURONTIN) 100 MG capsule Take 200 mg by mouth at bedtime.  08/20/23  Yes [provider]  HYDROcodone -acetaminophen  (NORCO/VICODIN) 5-325 MG tablet Take 1 tablet by mouth every 6 (six) hours as needed for moderate pain (pain score 4-6). 04/08/24  Yes Zackowski, Scott, MD  levothyroxine  (SYNTHROID , LEVOTHROID) 50 MCG tablet Take 50 mcg by mouth daily before breakfast.   Yes [provider]  omeprazole (PRILOSEC) 20 MG capsule Take 40 mg by mouth daily.    Yes [provider]  ondansetron  (ZOFRAN  ODT) 4 MG disintegrating tablet Take 1 tablet (4 mg total) by mouth every 8 (eight) hours as needed for up to 15 doses for nausea or vomiting. 05/01/21  Yes Cottie Donnice PARAS, MD  pantoprazole  (PROTONIX ) 40 MG tablet Take 40 mg by mouth daily. 01/30/24  Yes [provider]  simethicone (MYLICON) 80 MG chewable tablet Chew 80 mg by mouth as needed for flatulence. 07/30/13  Yes [provider]  tamsulosin (FLOMAX) 0.4 MG CAPS capsule Take 0.4 mg by mouth daily.   Yes [provider]  albuterol  (VENTOLIN  HFA) 108 (90 Base) MCG/ACT inhaler Inhale 1-2 puffs into the lungs every 6 (six) hours as needed for wheezing or shortness of breath. Patient not taking: Reported on 07/09/2024 07/22/19   Long, Joshua G, MD  indomethacin (INDOCIN) 50 MG capsule Take 50 mg by mouth 3 (three) times daily with meals. 04/29/24   [provider]  lidocaine  (LIDODERM ) 5 % Place 1 patch onto the skin daily. Remove & Discard patch within 12 hours or as directed by MD 07/03/24   Hoy Nidia FALCON, PA-C  loperamide (IMODIUM) 2 MG capsule Take 4 mg by mouth as needed for diarrhea or loose stools. Patient not taking: Reported on 07/09/2024    [provider]  oxyCODONE -acetaminophen  (PERCOCET/ROXICET) 5-325 MG tablet Take 1 tablet by mouth every 6 (six) hours as needed for severe pain. Patient not taking: Reported on 07/09/2024 10/20/22   Zelaya, Oscar A, PA-C  predniSONE  (DELTASONE ) 10 MG tablet Take 5 tablets (50 mg total) by mouth  daily. Patient not taking: Reported on 07/09/2024 04/18/23   Charlyn Sora, MD  predniSONE  (DELTASONE ) 10 MG tablet Take 4 tablets (40 mg total) by mouth daily. Patient not taking: Reported on 07/09/2024 04/08/24   Zackowski, Scott, MD  terazosin  (HYTRIN ) 2 MG capsule Take 8 mg by mouth at bedtime.    [provider]   No results found.   Positive ROS: All other systems have been reviewed and were otherwise negative with the exception of those mentioned in the HPI and as above.  Physical Exam: General: No acute distress Cardiovascular: No pedal edema Respiratory: No cyanosis, no use of accessory musculature GI: No organomegaly, abdomen is soft and non-tender  Skin: No lesions in the area of chief complaint Neurologic: Sensation intact distally Psychiatric: Patient is at baseline mood and affect Lymphatic: No axillary or cervical lymphadenopathy  MUSCULOSKELETAL:  Right hip with tenderness about the trochanter.  Minimal pain with gentle logroll.  Limb lengths are equal dyspnea sensory is intact  Independent Imaging Review: 3 views right hip, CT right hip: Minimal displacement of greater trochanter fracture without evidence of extension to the intertrochanteric region  Assessment: 78 year old male with isolated right greater trochanteric hip fracture after a fall directly on the side.  At this time he may continue to weight-bear as tolerated without precaution.  I will plan to see him back in the office upon discharge  Plan: May follow-up with me on discharge, okay for weightbearing as tolerated at this time without precautions  Thank you for the consult and the opportunity to see Ross Becker Parker, MD [facility] 9:06 AM

## 2024-07-15 DIAGNOSIS — M25561 Pain in right knee: Secondary | ICD-10-CM | POA: Diagnosis not present

## 2024-07-15 DIAGNOSIS — E039 Hypothyroidism, unspecified: Secondary | ICD-10-CM | POA: Diagnosis not present

## 2024-07-15 DIAGNOSIS — K219 Gastro-esophageal reflux disease without esophagitis: Secondary | ICD-10-CM | POA: Diagnosis not present

## 2024-07-15 DIAGNOSIS — T83511A Infection and inflammatory reaction due to indwelling urethral catheter, initial encounter: Secondary | ICD-10-CM | POA: Diagnosis not present

## 2024-07-15 LAB — RENAL FUNCTION PANEL
Albumin: 2.9 g/dL — ABNORMAL LOW (ref 3.5–5.0)
Anion gap: 8 (ref 5–15)
BUN: 13 mg/dL (ref 8–23)
CO2: 26 mmol/L (ref 22–32)
Calcium: 9 mg/dL (ref 8.9–10.3)
Chloride: 103 mmol/L (ref 98–111)
Creatinine, Ser: 1.01 mg/dL (ref 0.61–1.24)
GFR, Estimated: >60 mL/min (ref >60)
Glucose, Bld: 81 mg/dL (ref 70–99)
Phosphorus: 3 mg/dL (ref 2.5–4.6)
Potassium: 4.4 mmol/L (ref 3.5–5.1)
Sodium: 137 mmol/L (ref 135–145)

## 2024-07-15 LAB — CBC
HCT: 36.8 % — ABNORMAL LOW (ref 39.0–52.0)
Hemoglobin: 12.4 g/dL — ABNORMAL LOW (ref 13.0–17.0)
MCH: 33.9 pg (ref 26.0–34.0)
MCHC: 33.7 g/dL (ref 30.0–36.0)
MCV: 100.5 fL — ABNORMAL HIGH (ref 80.0–100.0)
Platelets: 392 K/uL (ref 150–400)
RBC: 3.66 MIL/uL — ABNORMAL LOW (ref 4.22–5.81)
RDW: 11.4 % — ABNORMAL LOW (ref 11.5–15.5)
WBC: 7.2 K/uL (ref 4.0–10.5)
nRBC: 0 % (ref 0.0–0.2)

## 2024-07-15 LAB — MAGNESIUM: Magnesium: 2 mg/dL (ref 1.7–2.4)

## 2024-07-15 MED ORDER — COLCHICINE 0.6 MG PO TABS
0.6000 mg | ORAL_TABLET | Freq: Every day | ORAL | Status: DC
  Administered 2024-07-15 – 2024-07-16 (×2): 0.6 mg via ORAL
  Filled 2024-07-15 (×2): qty 1

## 2024-07-15 MED ORDER — CEFADROXIL 500 MG PO CAPS
1000.0000 mg | ORAL_CAPSULE | Freq: Two times a day (BID) | ORAL | Status: DC
  Administered 2024-07-15 – 2024-07-16 (×3): 1000 mg via ORAL
  Filled 2024-07-15 (×3): qty 2

## 2024-07-15 MED ORDER — SENNOSIDES-DOCUSATE SODIUM 8.6-50 MG PO TABS: 1.0000 | ORAL_TABLET | Freq: Two times a day (BID) | ORAL | Status: AC | PRN

## 2024-07-15 MED ORDER — INDOMETHACIN 25 MG PO CAPS
50.0000 mg | ORAL_CAPSULE | Freq: Three times a day (TID) | ORAL | Status: DC
  Administered 2024-07-15 – 2024-07-16 (×3): 50 mg via ORAL
  Filled 2024-07-15 (×5): qty 2

## 2024-07-15 MED ORDER — ALLOPURINOL 300 MG PO TABS
300.0000 mg | ORAL_TABLET | Freq: Every day | ORAL | Status: DC
  Administered 2024-07-15 – 2024-07-16 (×2): 300 mg via ORAL
  Filled 2024-07-15 (×2): qty 1

## 2024-07-15 MED ORDER — POLYETHYLENE GLYCOL 3350 17 GM/SCOOP PO POWD: 17.0000 g | Freq: Two times a day (BID) | ORAL | Status: AC | PRN

## 2024-07-15 MED ORDER — CEFADROXIL 500 MG PO CAPS: 1000.0000 mg | ORAL_CAPSULE | Freq: Two times a day (BID) | ORAL | Status: AC

## 2024-07-15 MED ORDER — LINEZOLID 600 MG PO TABS: 600.0000 mg | ORAL_TABLET | Freq: Two times a day (BID) | ORAL | Status: AC

## 2024-07-15 MED ORDER — ACETAMINOPHEN 325 MG PO TABS: 650.0000 mg | ORAL_TABLET | Freq: Four times a day (QID) | ORAL | Status: AC

## 2024-07-15 NOTE — Plan of Care (Signed)

## 2024-07-15 NOTE — Plan of Care (Signed)
  Problem: Education: Goal: Knowledge of General Education information will improve Description: Including pain rating scale, medication(s)/side effects and non-pharmacologic comfort measures 07/15/2024 1437 by Alaina Dozier PARAS, RN Outcome: Adequate for Discharge 07/15/2024 1014 by Alaina Dozier PARAS, RN Outcome: Progressing   Problem: Health Behavior/Discharge Planning: Goal: Ability to manage health-related needs will improve 07/15/2024 1437 by Alaina Dozier PARAS, RN Outcome: Adequate for Discharge 07/15/2024 1014 by Alaina Dozier PARAS, RN Outcome: Progressing   Problem: Clinical Measurements: Goal: Ability to maintain clinical measurements within normal limits will improve 07/15/2024 1437 by Alaina Dozier PARAS, RN Outcome: Adequate for Discharge 07/15/2024 1014 by Alaina Dozier PARAS, RN Outcome: Progressing Goal: Will remain free from infection 07/15/2024 1437 by Alaina Dozier PARAS, RN Outcome: Adequate for Discharge 07/15/2024 1014 by Alaina Dozier PARAS, RN Outcome: Progressing Goal: Diagnostic test results will improve Outcome: Adequate for Discharge Goal: Respiratory complications will improve Outcome: Adequate for Discharge Goal: Cardiovascular complication will be avoided Outcome: Adequate for Discharge   Problem: Activity: Goal: Risk for activity intolerance will decrease Outcome: Adequate for Discharge   Problem: Nutrition: Goal: Adequate nutrition will be maintained Outcome: Adequate for Discharge   Problem: Coping: Goal: Level of anxiety will decrease Outcome: Adequate for Discharge   Problem: Elimination: Goal: Will not experience complications related to bowel motility Outcome: Adequate for Discharge Goal: Will not experience complications related to urinary retention Outcome: Adequate for Discharge   Problem: Pain Managment: Goal: General experience of comfort will improve and/or be controlled Outcome: Adequate for  Discharge   Problem: Safety: Goal: Ability to remain free from injury will improve Outcome: Adequate for Discharge   Problem: Skin Integrity: Goal: Risk for impaired skin integrity will decrease Outcome: Adequate for Discharge

## 2024-07-15 NOTE — Progress Notes (Signed)
 Physical Therapy Treatment Patient Details Name: Ross Becker MRN: [id number] DOB: [birth year] Today's Date: 07/15/2024   History of Present Illness 78 y.o. year old male presenting with fever, weakness, R knee pain/swelling. Found to have catheter associated UTI. Recently DC 07/04/24 from [facility] after presenting with dysuria, urinary frequency and fall. PMH significant for  hypertension, CAD, intermittent straight cath due to BPH, GERD, hypothyroidism, and arthritis.    PT Comments  AxO x 3 pleasant and motivated.  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  Prior to admit, Pt was IND/driving prior to falling on his knee.   Assisted OOB to amb to the bathroom was difficult.  General transfer comment: pt was able to rise from elevated bed with increased self ability and decreased c/o R knee pain.  Still unsteady with slight hips/knees flexed and posture is poor with upper body forward flexed.  Pt reports multiple spinal fusions.  Also assisted with a toilet transfer requiring increased assist to rise from lower level.  Assisted with balance during self peri care.  Limited static standing at sink to wash hands fue to increased c/o fatigue with increased activity. General Gait Details: Pt was able to amb a limited distance of 8 feet requiring Mod/Max Assist to the bathroom.[name]  Posture is poor forward flexed with B hip/knee slight flextion.  Steps are short and shuffled and weigh shift is decreased on R.  HIGH FALL RISK.  LPT has rec Pt will need ST Rehab at SNF to address mobility and functional decline prior to safely returning home alone.    If plan is discharge home, recommend the following: Assistance with cooking/housework;A lot of help with walking and/or transfers;A lot of help with bathing/dressing/bathroom   Can travel by private vehicle     No  Equipment Recommendations       Recommendations for Other Services       Precautions / Restrictions  Precautions Precautions: Fall Precaution/Restrictions Comments: R knee pain (improving) Restrictions Weight Bearing Restrictions Per Provider Order: No Other Position/Activity Restrictions: WBAT     Mobility  Bed Mobility Overal bed mobility: Needs Assistance Bed Mobility: Supine to Sit     Supine to sit: Supervision, Contact guard     General bed mobility comments: self able but did require increased time to move B LE off side of bed.    Transfers Overall transfer level: Needs assistance Equipment used: Rolling walker (2 wheels) Transfers: Sit to/from Stand Sit to Stand: From elevated surface, Min assist, Mod assist, +2 safety/equipment   Step pivot transfers: Mod assist, Min assist       General transfer comment: pt was able to rise from elevated bed with increased self ability and decreased c/o R knee pain.  Still unsteady with slight hips/knees flexed and posture is poor with upper body forward flexed.  Pt reports multiple spinal fusions.  Also assisted with a toilet transfer requiring increased assist to rise from lower level.  Assisted with balance during self peri care.  Limited static standing at sink to wash hands fue to increased c/o fatigue with increased activity.    Ambulation/Gait Ambulation/Gait assistance: Mod assist, Max assist Gait Distance (Feet): 16 Feet (8 feet x 2) Assistive device: Rolling walker (2 wheels) Gait Pattern/deviations: Step-to pattern, Decreased step length - left, Decreased step length - right, Trunk flexed Gait velocity: decreased     General Gait Details: Pt was able to amb a limited distance of 8 feet requiring Mod/Max  Assist to the bathroom.[name]  Posture is poor forward flexed with B hip/knee slight flextion.  Steps are short and shuffled and weigh shift is decreased on R.  HIGH FALL RISK.   Stairs             Wheelchair Mobility     Tilt Bed    Modified Rankin (Stroke Patients Only)       Balance                                             Communication Communication Communication: No apparent difficulties  Cognition Arousal: Alert Behavior During Therapy: WFL for tasks assessed/performed   PT - Cognitive impairments: No apparent impairments                       PT - Cognition Comments: AxO x 3 pleasant and motivated.  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 Following commands: Intact      Cueing Cueing Techniques: Verbal cues  Exercises      General Comments        Pertinent Vitals/Pain Pain Assessment Pain Assessment: Faces Faces Pain Scale: Hurts a little bit Pain Location: Right knee (much improved) Pain Descriptors / Indicators: Aching, Discomfort Pain Intervention(s): Monitored during session    Home Living                          Prior Function            PT Goals (current goals can now be found in the care plan section) Progress towards PT goals: Progressing toward goals    Frequency    Min 2X/week      PT Plan      Co-evaluation              AM-PAC PT 6 Clicks Mobility   Outcome Measure  Help needed turning from your back to your side while in a flat bed without using bedrails?: A Little Help needed moving from lying on your back to sitting on the side of a flat bed without using bedrails?: A Little Help needed moving to and from a bed to a chair (including a wheelchair)?: A Little Help needed standing up from a chair using your arms (e.g., wheelchair or bedside chair)?: A Lot Help needed to walk in hospital room?: A Lot Help needed climbing 3-5 steps with a railing? : A Lot 6 Click Score: 15    End of Session Equipment Utilized During Treatment: Gait belt Activity Tolerance: Patient limited by fatigue Patient left: in chair;with chair alarm set;with call bell/phone within reach Nurse Communication: Mobility status PT Visit Diagnosis: History of falling (Z91.81);Other  abnormalities of gait and mobility (R26.89);Muscle weakness (generalized) (M62.81)     Time: 9073-9041 PT Time Calculation (min) (ACUTE ONLY): 32 min  Charges:    $Gait Training: 8-22 mins $Therapeutic Activity: 8-22 mins PT General Charges $$ ACUTE PT VISIT: 1 Visit                     Katheryn Leap  PTA Acute  Rehabilitation Services Office M-F          [phone]

## 2024-07-15 NOTE — Progress Notes (Signed)
 PROGRESS NOTE  Ross Becker FMW:[id number] DOB: 19-Nov-1945   PCP: [facility]  Patient is from: Home.  DOA: 07/08/2024 LOS: 6  Chief complaints Chief Complaint  Patient presents with   Knee Pain    Right    Fever     Brief Narrative / Interim history: 78 year old M with PMH of CAD, HTN, BPH/urinary tension with intermittent self cath, GERD, hypothyroidism, arthritis and gout presented to ED with fever, weakness, right knee pain and swelling, and admitted with catheter associated urinary tract infection and right knee pain with effusion.  Patient had indwelling Foley catheter placed during his recent visit to ED on 11/7 due to having difficulty with straight cath.   In ED, stable vitals.  WBC 10.9.  Otherwise, CMP and CBC without significant finding.  Lactic acid negative.  UA with pyuria, bacteriuria and nitrites.  Blood culture sent.  Started on ceftriaxone and vancomycin.  Orthopedic surgery consulted.  Patient had arthrocentesis of right knee on 11/13.  Synovial fluid with 50K WBC with 96% neutrophils.  No crystals.  Blood and synovial fluid cultures NGTD.  Swelling, pain and range of motion improved after starting naproxen.  ID consulted and ordered echocardiogram given history of MSSA UTI.  TTE without significant finding but acoustic window did not allow full evaluation for endocarditis.  Disseminated MSSA infection felt to be less likely.  ID recommended completing 10 days of antibiotics with cefadroxil and Zyvox.  Therapy recommended SNF.  Medically stable for discharge.  Subjective: Seen and examined earlier this morning.  No major events overnight or this morning.  Knee pain and swelling improved.  No complaints.  Prefers to go to [facility] on discharge.   Assessment and plan: Catheter associated urinary tract infection-UA with pyuria, bacteriuria and nitrite.  Unfortunately, urine culture was not sent.  Prior urine culture on 11/7 with MSSA.  No history  of MDR.  Foley catheter exchanged in ED. TTE as above.  Disseminated MSSA infection felt to be unlikely. -Continue Zyvox and ceftriaxone and complete total of 10 days course with p.o. Zyvox and cefadroxil until 11/27. -ID signed off.  Right knee pain/effusion: No report of trauma. S/p arthrocentesis on 11/13.  Synovial fluid with 50,000 WBC with 96% neutrophils but no crystals.  Cultures NGTD.  Uric acid only 5.5.  Improved pain, effusion, swelling and ROM after starting naproxen.  Low suspicion for infection per orthopedic surgery (discussed with Dr.  Genelle on 11/17) -Antibiotics as above. -Discontinue naproxen and start home indomethacin -Continue home colchicine  and allopurinol   Acute urinary retention with BPH (POA) -Foley catheter exchanged on presentation.   -Flomax on board. - Outpatient follow-up with urology.  Essential hypertension: Normotensive.  Enalapril  discontinued due to hypotension.   Hypokalemia/hypomagnesemia -Monitor replenish K and Mg as appropriate   Hypothyroidism -Continue home Synthroid .   Generalized weakness/physical deconditioning -Therapy recommending STR.  Body mass index is 22.88 kg/m.           DVT prophylaxis:  enoxaparin  (LOVENOX ) injection 40 mg Start: 07/09/24 2200  Code Status: Full code Family Communication: None at bedside Level of care: Med-Surg Status is: Inpatient Remains inpatient appropriate because: SNF bed.   Final disposition: SNF   35 minutes with more than 50% spent in reviewing records, counseling patient/family and coordinating care.  Consultants:  Orthopedic surgery Infectious disease  Procedures: 11/13-right knee arthrocentesis  Microbiology summarized: 11/13-synovial fluid culture NGTD 11/13-blood cultures NGTD  Objective: Vitals:   07/14/24 0526 07/14/24 1234 07/14/24 1940 07/15/24 0501  BP: 115/73 124/75 128/86 110/70  Pulse: 69 82 86 79  Resp: 15 20 16 16   Temp: (!) 97.5 F (36.4 C) (!) 97.5  F (36.4 C) (!) 97.5 F (36.4 C) 98.4 F (36.9 C)  TempSrc:  Oral Oral Oral  SpO2: 98% 99% 100% 100%  Weight:      Height:        Examination:  GENERAL: No apparent distress.  Nontoxic. HEENT: MMM.  Vision and hearing grossly intact.  NECK: Supple.  No apparent JVD.  RESP:  No IWOB.  Fair aeration bilaterally. CVS:  RRR. Heart sounds normal.  ABD/GI/GU: BS+. Abd soft, NTND.  Foley catheter in place. MSK/EXT:  Moves extremities.  Improved ROM, swelling and effusion in right knee. SKIN: no apparent skin lesion or wound NEURO: AA.  Oriented appropriately.  No apparent focal neuro deficit. PSYCH: Calm. Normal affect.   Sch Meds:  Scheduled Meds:  acetaminophen   650 mg Oral Q6H WA   allopurinol  300 mg Oral Daily   cefadroxil  1,000 mg Oral BID   Chlorhexidine  Gluconate Cloth  6 each Topical Daily   colchicine   0.6 mg Oral Daily   dorzolamide-timolol  1 drop Both Eyes BID   enoxaparin  (LOVENOX ) injection  40 mg Subcutaneous QHS   fluticasone  1 spray Each Nare QODAY   gabapentin  200 mg Oral QHS   indomethacin  50 mg Oral TID WC   levothyroxine   50 mcg Oral Q0600   linezolid  600 mg Oral Q12H   pantoprazole   40 mg Oral Daily   tamsulosin  0.4 mg Oral Daily   Continuous Infusions:   PRN Meds:.albuterol , diphenhydrAMINE, melatonin, ondansetron  **OR** ondansetron  (ZOFRAN ) IV, oxyCODONE , [EXPIRED] polyethylene glycol **FOLLOWED BY** polyethylene glycol, [COMPLETED] senna-docusate **FOLLOWED BY** senna-docusate  Antimicrobials: Anti-infectives (From admission, onward)    Start     Dose/Rate Route Frequency Ordered Stop   07/15/24 1000  cefadroxil (DURICEF) capsule 1,000 mg        1,000 mg Oral 2 times daily 07/15/24 0754 07/19/24 0959   07/15/24 0000  cefadroxil (DURICEF) 500 MG capsule        1,000 mg Oral 2 times daily 07/15/24 0757 07/19/24 2359   07/15/24 0000  linezolid (ZYVOX) 600 MG tablet        600 mg Oral Every 12 hours 07/15/24 0757 07/19/24 2359   07/12/24  2200  linezolid (ZYVOX) tablet 600 mg        600 mg Oral Every 12 hours 07/12/24 1244 07/18/24 2359   07/10/24 0700  vancomycin (VANCOCIN) IVPB 1000 mg/200 mL premix  Status:  Discontinued       Note to Pharmacy: Administration duration and rate are locked-  unable to edit them.  Placed in Followed by Linked Group   1,000 mg 200 mL/hr over 60 Minutes Intravenous Every 12 hours 07/09/24 1759 07/12/24 0809   07/09/24 2330  cefTRIAXone (ROCEPHIN) 2 g in sodium chloride  0.9 % 100 mL IVPB  Status:  Discontinued        2 g 200 mL/hr over 30 Minutes Intravenous Every 24 hours 07/09/24 0805 07/15/24 0754   07/09/24 1900  vancomycin (VANCOREADY) IVPB 1500 mg/300 mL       Placed in Followed by Linked Group   1,500 mg 150 mL/hr over 120 Minutes Intravenous  Once 07/09/24 1759 07/09/24 2252   07/08/24 2345  cefTRIAXone (ROCEPHIN) 2 g in sodium chloride  0.9 % 100 mL IVPB        2 g  200 mL/hr over 30 Minutes Intravenous  Once 07/08/24 2337 07/09/24 0012        I have personally reviewed the following labs and images: CBC: Recent Labs  Lab 07/08/24 2131 07/09/24 0811 07/12/24 0449 07/13/24 0410 07/15/24 0420  WBC 10.9* 9.2 7.8 7.6 7.2  NEUTROABS 8.6*  --   --   --   --   HGB 14.8 14.4 12.4* 12.4* 12.4*  HCT 43.2 41.2 35.6* 36.6* 36.8*  MCV 99.1 99.3 97.8 100.0 100.5*  PLT 263 261 323 361 392   BMP &GFR Recent Labs  Lab 07/10/24 0431 07/12/24 0449 07/13/24 0410 07/14/24 0433 07/15/24 0420  NA 134* 133* 135 134* 137  K 3.7 3.7 3.8 4.1 4.4  CL 100 98 99 99 103  CO2 26 27 28 25 26   GLUCOSE 93 102* 109* 79 81  BUN 12 13 15 17 13   CREATININE 0.79 0.93 1.19 1.19 1.01  CALCIUM  8.6* 9.0 8.9 8.6* 9.0  MG  --  1.5* 1.5* 2.3 2.0  PHOS  --   --  3.1 2.7 3.0   Estimated Creatinine Clearance: 64.5 mL/min (by C-G formula based on SCr of 1.01 mg/dL). Liver & Pancreas: Recent Labs  Lab 07/08/24 2131 07/13/24 0410 07/14/24 0433 07/15/24 0420  AST 27  --   --   --   ALT 12  --   --    --   ALKPHOS 110  --   --   --   BILITOT 1.0  --   --   --   PROT 7.6  --   --   --   ALBUMIN 3.7 2.9* 2.8* 2.9*   No results for input(s): LIPASE, AMYLASE in the last 168 hours. No results for input(s): AMMONIA in the last 168 hours. Diabetic: No results for input(s): HGBA1C in the last 72 hours. No results for input(s): GLUCAP in the last 168 hours. Cardiac Enzymes: No results for input(s): CKTOTAL, CKMB, CKMBINDEX, TROPONINI in the last 168 hours. No results for input(s): PROBNP in the last 8760 hours. Coagulation Profile: No results for input(s): INR, PROTIME in the last 168 hours. Thyroid  Function Tests: No results for input(s): TSH, T4TOTAL, FREET4, T3FREE, THYROIDAB in the last 72 hours. Lipid Profile: No results for input(s): CHOL, HDL, LDLCALC, TRIG, CHOLHDL, LDLDIRECT in the last 72 hours. Anemia Panel: No results for input(s): VITAMINB12, FOLATE, FERRITIN, TIBC, IRON, RETICCTPCT in the last 72 hours. Urine analysis:    Component Value Date/Time   COLORURINE YELLOW 07/08/2024 2135   APPEARANCEUR CLOUDY (A) 07/08/2024 2135   LABSPEC 1.010 07/08/2024 2135   PHURINE 5.0 07/08/2024 2135   GLUCOSEU NEGATIVE 07/08/2024 2135   HGBUR MODERATE (A) 07/08/2024 2135   BILIRUBINUR NEGATIVE 07/08/2024 2135   KETONESUR NEGATIVE 07/08/2024 2135   PROTEINUR NEGATIVE 07/08/2024 2135   UROBILINOGEN 0.2 10/07/2014 0007   NITRITE POSITIVE (A) 07/08/2024 2135   LEUKOCYTESUR LARGE (A) 07/08/2024 2135   Sepsis Labs: Invalid input(s): PROCALCITONIN, LACTICIDVEN  Microbiology: Recent Results (from the past 240 hours)  Blood culture (routine x 2)     Status: None   Collection Time: 07/08/24 10:59 PM   Specimen: BLOOD  Result Value Ref Range Status   Specimen Description   Final    BLOOD BLOOD RIGHT ARM Performed at [facility], [address], [place], [place] [address]    Special Requests   Final     Blood Culture adequate volume BOTTLES DRAWN AEROBIC AND ANAEROBIC Performed at [facility], [address] [place], [place] [address]    Culture   Final    NO GROWTH 5 DAYS Performed at [facility], [address] [place], [place] [address]    Report Status 07/14/2024 FINAL  Final  Body fluid culture w Gram Stain     Status: None   Collection Time: 07/09/24  2:24 AM   Specimen: Synovium; Body Fluid  Result Value Ref Range Status   Specimen Description   Final    SYNOVIAL Performed at [facility], [address], [place], [place] [address]    Special Requests   Final    NONE RIGHT KNEE Performed at [facility], [address], [place], [place] [address]    Gram Stain   Final    RARE WBC PRESENT, PREDOMINANTLY PMN NO ORGANISMS SEEN    Culture   Final    NO GROWTH 3 DAYS Performed at [facility], [address], [place], [place] [address]    Report Status 07/12/2024 FINAL  Final  Blood culture (routine x 2)     Status: None   Collection Time: 07/09/24  1:40 PM   Specimen: BLOOD  Result Value Ref Range Status   Specimen Description   Final    BLOOD BLOOD LEFT ARM Performed at [facility], [address], [place], [place] [address]    Special Requests   Final    BOTTLES DRAWN AEROBIC AND ANAEROBIC Blood Culture adequate volume Performed at [facility], [address], [place], [place] [address]    Culture   Final    NO GROWTH 5 DAYS Performed at [facility], [address] [place], [place] [address]    Report Status 07/14/2024 FINAL  Final    Radiology Studies: No results found.     Zacaria Pousson T. Rehman Levinson [facility]  If 7PM-7AM, please contact night-coverage [email or web address] 07/15/2024, 11:20 AM

## 2024-07-15 NOTE — TOC Progression Note (Addendum)
 Transition of Care [facility]) - Progression Note    Patient Details  Name: Ross Becker MRN: [id number] Date of Birth: [birth year]  Transition of [facility] CM/SW Contact  [name], RN Phone Number: 07/15/2024, 9:39 AM  Clinical Narrative:    [facility]. CM have [facility] again to [facility] and called requesting a confirmation of received. 10:45 AM Insurance auth started in [place] [id number]. 12:20 PM Insurance Auth still pending 1:35 PM Auth still pending. 3:04 PM Auth still pending. Provider, nurse and [facility] SNF updated.  Expected Discharge Plan: Home w Home Health Services Barriers to Discharge: Continued Medical Work up               Expected Discharge Plan and Services In-house Referral: NA Discharge Planning Services: NA Post Acute Care Choice: NA Living arrangements for the past 2 months: Apartment Expected Discharge Date: 07/15/24               DME Arranged: N/A DME Agency: NA       HH Arranged: NA HH Agency: NA         Social Drivers of Health (SDOH) Interventions SDOH Screenings   Food Insecurity: No Food Insecurity (07/09/2024)  Housing: Low Risk  (07/09/2024)  Transportation Needs: No Transportation Needs (07/09/2024)  Utilities: Not At Risk (07/09/2024)  Social Connections: Socially Isolated (07/09/2024)  Tobacco Use: Low Risk  (07/08/2024)    Readmission Risk Interventions    07/10/2024   10:28 AM  Readmission Risk Prevention Plan  Post Dischage Appt Complete  Medication Screening Complete  Transportation Screening Complete

## 2024-07-15 NOTE — Plan of Care (Signed)

## 2024-07-15 NOTE — Consult Note (Signed)
 [facility]    Date of Admission:  07/08/2024   Total days of inpatient antibiotics 5        Reason for Consult: MSSA urine    Principal Problem:   Catheter-associated urinary tract infection Active Problems:   Essential hypertension   BPH (benign prostatic hyperplasia)   Right knee pain   Hypokalemia   Hypothyroidism   GERD (gastroesophageal reflux disease)   UTI (urinary tract infection)   Assessment: 78 year old male with history of gout, CAD, arthritis, hypertension, BPH/urinary retention self caths initially presented to ED on 11/7 due to self cath difficulties urine cultures grew MSSA, patient did not start Keflex  for 1 day prior to hospitalization.  He had presented with knee pain x-ray just showed some small suprapatellar knee joint effusion.  ID was engaged due to concern for possible disseminated infection #Small joint effusion likely inflammatory #Urine cultures MSSA - Orthopedics engaged, knee aspirate with 50 WBC, no crystals.  Cultures no growth so far - Knee on exam is without any erythema or concern for infection - Blood cultures from admission are negative.  No blood cultures obtained during ED visit.  Recommendations:  -TTE.  Communicated with primary. - If TTE is negative okay to switch to p.o. cefadroxil  linezolid  to complete total 10 days of antibiotics for possible cellulitis.  Patient only took 1 dose of Keflex  prior to hospitalization as such would not have suppressed blood cultures on disseminated infection.  Knee is without any signs of cellulitis/infection today.  Knee aspirate cultures no growth, no crystals, WBC 50K which could be inflammatory.  Agree with orthopedics does not seem infectious. - Standard precautions  Microbiology:   Antibiotics: Ceftriaxone  11/12-present Linezolid  11/16-present 's vancomycin  11/13-15  Cultures: Blood 11/12 no growth Urine 11/7 MSSA Other   HPI: Ross Becker is a  78 y.o. male past medical history of CAD, hypertension and BPH/urinary retention, intermittent self caths, GERD, hypothyroidism and arthritis, gout had initially presented the ED on 11/7 due to having difficulty with straight cath.  He had a white cell count of 12K.  No blood cultures taken during the visit but urine cultures grew MSSA.  Patient was given Keflex  but he did not started until much later.  Only took 1 dose of levels prior to hospitalization.  On arrival to this visit with complaint of knee pain and some chills he had a WBC 10K.  Afebrile.  Blood cultures no growth. .  X-ray of knee showed no acute findings, small suprapatellar knee joint effusion.  Orthopedics engaged patient underwent knee aspiration with 50K WBC, no crystals seen.  He has been on ceftriaxone  and vancomycin  then transition to linezolid .  ID engaged given concern for possibly disseminated infection given urine cultures grew MSSA   Review of Systems: Review of Systems  All other systems reviewed and are negative.   Past Medical History:  Diagnosis Date   Arthritis    Balance problems    Cervicalgia    Diverticulitis    Enlarged prostate    GERD (gastroesophageal reflux disease)    Gout    Hypertension    Hypothyroidism    Liver lesion     Social History   Tobacco Use   Smoking status: Never   Smokeless tobacco: Never  Vaping Use   Vaping status: Never Used  Substance Use Topics   Alcohol use: Yes    Comment: occasionally   Drug use: No  Family History  Problem Relation Age of Onset   Brain cancer Mother    Cancer Father    Scheduled Meds:  acetaminophen   650 mg Oral Q6H WA   Chlorhexidine  Gluconate Cloth  6 each Topical Daily   dorzolamide -timolol   1 drop Both Eyes BID   enoxaparin  (LOVENOX ) injection  40 mg Subcutaneous QHS   fluticasone   1 spray Each Nare QODAY   gabapentin   200 mg Oral QHS   levothyroxine   50 mcg Oral Q0600   linezolid   600 mg Oral Q12H   naproxen   500 mg Oral BID WC    pantoprazole   40 mg Oral Daily   tamsulosin   0.4 mg Oral Daily   Continuous Infusions:  cefTRIAXone  (ROCEPHIN )  IV 2 g (07/14/24 2305)   PRN Meds:.albuterol , diphenhydrAMINE , melatonin, ondansetron  **OR** ondansetron  (ZOFRAN ) IV, oxyCODONE , [EXPIRED] polyethylene glycol **FOLLOWED BY** polyethylene glycol, [COMPLETED] senna-docusate **FOLLOWED BY** senna-docusate Allergies  Allergen Reactions   Mushroom Extract Complex (Obsolete)     Avoids due to gout   Penicillins Other (See Comments)    childhood allergy    OBJECTIVE: Blood pressure 110/70, pulse 79, temperature 98.4 F (36.9 C), temperature source Oral, resp. rate 16, height 5' 11 (1.803 m), weight 74.4 kg, SpO2 100%.  Physical Exam Constitutional:      General: He is not in acute distress.    Appearance: He is normal weight. He is not toxic-appearing.  HENT:     Head: Normocephalic and atraumatic.     Right Ear: External ear normal.     Left Ear: External ear normal.     Nose: No congestion or rhinorrhea.     Mouth/Throat:     Mouth: Mucous membranes are moist.     Pharynx: Oropharynx is clear.  Eyes:     Extraocular Movements: Extraocular movements intact.     Conjunctiva/sclera: Conjunctivae normal.     Pupils: Pupils are equal, round, and reactive to light.  Cardiovascular:     Rate and Rhythm: Normal rate and regular rhythm.     Heart sounds: No murmur heard.    No friction rub. No gallop.  Pulmonary:     Effort: Pulmonary effort is normal.     Breath sounds: Normal breath sounds.  Abdominal:     General: Abdomen is flat. Bowel sounds are normal.     Palpations: Abdomen is soft.  Musculoskeletal:        General: No swelling. Normal range of motion.     Cervical back: Normal range of motion and neck supple.  Skin:    General: Skin is warm and dry.  Neurological:     General: No focal deficit present.     Mental Status: He is oriented to person, place, and time.  Psychiatric:        Mood and Affect:  Mood normal.     Lab Results Lab Results  Component Value Date   WBC 7.2 07/15/2024   HGB 12.4 (L) 07/15/2024   HCT 36.8 (L) 07/15/2024   MCV 100.5 (H) 07/15/2024   PLT 392 07/15/2024    Lab Results  Component Value Date   CREATININE 1.01 07/15/2024   BUN 13 07/15/2024   NA 137 07/15/2024   K 4.4 07/15/2024   CL 103 07/15/2024   CO2 26 07/15/2024    Lab Results  Component Value Date   ALT 12 07/08/2024   AST 27 07/08/2024   ALKPHOS 110 07/08/2024   BILITOT 1.0 07/08/2024  Loney Stank, MD [facility] 07/15/2024, 5:33 AM Evaluation of this patient requires complex antimicrobial therapy evaluation and counseling + isolation needs for disease transmission risk assessment and mitigation

## 2024-07-16 DIAGNOSIS — N400 Enlarged prostate without lower urinary tract symptoms: Secondary | ICD-10-CM | POA: Diagnosis not present

## 2024-07-16 DIAGNOSIS — M25561 Pain in right knee: Secondary | ICD-10-CM | POA: Diagnosis not present

## 2024-07-16 DIAGNOSIS — E876 Hypokalemia: Secondary | ICD-10-CM | POA: Diagnosis not present

## 2024-07-16 DIAGNOSIS — K219 Gastro-esophageal reflux disease without esophagitis: Secondary | ICD-10-CM | POA: Diagnosis not present

## 2024-07-16 NOTE — TOC Transition Note (Signed)
 Transition of Care [facility]) - Discharge Note   Patient Details  Name: Ross Becker MRN: [id number] Date of Birth: [birth year]  [facility] CM/SW Contact:  [name], RN Phone Number: 07/16/2024, 9:24 AM   Clinical Narrative:    Per MD patient ready for DC to [facility]. RN to call report prior to discharge [phone] Rm [phone]). RN, patient, and facility notified of DC. Discharge Summary and FL2 sent to facility via HUB. DC packet on chart includes face sheet, medical necessity, 1 prescription, and discharge summary. Ambulance [facility] transport requested for patient.  Please consult us  again if new needs arise.  Final next level of care: Skilled Nursing Facility Barriers to Discharge: Barriers Resolved   Patient Goals and CMS Choice Patient states their goals for this hospitalization and ongoing recovery are:: To [facility] CMS Medicare.gov Compare Post Acute Care list provided to:: Patient Choice offered to / list presented to : Patient [place] ownership interest in [facility].provided to:: Patient    Discharge Placement   Existing PASRR number confirmed : 07/16/24          Patient chooses bed at: [facility] Patient to be transferred to facility by: [facility] Name of family member notified: Patient will call friend Patient and family notified of of transfer: 07/16/24  Discharge Plan and Services Additional resources added to the After Visit Summary for   In-house Referral: NA Discharge Planning Services: NA Post Acute Care Choice: NA          DME Arranged: N/A DME Agency: NA       HH Arranged: NA HH Agency: NA        Social Drivers of Health (SDOH) Interventions SDOH Screenings   Food Insecurity: No Food Insecurity (07/09/2024)  Housing: Low Risk  (07/09/2024)  Transportation Needs: No Transportation Needs (07/09/2024)  Utilities: Not At Risk (07/09/2024)  Social Connections: Socially Isolated (07/09/2024)  Tobacco Use: Low  Risk  (07/08/2024)     Readmission Risk Interventions    07/10/2024   10:28 AM  Readmission Risk Prevention Plan  Post Dischage Appt Complete  Medication Screening Complete  Transportation Screening Complete

## 2024-07-16 NOTE — Discharge Summary (Signed)
 Physician Discharge Summary  Ross Becker FMW:[id number] DOB: 01-Mar-1946 DOA: 07/08/2024  PCP: [facility]  Admit date: 07/08/2024 Discharge date: 07/16/24  Admitted From: Home Disposition: SNF Recommendations for Outpatient Follow-up:  Outpatient follow-up with urology in 1 to 2 weeks Outpatient follow-up with ID for repeat blood culture early next month.  ID will arrange. Check CMP and CBC Please follow up on the following pending results: None   Discharge Condition: Stable CODE STATUS: Full code Diet Orders (From admission, onward)     Start     Ordered   07/09/24 0804  Diet Heart Room service appropriate? Yes; Fluid consistency: Thin  Diet effective now       Question Answer Comment  Room service appropriate? Yes   Fluid consistency: Thin      07/09/24 0804             Contact information for follow-up providers     Genelle Elspeth, MD Follow up.   Specialty: Orthopedic Surgery Contact information: [address] [place] [address] [phone]              Contact information for after-discharge care     Destination     [facility] .   Service: Skilled Nursing Contact information: [address] [place]  [address] [phone]                     Hospital course 78 year old M with PMH of CAD, HTN, BPH/urinary tension with intermittent self cath, GERD, hypothyroidism, arthritis and gout presented to ED with fever, weakness, right knee pain and swelling, and admitted with catheter associated urinary tract infection and right knee pain with effusion.  Patient had indwelling Foley catheter placed during his recent visit to ED on 11/7 due to having difficulty with straight cath.    In ED, stable vitals.  WBC 10.9.  Otherwise, CMP and CBC without significant finding.  Lactic acid negative.  UA with pyuria, bacteriuria and nitrites.  Blood culture sent.  Started on ceftriaxone  and  vancomycin .  Orthopedic surgery consulted.   Patient had arthrocentesis of right knee on 11/13.  Synovial fluid with 50K WBC with 96% neutrophils.  No crystals.  Blood and synovial fluid cultures NGTD.  Swelling, pain and range of motion improved after starting naproxen .  ID consulted and ordered echocardiogram given history of MSSA UTI.  TTE without significant finding but acoustic window did not allow full evaluation for endocarditis.  Disseminated MSSA infection felt to be less likely.  ID recommended completing 10 days of antibiotics with cefadroxil  and Zyvox  which she finished on 07/23/2024.[name]   Patient was physically deconditioned in the setting of acute illness.  Therapy recommended SNF.  Medically stable for discharge.  See individual problem list below for more.   Problems addressed during this hospitalization Catheter associated urinary tract infection-UA with pyuria, bacteriuria and nitrite.  Unfortunately, urine culture was not sent.  Prior urine culture on 11/7 with MSSA.  No history of MDR.  Foley catheter exchanged in ED. TTE as above.  Disseminated MSSA infection felt to be unlikely. -Continue Zyvox  and ceftriaxone  and complete total of 10 days course with p.o. Zyvox  and cefadroxil  until 11/27. -ID signed off.   Right knee pain/effusion: No report of trauma. S/p arthrocentesis on 11/13.  Synovial fluid with 50,000 WBC with 96% neutrophils but no crystals.  Cultures NGTD.  Uric acid only 5.5.  Improved pain, effusion, swelling and ROM after starting naproxen .  Low  suspicion for infection per orthopedic surgery (discussed with Dr.  Genelle on 11/17) -Antibiotics as above. -Discontinue naproxen  and start home indomethacin  -Continue home colchicine  and allopurinol    Acute urinary retention with BPH (POA) -Foley catheter exchanged on presentation.   -Flomax  on board. -Outpatient follow-up with urology.   Essential hypertension: Normotensive.  Enalapril  discontinued due to  hypotension.   Hypokalemia/hypomagnesemia: Resolved.   Hypothyroidism -Continue home Synthroid .   Generalized weakness/physical deconditioning -Therapy recommending STR.    Body mass index is 22.88 kg/m.           Consultations: Orthopedic surgery Infectious disease  Time spent 35  minutes  Vital signs Vitals:   07/15/24 0501 07/15/24 1246 07/15/24 2022 07/16/24 0502  BP: 110/70 139/82 126/79 130/80  Pulse: 79 76 83 79  Temp: 98.4 F (36.9 C) (!) 97.4 F (36.3 C) (!) 97.5 F (36.4 C) 97.7 F (36.5 C)  Resp: 16 18 16 16   Height:      Weight:      SpO2: 100% 100% 100% 97%  TempSrc: Oral Oral    BMI (Calculated):         Discharge exam  GENERAL: No apparent distress.  Nontoxic. HEENT: MMM.  Vision and hearing grossly intact.  NECK: Supple.  No apparent JVD.  RESP:  No IWOB.  Fair aeration bilaterally. CVS:  RRR. Heart sounds normal.  ABD/GI/GU: BS+. Abd soft, NTND.  Foley catheter in place. MSK/EXT:  Moves extremities.  Improved ROM, swelling and effusion in right knee. SKIN: no apparent skin lesion or wound NEURO: AA.  Oriented appropriately.  No apparent focal neuro deficit. PSYCH: Calm. Normal affect.   Discharge Instructions Discharge Instructions     Increase activity slowly   Complete by: As directed       Allergies as of 07/16/2024       Reactions   Mushroom Extract Complex (obsolete)    Avoids due to gout   Penicillins Other (See Comments)   childhood allergy        Medication List     STOP taking these medications    cephALEXin  500 MG capsule Commonly known as: KEFLEX    enalapril  20 MG tablet Commonly known as: VASOTEC    HYDROcodone -acetaminophen  5-325 MG tablet Commonly known as: NORCO/VICODIN   loperamide 2 MG capsule Commonly known as: IMODIUM   omeprazole 20 MG capsule Commonly known as: PRILOSEC   oxyCODONE -acetaminophen  5-325 MG tablet Commonly known as: PERCOCET/ROXICET   predniSONE  10 MG tablet Commonly  known as: DELTASONE    terazosin  2 MG capsule Commonly known as: HYTRIN        TAKE these medications    acetaminophen  325 MG tablet Commonly known as: TYLENOL  Take 2 tablets (650 mg total) by mouth every 6 (six) hours.   albuterol  108 (90 Base) MCG/ACT inhaler Commonly known as: VENTOLIN  HFA Inhale 1-2 puffs into the lungs every 6 (six) hours as needed for wheezing or shortness of breath.   allopurinol  300 MG tablet Commonly known as: ZYLOPRIM  Take 300 mg by mouth daily.   cefadroxil  500 MG capsule Commonly known as: DURICEF Take 2 capsules (1,000 mg total) by mouth 2 (two) times daily for 4 days.   cholecalciferol 1000 units tablet Commonly known as: VITAMIN D Take 2,000 Units by mouth daily.   colchicine  0.6 MG tablet TAKE TWO TABLETS BY MOUTH ONCE AT ONSET -  MAY TAKE ONE TABLET ONE HOUR LATER IF NEEDED (MAXIMUM OF 3 TABLETS IN 3 DAYS)   cyanocobalamin 1000 MCG tablet Take 1,000  mcg by mouth in the morning and at bedtime.   dorzolamide-timolol 2-0.5 % ophthalmic solution Commonly known as: COSOPT Place 1 drop into both eyes 2 (two) times daily.   fluticasone 50 MCG/ACT nasal spray Commonly known as: FLONASE Place 1 spray into both nostrils every other day.   gabapentin 100 MG capsule Commonly known as: NEURONTIN Take 200 mg by mouth at bedtime.   indomethacin 50 MG capsule Commonly known as: INDOCIN Take 50 mg by mouth 3 (three) times daily with meals.   levothyroxine  50 MCG tablet Commonly known as: SYNTHROID  Take 50 mcg by mouth daily before breakfast.   lidocaine  5 % Commonly known as: Lidoderm  Place 1 patch onto the skin daily. Remove & Discard patch within 12 hours or as directed by MD   linezolid 600 MG tablet Commonly known as: ZYVOX Take 1 tablet (600 mg total) by mouth every 12 (twelve) hours for 4 days.   ondansetron  4 MG disintegrating tablet Commonly known as: Zofran  ODT Take 1 tablet (4 mg total) by mouth every 8 (eight) hours as needed  for up to 15 doses for nausea or vomiting.   pantoprazole  40 MG tablet Commonly known as: PROTONIX  Take 40 mg by mouth daily.   polyethylene glycol powder 17 GM/SCOOP powder Commonly known as: MiraLax Take 17 g by mouth 2 (two) times daily as needed for moderate constipation.   senna-docusate 8.6-50 MG tablet Commonly known as: Senokot-S Take 1 tablet by mouth 2 (two) times daily between meals as needed for mild constipation.   simethicone 80 MG chewable tablet Commonly known as: MYLICON Chew 80 mg by mouth as needed for flatulence.   tamsulosin 0.4 MG Caps capsule Commonly known as: FLOMAX Take 0.4 mg by mouth daily.         Procedures/Studies: 11/13-right knee arthrocentesis    ECHOCARDIOGRAM COMPLETE Result Date: 07/14/2024    ECHOCARDIOGRAM REPORT   Patient Name:   Ross Becker [facility] Date of Exam: 07/14/2024 Medical Rec #:  [id number]           Height:       71.0 in Accession #:    [id number]          Weight:       164.0 lb Date of Birth:  1946/01/03           BSA:          1.938 m Patient Age:    78 years            BP:           115/73 mmHg Patient Gender: M                   HR:           74 bpm. Exam Location:  Inpatient Procedure: 2D Echo (Both Spectral and Color Flow Doppler were utilized during            procedure). Indications:    Endocarditis  History:        Patient has prior history of Echocardiogram examinations.                 Signs/Symptoms:Bacteremia.  Sonographer:    Norleen Amour Referring Phys: [id number] [facility] IMPRESSIONS  1. Acoustic windows do not allow full evaluation for endocarditis. Recommend TEE if clinically indicated.  2. Left ventricular ejection fraction, by estimation, is 60 to 65%. The left ventricle has normal function. The left ventricle has no regional wall motion abnormalities.  Left ventricular diastolic parameters were normal.  3. Right ventricular systolic function is normal. The right ventricular size is normal. There is normal  pulmonary artery systolic pressure.  4. The mitral valve is normal in structure. Trivial mitral valve regurgitation.  5. The aortic valve is tricuspid. Aortic valve regurgitation is not visualized. FINDINGS  Left Ventricle: Left ventricular ejection fraction, by estimation, is 60 to 65%. The left ventricle has normal function. The left ventricle has no regional wall motion abnormalities. The left ventricular internal cavity size was normal in size. There is  no left ventricular hypertrophy. Left ventricular diastolic parameters were normal. Right Ventricle: The right ventricular size is normal. Right vetricular wall thickness was not assessed. Right ventricular systolic function is normal. There is normal pulmonary artery systolic pressure. The tricuspid regurgitant velocity is 2.29 m/s, and with an assumed right atrial pressure of 8 mmHg, the estimated right ventricular systolic pressure is 29.0 mmHg. Left Atrium: Left atrial size was normal in size. Right Atrium: Right atrial size was normal in size. Pericardium: There is no evidence of pericardial effusion. Mitral Valve: The mitral valve is normal in structure. Trivial mitral valve regurgitation. MV peak gradient, 4.8 mmHg. The mean mitral valve gradient is 2.0 mmHg. Tricuspid Valve: The tricuspid valve is normal in structure. Tricuspid valve regurgitation is mild. Aortic Valve: The aortic valve is tricuspid. Aortic valve regurgitation is not visualized. Pulmonic Valve: The pulmonic valve was normal in structure. Pulmonic valve regurgitation is not visualized. Aorta: The aortic root and ascending aorta are structurally normal, with no evidence of dilitation. IAS/Shunts: No atrial level shunt detected by color flow Doppler.  LEFT VENTRICLE PLAX 2D LVIDd:         4.20 cm     Diastology LVIDs:         2.50 cm     LV e' medial:    7.62 cm/s LV PW:         0.80 cm     LV E/e' medial:  11.2 LV IVS:        0.90 cm     LV e' lateral:   10.20 cm/s LVOT diam:     2.10 cm      LV E/e' lateral: 8.4 LV SV:         66 LV SV Index:   34 LVOT Area:     3.46 cm LV IVRT:       66 msec  LV Volumes (MOD) LV vol d, MOD A2C: 94.9 ml LV vol d, MOD A4C: 90.5 ml LV vol s, MOD A2C: 34.5 ml LV vol s, MOD A4C: 34.5 ml LV SV MOD A2C:     60.4 ml LV SV MOD A4C:     90.5 ml LV SV MOD BP:      64.3 ml RIGHT VENTRICLE RV Basal diam:  4.50 cm     PULMONARY VEINS RV S prime:     14.00 cm/s  Diastolic Velocity: 48.80 cm/s TAPSE (M-mode): 1.9 cm      S/D Velocity:       1.20                             Systolic Velocity:  60.00 cm/s LEFT ATRIUM             Index        RIGHT ATRIUM           Index LA diam:  3.40 cm 1.75 cm/m   RA Area:     10.20 cm LA Vol (A2C):   34.5 ml 17.80 ml/m  RA Volume:   18.60 ml  9.60 ml/m LA Vol (A4C):   30.2 ml 15.58 ml/m LA Biplane Vol: 33.9 ml 17.49 ml/m  AORTIC VALVE             PULMONIC VALVE LVOT Vmax:   88.80 cm/s  PV Vmax:       0.80 m/s LVOT Vmean:  63.700 cm/s PV Peak grad:  2.6 mmHg LVOT VTI:    0.190 m  AORTA Ao Root diam: 3.30 cm Ao Asc diam:  3.20 cm MITRAL VALVE               TRICUSPID VALVE MV Area (PHT): 4.54 cm    TR Peak grad:   21.0 mmHg MV Area VTI:   2.59 cm    TR Vmax:        229.00 cm/s MV Peak grad:  4.8 mmHg MV Mean grad:  2.0 mmHg    SHUNTS MV Vmax:       1.09 m/s    Systemic VTI:  0.19 m MV Vmean:      69.1 cm/s   Systemic Diam: 2.10 cm MV Decel Time: 167 msec MV E velocity: 85.30 cm/s MV A velocity: 71.60 cm/s MV E/A ratio:  1.19 Vina Gull MD Electronically signed by Vina Gull MD Signature Date/Time: 07/14/2024/1:34:35 PM    Final    CT ABDOMEN WO CONTRAST Result Date: 07/09/2024 EXAM: CT ABDOMEN WITHOUT CONTRAST 07/09/2024 04:13:44 PM TECHNIQUE: CT of the abdomen was performed without the administration of intravenous contrast. Multiplanar reformatted images are provided for review. Automated exposure control, iterative reconstruction, and/or weight based adjustment of the mA/kV was utilized to reduce the radiation dose to as low as  reasonably achievable. COMPARISON: 05/01/2014 CLINICAL HISTORY: Flank pain, possible pyelonephritis. FINDINGS: LOWER CHEST: Lung bases are within normal limits. No focal infiltrate is seen. Some calcified pleural plaques are again seen. HEPATOBILIARY: Gallbladder has been surgically removed. Liver is within normal limits. SPLEEN: Spleen demonstrates no acute abnormality. PANCREAS: Pancreas demonstrates no acute abnormality. ADRENAL GLANDS: Adrenal glands demonstrate no acute abnormality. KIDNEYS: The left kidney is not visualized, consistent with congenital absence, stable from the prior exam. The right kidney demonstrates no hydronephrosis or renal calculi. No focal mass is seen. No findings to suggest pyelonephritis are noted. GI AND BOWEL: The colon demonstrates mild diverticulosis without diverticulitis. The stomach and small bowel are within normal limits. There is no bowel obstruction. PERITONEUM AND RETROPERITONEUM: No ascites or free air. Atherosclerotic calcifications of the aorta are noted without aneurysmal dilatation. LYMPH NODES: No lymphadenopathy. BONES AND SOFT TISSUES: Degenerative changes of the lumbar spine are seen. No acute bony abnormality is noted. IMPRESSION: 1. No CT evidence of pyelonephritis. 2. Congenital absence of the left kidney, stable from the prior exam. 3. Aortic atherosclerosis without aneurysmal dilatation. Electronically signed by: Oneil Devonshire MD 07/09/2024 07:34 PM EST RP Workstation: [id number]   DG Knee Complete 4 Views Right Result Date: 07/08/2024 EXAM: 4 VIEW(S) XRAY OF THE RIGHT KNEE 07/08/2024 11:33:00 PM COMPARISON: None available. CLINICAL HISTORY: swelling and pain FINDINGS: BONES AND JOINTS: No acute fracture. No focal osseous lesion. No joint dislocation. Small suprapatellar knee joint effusion. Very mild tricompartmental degenerative changes. SOFT TISSUES: The soft tissues are unremarkable. IMPRESSION: 1. No acute findings. 2. Small suprapatellar knee joint  effusion. Electronically signed by: Pinkie Pebbles MD 07/08/2024 11:35 PM EST  RP Workstation: [id number]   DG Ribs Unilateral W/Chest Right Result Date: 07/03/2024 CLINICAL DATA:  Fall EXAM: RIGHT RIBS AND CHEST - 3+ VIEW COMPARISON:  08/05/2019 FINDINGS: Single view chest demonstrates no focal opacity or pleural effusion. Normal cardiac size. No pneumothorax. Right rib series demonstrates no definitive displaced right rib fracture., question erosion right eleventh and twelfth ribs. IMPRESSION: 1. No active cardiopulmonary disease. 2. No definitive displaced right rib fracture. Question erosion of the right eleventh and twelfth ribs. This may be correlated with chest CT Electronically Signed   By: Luke Bun M.D.   On: 07/03/2024 22:26       The results of significant diagnostics from this hospitalization (including imaging, microbiology, ancillary and laboratory) are listed below for reference.     Microbiology: Recent Results (from the past 240 hours)  Blood culture (routine x 2)     Status: None   Collection Time: 07/08/24 10:59 PM   Specimen: BLOOD  Result Value Ref Range Status   Specimen Description   Final    BLOOD BLOOD RIGHT ARM Performed at [facility], [address], [place], [place] [address]    Special Requests   Final    Blood Culture adequate volume BOTTLES DRAWN AEROBIC AND ANAEROBIC Performed at [facility], [address], [place], [place] [address]    Culture   Final    NO GROWTH 5 DAYS Performed at [facility], [address], [place], [place] [address]    Report Status 07/14/2024 FINAL  Final  Body fluid culture w Gram Stain     Status: None   Collection Time: 07/09/24  2:24 AM   Specimen: Synovium; Body Fluid  Result Value Ref Range Status   Specimen Description   Final    SYNOVIAL Performed at [facility], [address], [place], [place] [address]    Special Requests   Final     NONE RIGHT KNEE Performed at [facility], [address] [place], [place] [address]    Gram Stain   Final    RARE WBC PRESENT, PREDOMINANTLY PMN NO ORGANISMS SEEN    Culture   Final    NO GROWTH 3 DAYS Performed at [facility], [address], [place], [place] [address]    Report Status 07/12/2024 FINAL  Final  Blood culture (routine x 2)     Status: None   Collection Time: 07/09/24  1:40 PM   Specimen: BLOOD  Result Value Ref Range Status   Specimen Description   Final    BLOOD BLOOD LEFT ARM Performed at [facility], [address] [place], [place] [address]    Special Requests   Final    BOTTLES DRAWN AEROBIC AND ANAEROBIC Blood Culture adequate volume Performed at [facility], [address], [place], [place] [address]    Culture   Final    NO GROWTH 5 DAYS Performed at [facility], [address] [place], [place] [address]    Report Status 07/14/2024 FINAL  Final     Labs:  CBC: Recent Labs  Lab 07/09/24 0811 07/12/24 0449 07/13/24 0410 07/15/24 0420  WBC 9.2 7.8 7.6 7.2  HGB 14.4 12.4* 12.4* 12.4*  HCT 41.2 35.6* 36.6* 36.8*  MCV 99.3 97.8 100.0 100.5*  PLT 261 323 361 392   BMP &GFR Recent Labs  Lab 07/10/24 0431 07/12/24 0449 07/13/24 0410 07/14/24 0433 07/15/24 0420  NA 134* 133* 135 134* 137  K 3.7 3.7 3.8 4.1 4.4  CL 100 98 99 99 103  CO2 26 27 28 25 26   GLUCOSE 93 102* 109* 79 81  BUN 12 13 15 17 13   CREATININE 0.79 0.93 1.19 1.19 1.01  CALCIUM  8.6* 9.0 8.9 8.6* 9.0  MG  --  1.5* 1.5* 2.3 2.0  PHOS  --   --  3.1 2.7 3.0   Estimated Creatinine Clearance: 64.5 mL/min (by C-G formula based on SCr of 1.01 mg/dL). Liver & Pancreas: Recent Labs  Lab 07/13/24 0410 07/14/24 0433 07/15/24 0420  ALBUMIN 2.9* 2.8* 2.9*   No results for input(s): LIPASE, AMYLASE in the last 168 hours. No results for input(s): AMMONIA in the last 168 hours. Diabetic: No  results for input(s): HGBA1C in the last 72 hours. No results for input(s): GLUCAP in the last 168 hours. Cardiac Enzymes: No results for input(s): CKTOTAL, CKMB, CKMBINDEX, TROPONINI in the last 168 hours. No results for input(s): PROBNP in the last 8760 hours. Coagulation Profile: No results for input(s): INR, PROTIME in the last 168 hours. Thyroid  Function Tests: No results for input(s): TSH, T4TOTAL, FREET4, T3FREE, THYROIDAB in the last 72 hours. Lipid Profile: No results for input(s): CHOL, HDL, LDLCALC, TRIG, CHOLHDL, LDLDIRECT in the last 72 hours. Anemia Panel: No results for input(s): VITAMINB12, FOLATE, FERRITIN, TIBC, IRON, RETICCTPCT in the last 72 hours. Urine analysis:    Component Value Date/Time   COLORURINE YELLOW 07/08/2024 2135   APPEARANCEUR CLOUDY (A) 07/08/2024 2135   LABSPEC 1.010 07/08/2024 2135   PHURINE 5.0 07/08/2024 2135   GLUCOSEU NEGATIVE 07/08/2024 2135   HGBUR MODERATE (A) 07/08/2024 2135   BILIRUBINUR NEGATIVE 07/08/2024 2135   KETONESUR NEGATIVE 07/08/2024 2135   PROTEINUR NEGATIVE 07/08/2024 2135   UROBILINOGEN 0.2 10/07/2014 0007   NITRITE POSITIVE (A) 07/08/2024 2135   LEUKOCYTESUR LARGE (A) 07/08/2024 2135   Sepsis Labs: Invalid input(s): PROCALCITONIN, LACTICIDVEN   SIGNED:  Jaima Janney T Sofie Schendel, MD  [facility] 07/16/2024, 8:00 AM

## 2024-07-16 NOTE — TOC Progression Note (Addendum)
 Transition of Care [facility]) - Progression Note    Patient Details  Name: Ross Becker MRN: [id number] Date of Birth: [birth year]  [facility]) CM/SW Contact  [name], RN Phone Number: 07/16/2024, 8:30 AM  Clinical Narrative:    Insurance auth still pending. CM called [employer] to ask if any documents were needed. No additional documentation required. CM received a call from [place] stating that faxed received for SNF and it may a couple of days before review. CM received call from [employer] that patient has been approved Plan Auth ID [id number], Next eval 11/21, Allean RAMAN. Care Coordinator.  Expected Discharge Plan: Home w Home Health Services Barriers to Discharge: Insurance Authorization               Expected Discharge Plan and Services In-house Referral: NA Discharge Planning Services: NA Post Acute Care Choice: NA Living arrangements for the past 2 months: Apartment Expected Discharge Date: 07/16/24               DME Arranged: N/A DME Agency: NA       HH Arranged: NA HH Agency: NA         Social Drivers of Health (SDOH) Interventions SDOH Screenings   Food Insecurity: No Food Insecurity (07/09/2024)  Housing: Low Risk  (07/09/2024)  Transportation Needs: No Transportation Needs (07/09/2024)  Utilities: Not At Risk (07/09/2024)  Social Connections: Socially Isolated (07/09/2024)  Tobacco Use: Low Risk  (07/08/2024)    Readmission Risk Interventions    07/10/2024   10:28 AM  Readmission Risk Prevention Plan  Post Dischage Appt Complete  Medication Screening Complete  Transportation Screening Complete

## 2024-07-21 DIAGNOSIS — I10 Essential (primary) hypertension: Secondary | ICD-10-CM | POA: Diagnosis not present

## 2024-07-21 DIAGNOSIS — R2689 Other abnormalities of gait and mobility: Secondary | ICD-10-CM | POA: Diagnosis not present

## 2024-07-21 DIAGNOSIS — N390 Urinary tract infection, site not specified: Secondary | ICD-10-CM | POA: Diagnosis not present

## 2024-07-21 DIAGNOSIS — N400 Enlarged prostate without lower urinary tract symptoms: Secondary | ICD-10-CM | POA: Diagnosis not present

## 2024-07-29 ENCOUNTER — Inpatient Hospital Stay: Admitting: Internal Medicine
